# Patient Record
Sex: Female | Born: 1985 | Race: White | Hispanic: No | Marital: Married | Smoking: Former smoker
Health system: Southern US, Community
[De-identification: ages and names within clinical notes are randomized; demographics above are authoritative.]

## PROBLEM LIST (undated history)

## (undated) ENCOUNTER — Inpatient Hospital Stay (HOSPITAL_COMMUNITY): Payer: Self-pay

## (undated) ENCOUNTER — Emergency Department (HOSPITAL_COMMUNITY): Payer: 59

## (undated) ENCOUNTER — Inpatient Hospital Stay (HOSPITAL_COMMUNITY): Payer: Managed Care, Other (non HMO)

## (undated) DIAGNOSIS — Z87898 Personal history of other specified conditions: Secondary | ICD-10-CM

## (undated) DIAGNOSIS — Z800 Family history of malignant neoplasm of digestive organs: Secondary | ICD-10-CM

## (undated) DIAGNOSIS — Z9221 Personal history of antineoplastic chemotherapy: Secondary | ICD-10-CM

## (undated) DIAGNOSIS — T4145XA Adverse effect of unspecified anesthetic, initial encounter: Secondary | ICD-10-CM

## (undated) DIAGNOSIS — K589 Irritable bowel syndrome without diarrhea: Secondary | ICD-10-CM

## (undated) DIAGNOSIS — G44009 Cluster headache syndrome, unspecified, not intractable: Secondary | ICD-10-CM

## (undated) DIAGNOSIS — T8859XA Other complications of anesthesia, initial encounter: Secondary | ICD-10-CM

## (undated) DIAGNOSIS — Z853 Personal history of malignant neoplasm of breast: Secondary | ICD-10-CM

## (undated) DIAGNOSIS — E0590 Thyrotoxicosis, unspecified without thyrotoxic crisis or storm: Secondary | ICD-10-CM

## (undated) DIAGNOSIS — Z803 Family history of malignant neoplasm of breast: Secondary | ICD-10-CM

## (undated) DIAGNOSIS — N61 Mastitis without abscess: Secondary | ICD-10-CM

## (undated) DIAGNOSIS — M329 Systemic lupus erythematosus, unspecified: Secondary | ICD-10-CM

## (undated) DIAGNOSIS — C801 Malignant (primary) neoplasm, unspecified: Secondary | ICD-10-CM

## (undated) HISTORY — PX: CHOLECYSTECTOMY: SHX55

## (undated) HISTORY — DX: Family history of malignant neoplasm of breast: Z80.3

## (undated) HISTORY — DX: Systemic lupus erythematosus, unspecified: M32.9

## (undated) HISTORY — DX: Thyrotoxicosis, unspecified without thyrotoxic crisis or storm: E05.90

## (undated) HISTORY — DX: Family history of malignant neoplasm of digestive organs: Z80.0

## (undated) NOTE — *Deleted (*Deleted)
Patient Care Team: Sasser, Clarene Critchley, MD as PCP - General (Family Medicine) Glenna Fellows, MD as Consulting Physician (Plastic Surgery) Axel Filler, Larna Daughters, NP as Nurse Practitioner (Hematology and Oncology) Serena Croissant, MD as Consulting Physician (Hematology and Oncology) Emelia Loron, MD as Consulting Physician (General Surgery)  DIAGNOSIS: No diagnosis found.  SUMMARY OF ONCOLOGIC HISTORY: Oncology History  Malignant neoplasm of upper-inner quadrant of right breast in female, estrogen receptor negative (HCC)  02/01/2018 Initial Diagnosis   Screening detected right breast mass in the upper inner quadrant measuring 1.5 cm, with additional enhancement it measured 2.2 cm.  No axillary lymph nodes.  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  The Hereditary Gene Panel offered by [facility] includes sequencing and/or deletion duplication testing of the following 47 genes: APC, ATM, AXIN2, BARD1, BMPR1A, BRCA1, BRCA2, BRIP1, CDH1, CDK4, CDKN2A (p14ARF), CDKN2A (p16INK4a), CHEK2, CTNNA1, DICER1, EPCAM (Deletion/duplication testing only), GREM1 (promoter region deletion/duplication testing only), KIT, MEN1, MLH1, MSH2, MSH3, MSH6, MUTYH, NBN, NF1, NHTL1, PALB2, PDGFRA, PMS2, POLD1, POLE, PTEN, RAD50, RAD51C, RAD51D, SDHB, SDHC, SDHD, SMAD4, SMARCA4. STK11, TP53, TSC1, TSC2, and VHL.  The following genes were evaluated for sequence changes only: SDHA and HOXB13 c.251G>A variant only. The report date is February 20, 2018.    02/20/2018 - 07/05/2018 Neo-Adjuvant Chemotherapy   Neoadjuvant dose dense Adriamycin and Cytoxan x4 followed by Taxol and carboplatin (received 8 cycles of Taxol and 3 cycles of carboplatin)   07/29/2018 Surgery   Bilateral mastectomies: Right  mastectomy benign, 0/6 lymph nodes; left mastectomy: Benign, pathologic complete response   07/22/2019 Surgery   Total hysterectomy and bilateral salpingoophorectomy     CHIEF COMPLIANT: Follow-up of triple negative right breast cancer  INTERVAL HISTORY: [name] is a 53 y.o. with above-mentioned history of triple negative right breast cancer who completed neoadjuvant chemotherapy and underwent bilateral mastectomies with reconstruction. She experienced focal pain and pulling in her upper left breast. [place] on 10/15/20 showed no evidence of malignancy. She presents to the clinic today for follow-up.   ALLERGIES:  is allergic to lidocaine.  MEDICATIONS:  Current Outpatient Medications  Medication Sig Dispense Refill  . estradiol (CLIMARA - DOSED IN MG/24 HR) 0.025 mg/24hr patch Place 1 patch (0.025 mg total) onto the skin once a week. 4 patch 12  . venlafaxine XR (EFFEXOR-XR) 37.5 MG 24 hr capsule TAKE 1 CAPSULE(37.5 MG) BY MOUTH TWICE DAILY 60 capsule 1   No current facility-administered medications for this visit.    PHYSICAL EXAMINATION: ECOG PERFORMANCE STATUS: {CHL ONC ECOG PS:[id number]}  There were no vitals filed for this visit. There were no vitals filed for this visit.  LABORATORY DATA:  I have reviewed the data as listed CMP Latest Ref Rng & Units 07/22/2020 07/21/2019 02/13/2019  Glucose 70 - 99 mg/dL 83 161(W) 80  BUN 6 - 20 mg/dL 9 9 10   Creatinine 0.44 - 1.00 mg/dL 9.60 4.54 0.98  Sodium 135 - 145 mmol/L 141 140 141  Potassium 3.5 - 5.1 mmol/L 3.8 4.1 3.4(L)  Chloride 98 - 111 mmol/L 105 105 103  CO2 22 - 32 mmol/L 28 28 30   Calcium 8.9 - 10.3 mg/dL 11.9 9.5 9.5  Total Protein 6.5 - 8.1 g/dL 7.0 - 7.8  Total Bilirubin 0.3 - 1.2 mg/dL 0.5 -  0.3  Alkaline Phos 38 - 126 U/L 70 - 74  AST 15 - 41 U/L 14(L) - 12(L)  ALT 0 - 44 U/L 13 - 8    Lab Results  Component Value Date   WBC 5.4 07/22/2020   HGB 12.6 07/22/2020   HCT 37.6 07/22/2020   MCV 88.7  07/22/2020   PLT 224 07/22/2020   NEUTROABS 3.2 07/22/2020    ASSESSMENT & PLAN:  No problem-specific Assessment & Plan notes found for this encounter.    No orders of the defined types were placed in this encounter.  The patient has a good understanding of the overall plan. she agrees with it. she will call with any problems that may develop before the next visit here.  Total time spent: *** mins including face to face time and time spent for planning, charting and coordination of care  Serena Croissant, MD 10/25/2020  I, Kirt Boys Dorshimer, am acting as scribe for Dr. Serena Croissant.  {insert scribe attestation}

---

## 2011-06-01 DIAGNOSIS — R5381 Other malaise: Secondary | ICD-10-CM | POA: Insufficient documentation

## 2011-06-01 DIAGNOSIS — H8110 Benign paroxysmal vertigo, unspecified ear: Secondary | ICD-10-CM | POA: Insufficient documentation

## 2011-10-11 DIAGNOSIS — S335XXA Sprain of ligaments of lumbar spine, initial encounter: Secondary | ICD-10-CM | POA: Insufficient documentation

## 2011-10-11 DIAGNOSIS — R300 Dysuria: Secondary | ICD-10-CM | POA: Insufficient documentation

## 2011-12-20 DIAGNOSIS — J111 Influenza due to unidentified influenza virus with other respiratory manifestations: Secondary | ICD-10-CM | POA: Insufficient documentation

## 2012-01-10 DIAGNOSIS — S239XXA Sprain of unspecified parts of thorax, initial encounter: Secondary | ICD-10-CM | POA: Insufficient documentation

## 2012-01-10 DIAGNOSIS — G43919 Migraine, unspecified, intractable, without status migrainosus: Secondary | ICD-10-CM | POA: Insufficient documentation

## 2012-01-10 DIAGNOSIS — S339XXA Sprain of unspecified parts of lumbar spine and pelvis, initial encounter: Secondary | ICD-10-CM | POA: Insufficient documentation

## 2012-03-29 DIAGNOSIS — R1013 Epigastric pain: Secondary | ICD-10-CM | POA: Insufficient documentation

## 2012-03-29 DIAGNOSIS — R110 Nausea: Secondary | ICD-10-CM | POA: Insufficient documentation

## 2012-12-09 DIAGNOSIS — N644 Mastodynia: Secondary | ICD-10-CM | POA: Insufficient documentation

## 2013-05-02 LAB — OB RESULTS CONSOLE HEPATITIS B SURFACE ANTIGEN: Hepatitis B Surface Ag: NEGATIVE

## 2013-05-02 LAB — OB RESULTS CONSOLE RPR: RPR: NONREACTIVE

## 2013-05-02 LAB — OB RESULTS CONSOLE ANTIBODY SCREEN: Antibody Screen: NEGATIVE

## 2013-05-13 ENCOUNTER — Inpatient Hospital Stay (HOSPITAL_COMMUNITY): Admission: AD | Admit: 2013-05-13 | Payer: Self-pay | Source: Ambulatory Visit | Admitting: Obstetrics and Gynecology

## 2013-08-20 ENCOUNTER — Other Ambulatory Visit: Payer: Self-pay

## 2013-08-20 ENCOUNTER — Observation Stay (HOSPITAL_COMMUNITY)
Admission: AD | Admit: 2013-08-20 | Discharge: 2013-08-22 | Disposition: A | Discharge status: Home / Self Care | Payer: Managed Care, Other (non HMO) | Source: Ambulatory Visit | Attending: Obstetrics and Gynecology | Admitting: Obstetrics and Gynecology

## 2013-08-20 ENCOUNTER — Encounter (HOSPITAL_COMMUNITY): Payer: Self-pay | Admitting: *Deleted

## 2013-08-20 DIAGNOSIS — O30049 Twin pregnancy, dichorionic/diamniotic, unspecified trimester: Secondary | ICD-10-CM | POA: Insufficient documentation

## 2013-08-20 DIAGNOSIS — O30009 Twin pregnancy, unspecified number of placenta and unspecified number of amniotic sacs, unspecified trimester: Secondary | ICD-10-CM | POA: Insufficient documentation

## 2013-08-20 DIAGNOSIS — R000 Tachycardia, unspecified: Secondary | ICD-10-CM | POA: Insufficient documentation

## 2013-08-20 DIAGNOSIS — E876 Hypokalemia: Secondary | ICD-10-CM | POA: Insufficient documentation

## 2013-08-20 DIAGNOSIS — O99891 Other specified diseases and conditions complicating pregnancy: Principal | ICD-10-CM | POA: Insufficient documentation

## 2013-08-20 DIAGNOSIS — R002 Palpitations: Secondary | ICD-10-CM | POA: Diagnosis present

## 2013-08-20 DIAGNOSIS — I499 Cardiac arrhythmia, unspecified: Secondary | ICD-10-CM | POA: Insufficient documentation

## 2013-08-20 LAB — CBC
HCT: 32.1 % — ABNORMAL LOW (ref 36.0–46.0)
Hemoglobin: 11.2 g/dL — ABNORMAL LOW (ref 12.0–15.0)
MCH: 30.4 pg (ref 26.0–34.0)
MCHC: 34.9 g/dL (ref 30.0–36.0)
MCV: 87 fL (ref 78.0–100.0)
Platelets: 216 10*3/uL (ref 150–400)
RBC: 3.69 MIL/uL — ABNORMAL LOW (ref 3.87–5.11)
RDW: 14.3 % (ref 11.5–15.5)
WBC: 18.5 10*3/uL — ABNORMAL HIGH (ref 4.0–10.5)

## 2013-08-20 LAB — COMPREHENSIVE METABOLIC PANEL
ALT: 17 U/L (ref 0–35)
AST: 17 U/L (ref 0–37)
Albumin: 2.8 g/dL — ABNORMAL LOW (ref 3.5–5.2)
Alkaline Phosphatase: 64 U/L (ref 39–117)
BUN: 5 mg/dL — ABNORMAL LOW (ref 6–23)
CO2: 24 mEq/L (ref 19–32)
Calcium: 10 mg/dL (ref 8.4–10.5)
Chloride: 100 mEq/L (ref 96–112)
Creatinine, Ser: 0.43 mg/dL — ABNORMAL LOW (ref 0.50–1.10)
GFR calc Af Amer: >90 mL/min (ref >90)
GFR calc non Af Amer: >90 mL/min (ref >90)
Glucose, Bld: 82 mg/dL (ref 70–99)
Potassium: 3 mEq/L — ABNORMAL LOW (ref 3.5–5.1)
Sodium: 137 mEq/L (ref 135–145)
Total Bilirubin: 0.2 mg/dL — ABNORMAL LOW (ref 0.3–1.2)
Total Protein: 5.7 g/dL — ABNORMAL LOW (ref 6.0–8.3)

## 2013-08-20 LAB — URINALYSIS, ROUTINE W REFLEX MICROSCOPIC
Bilirubin Urine: NEGATIVE
Glucose, UA: NEGATIVE mg/dL
Hgb urine dipstick: NEGATIVE
Ketones, ur: NEGATIVE mg/dL
Leukocytes, UA: NEGATIVE
Nitrite: NEGATIVE
Protein, ur: NEGATIVE mg/dL
Specific Gravity, Urine: 1.01 (ref 1.005–1.030)
Urobilinogen, UA: 0.2 mg/dL (ref 0.0–1.0)
pH: 7 (ref 5.0–8.0)

## 2013-08-20 MED ORDER — SODIUM CHLORIDE 0.9 % IJ SOLN
3.0000 mL | Freq: Two times a day (BID) | INTRAMUSCULAR | Status: DC
  Administered 2013-08-21: 3 mL via INTRAVENOUS

## 2013-08-20 MED ORDER — ACETAMINOPHEN 325 MG PO TABS
650.0000 mg | ORAL_TABLET | ORAL | Status: DC | PRN
  Administered 2013-08-21: 650 mg via ORAL
  Filled 2013-08-20: qty 2

## 2013-08-20 MED ORDER — SODIUM CHLORIDE 0.9 % IJ SOLN: 3.0000 mL | INTRAMUSCULAR | Status: DC | PRN

## 2013-08-20 MED ORDER — SODIUM CHLORIDE 0.9 % IV SOLN: 250.0000 mL | INTRAVENOUS | Status: DC | PRN

## 2013-08-20 NOTE — MAU Provider Note (Signed)
  History     CSN: [id number]  Arrival date and time: 08/20/13 2153 Provider here to see patient @ 2240    Chief Complaint  Patient presents with  . Irregular Heart Beat   HPI  Complaint of heartburn not responsive to Zantac 75mg  today Palpitations with rapid heart rate this evening  Reports hx palpitations with gallbladder removal when potassium low States been having episodes of palpitations in pregnancy not reported to provider  Past Medical History  Diagnosis Date  . Heart palpitations     Past Surgical History  Procedure Laterality Date  . Cholecystectomy    . Cesarean section      Family History  Problem Relation Age of Onset  . Cancer Paternal Aunt     breast ca  . Heart disease Maternal Grandfather     Heart attack    History  Substance Use Topics  . Smoking status: Never Smoker   . Smokeless tobacco: Not on file  . Alcohol Use: No    Allergies: No Known Allergies  Prescriptions prior to admission  Medication Sig Dispense Refill  . Prenatal Vit-Fe Fumarate-FA (MULTIVITAMIN-PRENATAL) 27-0.8 MG TABS tablet Take 1 tablet by mouth daily at 12 noon.      . ranitidine (ZANTAC) 75 MG tablet Take 75 mg by mouth 2 (two) times daily.        ROS  Palpitations No SOB or chest pain Heartburn and reflux symptoms unresponsive to antacids No syncope or dizziness no nausea / no vomiting  Physical Exam   Blood pressure 115/71, pulse 104, temperature 99.1 F (37.3 C), resp. rate 18, height 5' (1.524 m), weight 69.4 kg (153 lb), SpO2 99.00%.  Physical Exam Alert and oriented / NAD  Heart irregular rate and rhythm  Abdomen soft and non-tender / uterus gravid (twins)  MAU Course  Procedures  EKG : sinus rhythm with PACs and incomplete right bundle branch block   Assessment and Plan  24.[redacted] weeks pregnant Twins di-di Palpitations and irregular heart rate Symptomatic Maternal Tachyarrhythmia   Dr Billy Coast updated - orders received  1) admit 2)  telemetry - Intensivist to review remote access tonight - call if intervention indicated tonight 3) labs : cbc / cmp / TSH / urinalysis 4) IV access - saline lock 5) continue EFM 5) cardiology consult in AM unless recommended by Intensivist Will supplement with PO K due to hypokalemia. Cardio consult and Echo   Marlinda Mike 08/20/2013, 10:35 PM

## 2013-08-20 NOTE — H&P (Signed)
  OB ADMISSION/ HISTORY & PHYSICAL:  Admission Date: 08/20/2013  9:54 PM  Admit Diagnosis: 24.[redacted] weeks gestation / Di-Di twins / irregular maternal heart rate / palpitations  [name] is a 27 y.o. female presenting for irregular heart rate / palpitations / heartburn.  Prenatal History: G2P1000   EDC : 12/08/2013, Alternate EDD Entry  Prenatal care at [facility]  Primary Ob Provider: Dr Billy Coast Prenatal course complicated by twin gestation (di-di) / previous CS  Medical / Surgical History :  Past medical history:  Past Medical History  Diagnosis Date  . Heart palpitations      Past surgical history:  Past Surgical History  Procedure Laterality Date  . Cholecystectomy    . Cesarean section      Family History:  Family History  Problem Relation Age of Onset  . Cancer Paternal Aunt     breast ca  . Heart disease Maternal Grandfather     Heart attack     Social History:  reports that she has never smoked. She does not have any smokeless tobacco history on file. She reports that she does not drink alcohol or use illicit drugs.  Allergies: Review of patient's allergies indicates no known allergies.   Current Medications at time of admission:  Prior to Admission medications   Medication Sig Start Date End Date Taking? Authorizing Provider  Prenatal Vit-Fe Fumarate-FA (MULTIVITAMIN-PRENATAL) 27-0.8 MG TABS tablet Take 1 tablet by mouth daily at 12 noon.   Yes Historical Provider, MD  ranitidine (ZANTAC) 75 MG tablet Take 75 mg by mouth 2 (two) times daily.   Yes Historical Provider, MD    Review of Systems: Active FM No labor signs No chest pain / syncope / dizziness + reflux Rapid heart rate  Physical Exam:  VS: Blood pressure 115/71, pulse 97, temperature 99.1 F (37.3 C), resp. rate 18, height 5' (1.524 m), weight 69.4 kg (153 lb), SpO2 98.00%.  General: alert and oriented, appears uncomfortable Heart: RRR Lungs: Clear lung  fields Abdomen: Gravid, soft and non-tender, non-distended / uterus: gravid with twins Extremities: no edema  Genitalia / VE:  deferred  FHR A present FHR B present  Assessment: 24.[redacted] weeks gestation FHR category 1  Palpitations with irregular maternal heart rate   Plan:  admit Dr Billy Coast notified of admission / plan of care  Dr Lowella Bandy - neonatology on-call - ok for admission no impending labor / delivery                         - high NICU census   [place] CNM, MSN, [facility] 08/20/2013, 11:13 PM

## 2013-08-20 NOTE — MAU Note (Signed)
Pt reports heart palpitations all day today, has had these off and on but don't seem to be going away today.

## 2013-08-21 LAB — MRSA PCR SCREENING: MRSA by PCR: NEGATIVE

## 2013-08-21 LAB — TSH: TSH: 0.304 u[IU]/mL — ABNORMAL LOW (ref 0.350–4.500)

## 2013-08-21 MED ORDER — POTASSIUM CHLORIDE CRYS ER 20 MEQ PO TBCR
20.0000 meq | EXTENDED_RELEASE_TABLET | Freq: Two times a day (BID) | ORAL | Status: DC
  Administered 2013-08-21 – 2013-08-22 (×3): 20 meq via ORAL
  Filled 2013-08-21 (×3): qty 1

## 2013-08-21 MED ORDER — POTASSIUM CHLORIDE CRYS ER 20 MEQ PO TBCR
20.0000 meq | EXTENDED_RELEASE_TABLET | Freq: Once | ORAL | Status: AC
  Administered 2013-08-21: 20 meq via ORAL
  Filled 2013-08-21: qty 1

## 2013-08-21 MED ORDER — LACTATED RINGERS IV SOLN
INTRAVENOUS | Status: DC
  Administered 2013-08-21: via INTRAVENOUS

## 2013-08-21 NOTE — Progress Notes (Signed)
Patient ID: [name], female   DOB: 08/19/1986, 27 y.o.   MRN: [id number] HD#[id number] S: Still with symptomatic palpitations. Notes especially post prandial Reports being seen in ER once pre pregnant with diagnosis of hypokalemia No workup done. Good FM No contractions, bleeding or LOF. No SOB or CP.  O: BP 106/73  Pulse 93  Temp(Src) 98.7 F (37.1 C) (Oral)  Resp 19  Ht 5' (1.524 m)  Wt 68.312 kg (150 lb 9.6 oz)  BMI 29.41 kg/m2  SpO2 97% NCAT Neck: supple with FROM Lungs: CTA CV: irregular rate and rhythm Abd: gravid and non tender No vaginal bleeding Ext: neg C/c/e Neuro: non focal  Skin: intact  Reassuring twin NST noted. No contractions CMP     Component Value Date/Time   NA 137 08/20/2013 2240   K 3.0* 08/20/2013 2240   CL 100 08/20/2013 2240   CO2 24 08/20/2013 2240   GLUCOSE 82 08/20/2013 2240   BUN 5* 08/20/2013 2240   CREATININE 0.43* 08/20/2013 2240   CALCIUM 10.0 08/20/2013 2240   PROT 5.7* 08/20/2013 2240   ALBUMIN 2.8* 08/20/2013 2240   AST 17 08/20/2013 2240   ALT 17 08/20/2013 2240   ALKPHOS 64 08/20/2013 2240   BILITOT 0.2* 08/20/2013 2240   GFRNONAA >90 08/20/2013 2240   GFRAA >90 08/20/2013 2240   Nl Ca++, Nl Mg CBC    Component Value Date/Time   WBC 18.5* 08/20/2013 2240   RBC 3.69* 08/20/2013 2240   HGB 11.2* 08/20/2013 2240   HCT 32.1* 08/20/2013 2240   PLT 216 08/20/2013 2240   MCV 87.0 08/20/2013 2240   MCH 30.4 08/20/2013 2240   MCHC 34.9 08/20/2013 2240   RDW 14.3 08/20/2013 2240    IMP: 24 3/7 weeks DIDI twin gestation New onset Maternal Tachyarrhythmia-symptomatic. ? Hypokalemia etx.  Plan: Dr. Katrinka Blazing called for cardio consult K supplementation NST q shift Echo DC per cards. Rpt labs later today

## 2013-08-21 NOTE — Progress Notes (Signed)
  Echocardiogram 2D Echocardiogram has been performed.  [name] 08/21/2013, 2:41 PM

## 2013-08-21 NOTE — Progress Notes (Signed)
Sleeping soundly, not interrupting sleep for BP as per order to respect sleep, Tele cont. To be NSR w/ PAC's and occ. PVC

## 2013-08-21 NOTE — Progress Notes (Signed)
UR chart review completed.  

## 2013-08-21 NOTE — Consult Note (Signed)
Admit date: 08/20/2013 Referring Physician  Jamie Coast, MD Primary Physician  Jamie Coast, MD Primary Cardiologist  None Reason for Consultation  Palpitations  ASSESSMENT: 1. Palpitations with PAC's, abberently conducted PAC's, and PVC's 2. Pregnancy 3. Hypokalemia  PLAN:  1. Replete potassium. May need as much as 120 meq(total) to replete. 2. No specific therapy for premature beats 3. Echocardiogram to r/o structural/functional abnormality(done. See below)   HPI: Palpitations that cause chest pressure and dyspnea. Had this once before when her potassium was low and told it was related to her gall bladder. She has no h/o heart disease. He denies murmur, edema, syncope, tachycardia, and chronic medical problems.   PMH:   Past Medical History  Diagnosis Date  . Heart palpitations      PSH:   Past Surgical History  Procedure Laterality Date  . Cholecystectomy    . Cesarean section      Allergies:  Review of patient's allergies indicates no known allergies. Prior to Admit Meds:   Prescriptions prior to admission  Medication Sig Dispense Refill  . Prenatal Vit-Fe Fumarate-FA (MULTIVITAMIN-PRENATAL) 27-0.8 MG TABS tablet Take 1 tablet by mouth daily at 12 noon.      . ranitidine (ZANTAC) 75 MG tablet Take 75 mg by mouth 2 (two) times daily.       Fam HX:    Family History  Problem Relation Age of Onset  . Cancer Paternal Aunt     breast ca  . Heart disease Maternal Grandfather     Heart attack   Social HX:    History   Social History  . Marital Status: Married    Spouse Name: N/A    Number of Children: N/A  . Years of Education: N/A   Occupational History  . Not on file.   Social History Main Topics  . Smoking status: Never Smoker   . Smokeless tobacco: Not on file  . Alcohol Use: No  . Drug Use: No  . Sexual Activity: Not Currently   Other Topics Concern  . Not on file   Social History Narrative  . No narrative on file     Review of Systems: No headache,  neurological complaints, or nausea/vomiting.   Physical Exam: Blood pressure 96/65, pulse 114, temperature 98.6 F (37 C), temperature source Oral, resp. rate 20, height 5' (1.524 m), weight 68.312 kg (150 lb 9.6 oz), SpO2 98.00%. Weight change:   Skin with no cyanosis. No clubbing noted. Neck with no JVD. Chest is clear to auscultation and percussion Cardiac exam reveals a soft 1/6 systolic murmur at the LUSB. No diastolic murmur heard. Abdomen shows findings of pregnancy. Extremities with no edema. Neuro intact  Labs:   Lab Results  Component Value Date   WBC 18.5* 08/20/2013   HGB 11.2* 08/20/2013   HCT 32.1* 08/20/2013   MCV 87.0 08/20/2013   PLT 216 08/20/2013    Recent Labs Lab 08/20/13 2240  NA 137  K 3.0*  CL 100  CO2 24  BUN 5*  CREATININE 0.43*  CALCIUM 10.0  PROT 5.7*  BILITOT 0.2*  ALKPHOS 64  ALT 17  AST 17  GLUCOSE 82   TSH : .304  Radiology:  No results found.  EKG:  NSR with PAC's and IRBBB  ECHOCARDIOGRAM: 08/21/13 ------------------------------------------------------------ LV EF: 55% - 60%  ------------------------------------------------------------ Indications: Palpitations 785.1.  ------------------------------------------------------------ History: PMH: No prior cardiac history.  ------------------------------------------------------------ Study Conclusions  Left ventricle: The cavity size was normal. Systolic function was normal. The estimated  ejection fraction was in the range of 55% to 60%. Wall motion was normal; there were no regional wall motion abnormalities. Left ventricular diastolic function parameters were normal.  No evidence ASD, RV enlargement or MV disease.  [name] 08/21/2013 11:57 AM

## 2013-08-22 LAB — T4, FREE: Free T4: 0.85 ng/dL (ref 0.80–1.80)

## 2013-08-22 LAB — POTASSIUM: Potassium: 3.4 mEq/L — ABNORMAL LOW (ref 3.5–5.1)

## 2013-08-22 MED ORDER — POTASSIUM CHLORIDE CRYS ER 20 MEQ PO TBCR
40.0000 meq | EXTENDED_RELEASE_TABLET | Freq: Once | ORAL | Status: AC
  Administered 2013-08-22: 40 meq via ORAL
  Filled 2013-08-22: qty 2

## 2013-08-22 MED ORDER — POTASSIUM CHLORIDE CRYS ER 20 MEQ PO TBCR: 20.0000 meq | EXTENDED_RELEASE_TABLET | Freq: Two times a day (BID) | ORAL | Status: DC

## 2013-08-22 NOTE — Progress Notes (Signed)
Patient ID: [name], female   DOB: 1986/06/09, 27 y.o.   MRN: [id number] HD#[id number] S: No longer with symptomatic palpitations. Notes especially post prandial Reports being seen in ER once pre pregnant with diagnosis of hypokalemia No workup done. Good FM No contractions, bleeding or LOF. No SOB or CP.  O: BP 101/62  Pulse 97  Temp(Src) 97.9 F (36.6 C) (Oral)  Resp 18  Ht 5' (1.524 m)  Wt 68.312 kg (150 lb 9.6 oz)  BMI 29.41 kg/m2  SpO2 98% NCAT Neck: supple with FROM Lungs: CTA CV: irregular rate and rhythm Abd: gravid and non tender No vaginal bleeding Ext: neg C/c/e Neuro: non focal  Skin: intact  Reassuring twin NST noted. No contractions Maternal echo nl K fu noted CMP     Component Value Date/Time   NA 137 08/20/2013 2240   K 3.4* 08/22/2013 0001   CL 100 08/20/2013 2240   CO2 24 08/20/2013 2240   GLUCOSE 82 08/20/2013 2240   BUN 5* 08/20/2013 2240   CREATININE 0.43* 08/20/2013 2240   CALCIUM 10.0 08/20/2013 2240   PROT 5.7* 08/20/2013 2240   ALBUMIN 2.8* 08/20/2013 2240   AST 17 08/20/2013 2240   ALT 17 08/20/2013 2240   ALKPHOS 64 08/20/2013 2240   BILITOT 0.2* 08/20/2013 2240   GFRNONAA >90 08/20/2013 2240   GFRAA >90 08/20/2013 2240      Nl Ca++, Nl Mg CBC    Component Value Date/Time   WBC 18.5* 08/20/2013 2240   RBC 3.69* 08/20/2013 2240   HGB 11.2* 08/20/2013 2240   HCT 32.1* 08/20/2013 2240   PLT 216 08/20/2013 2240   MCV 87.0 08/20/2013 2240   MCH 30.4 08/20/2013 2240   MCHC 34.9 08/20/2013 2240   RDW 14.3 08/20/2013 2240    IMP: 24 4/7 weeks DIDI twin gestation New onset Maternal Tachyarrhythmia-symptomatic. ? Hypokalemia etx. Improved.  Plan: Dr. Katrinka Blazing completed cardio consult K supplementation bid Echo completed DC home Rpt labs done

## 2013-08-22 NOTE — Progress Notes (Signed)
Pt discharged home w/instructions & prescription - pt questions answered.

## 2013-09-03 NOTE — Discharge Summary (Signed)
[name], [name]                ACCOUNT NO.:  [id number]  MEDICAL RECORD NO.:  [id number]  LOCATION:  9371                          FACILITY:  [facility]  PHYSICIAN:  Lenoard Aden, M.D.DATE OF BIRTH:  1986-02-01  DATE OF ADMISSION:  08/20/2013 DATE OF DISCHARGE:  08/22/2013                              DISCHARGE SUMMARY   ADMISSION DIAGNOSIS:  Symptomatic arrhythmia.  DISCHARGE DIAGNOSIS:  Symptomatic arrhythmia with secondary hypokalemia.  HOSPITAL COURSE:  The patient was admitted with aforementioned symptomatic arrhythmia with PACs and PVCs noted at 24 weeks with a twin gestation.  She was placed in the Adult Intensive Care unit for telemetry status at which point her metabolic profile revealed a potassium of 3.  She was initiated on oral potassium therapy.  Fetal monitoring was reassuring.  Cardiology consult and echocardiogram were performed.  The patient was discharged to home.  Discharge diagnosis was hypokalemia with secondary arrhythmia.  DISCHARGE INSTRUCTIONS:  She is to follow up in the office in 1 week. Discharge teaching was done.     Lenoard Aden, M.D.     RJT/MEDQ  D:  09/03/2013  T:  09/03/2013  Job:  [id number]

## 2013-09-10 ENCOUNTER — Encounter: Payer: Managed Care, Other (non HMO) | Attending: Obstetrics and Gynecology

## 2013-09-10 VITALS — Ht 60.0 in | Wt 160.1 lb

## 2013-09-10 DIAGNOSIS — Z713 Dietary counseling and surveillance: Secondary | ICD-10-CM | POA: Insufficient documentation

## 2013-09-10 DIAGNOSIS — O99810 Abnormal glucose complicating pregnancy: Secondary | ICD-10-CM | POA: Insufficient documentation

## 2013-09-10 NOTE — Patient Instructions (Signed)
Plan:  Aim for 2 Carb Choices at breakfast (30grams) +/- either way and 3 Choices at lunch and dinner (45 grams) +/- 1 either way  Aim for 0-2 Carbs per snack if hungry  Consider reading food labels for Total Carbohydrate and Fat Grams of foods Consider  increasing your activity level by walking daily as tolerated Begin checking BG at alternate times per day to include FBS and 2hpp as directed by MD

## 2013-09-10 NOTE — Progress Notes (Signed)
  Patient was seen on 09/10/13 for Gestational Diabetes self-management class at [facility]. The following learning objectives were met by the patient during this course:   States the definition of Gestational Diabetes  States why dietary management is important in controlling blood glucose  Describes the effects each nutrient has on blood glucose levels  Demonstrates ability to create a balanced meal plan  Demonstrates carbohydrate counting   States when to check blood glucose levels  Demonstrates proper blood glucose monitoring techniques  States the effect of stress and exercise on blood glucose levels  States the importance of limiting caffeine and abstaining from alcohol and smoking  Plan:  Aim for 2 Carb Choices at breakfast (30grams) +/- either way and 3 Choices at lunch and dinner (45 grams) +/- 1 either way  Aim for 0-2 Carbs per snack if hungry  Consider reading food labels for Total Carbohydrate and Fat Grams of foods Consider  increasing your activity level by walking daily as tolerated Begin checking BG at alternate times per day to include FBS and 2hpp as directed by MD  Blood glucose monitor given:  One Touch Ultra Mini Self Monitoring Kit Lot # [id number] X Exp: 07/2014 Blood glucose reading:   56mg /dl (4 1/2 hpp).Marland KitchenMarland KitchenMarland KitchenFelt dizzy.Protein bar given felt fine upon leaving.   Patient instructed to monitor glucose levels: FBS: 60 - <90 1 hour: <140 2 hour: <120  *Patient received handouts:  Nutrition Diabetes and Pregnancy  Carbohydrate Counting List  Patient will be seen for follow-up as needed.

## 2013-09-15 DIAGNOSIS — J019 Acute sinusitis, unspecified: Secondary | ICD-10-CM | POA: Insufficient documentation

## 2013-10-22 ENCOUNTER — Inpatient Hospital Stay (HOSPITAL_COMMUNITY)
Admission: AD | Admit: 2013-10-22 | Discharge: 2013-10-22 | Disposition: A | Discharge status: Home / Self Care | Payer: Managed Care, Other (non HMO) | Source: Ambulatory Visit | Attending: Obstetrics & Gynecology | Admitting: Obstetrics & Gynecology

## 2013-10-22 ENCOUNTER — Encounter (HOSPITAL_COMMUNITY): Payer: Self-pay

## 2013-10-22 DIAGNOSIS — O3660X0 Maternal care for excessive fetal growth, unspecified trimester, not applicable or unspecified: Secondary | ICD-10-CM | POA: Insufficient documentation

## 2013-10-22 DIAGNOSIS — O409XX0 Polyhydramnios, unspecified trimester, not applicable or unspecified: Secondary | ICD-10-CM | POA: Insufficient documentation

## 2013-10-22 DIAGNOSIS — O30009 Twin pregnancy, unspecified number of placenta and unspecified number of amniotic sacs, unspecified trimester: Secondary | ICD-10-CM | POA: Insufficient documentation

## 2013-10-22 DIAGNOSIS — O4700 False labor before 37 completed weeks of gestation, unspecified trimester: Secondary | ICD-10-CM | POA: Insufficient documentation

## 2013-10-22 NOTE — MAU Note (Signed)
Contractions every 5-10 minutes since yesterday afternoon. Denies LOF of VB or discharge. Positive fetal movement.

## 2013-10-22 NOTE — MAU Provider Note (Signed)
  History     CSN: [id number]  Arrival date and time: 10/22/13 2017 [facility] call to provider @ 2106 Provider here to see patient @ 2135    Chief Complaint  Patient presents with  . Contractions   HPI Cramps and irregular contractions   Past Medical History  Diagnosis Date  . Heart palpitations   . Gestational diabetes mellitus, antepartum 2014    diet controlled  . Gestational diabetes     Past Surgical History  Procedure Laterality Date  . Cholecystectomy    . Cesarean section      Family History  Problem Relation Age of Onset  . Cancer Paternal Aunt     breast ca  . Heart disease Maternal Grandfather     Heart attack    History  Substance Use Topics  . Smoking status: Never Smoker   . Smokeless tobacco: Not on file  . Alcohol Use: No    Allergies: No Known Allergies  Prescriptions prior to admission  Medication Sig Dispense Refill  . potassium chloride SA (K-DUR,KLOR-CON) 20 MEQ tablet Take 1 tablet (20 mEq total) by mouth 2 (two) times daily.  60 tablet  3  . Prenatal Vit-Fe Fumarate-FA (MULTIVITAMIN-PRENATAL) 27-0.8 MG TABS tablet Take 1 tablet by mouth daily at 12 noon.      . ranitidine (ZANTAC) 75 MG tablet Take 75 mg by mouth 2 (two) times daily.        ROS Physical Exam   Blood pressure 118/70, pulse 105, temperature 98.7 F (37.1 C), temperature source Oral, resp. rate 18, height 5' (1.524 m), weight 75.569 kg (166 lb 9.6 oz), SpO2 99.00%.  Physical Exam  VE: closed / thick / presenting part high NST reactive x 2  Toco - mild irregular ctx every 5-10 minutes MAU Course  Procedures  NST x 2 reactive  Assessment and Plan  33.2  Weeks TWINS LGA and polyhydramnios No evidence of PTL  DC home - rest - increase water hydration OV Monday as scheduled with Dr Verdene Lennert, Kenney Houseman 10/22/2013, 9:41 PM

## 2013-10-29 ENCOUNTER — Inpatient Hospital Stay (HOSPITAL_COMMUNITY)
Admission: AD | Admit: 2013-10-29 | Discharge: 2013-10-30 | Disposition: A | Discharge status: Home / Self Care | Payer: Managed Care, Other (non HMO) | Source: Ambulatory Visit | Attending: Obstetrics and Gynecology | Admitting: Obstetrics and Gynecology

## 2013-10-29 DIAGNOSIS — O30049 Twin pregnancy, dichorionic/diamniotic, unspecified trimester: Secondary | ICD-10-CM | POA: Insufficient documentation

## 2013-10-29 DIAGNOSIS — R002 Palpitations: Secondary | ICD-10-CM | POA: Insufficient documentation

## 2013-10-29 DIAGNOSIS — O30009 Twin pregnancy, unspecified number of placenta and unspecified number of amniotic sacs, unspecified trimester: Secondary | ICD-10-CM | POA: Insufficient documentation

## 2013-10-29 DIAGNOSIS — O99810 Abnormal glucose complicating pregnancy: Secondary | ICD-10-CM | POA: Insufficient documentation

## 2013-10-29 DIAGNOSIS — N949 Unspecified condition associated with female genital organs and menstrual cycle: Secondary | ICD-10-CM | POA: Insufficient documentation

## 2013-10-29 DIAGNOSIS — M549 Dorsalgia, unspecified: Secondary | ICD-10-CM | POA: Insufficient documentation

## 2013-10-29 DIAGNOSIS — O4700 False labor before 37 completed weeks of gestation, unspecified trimester: Secondary | ICD-10-CM | POA: Insufficient documentation

## 2013-10-29 DIAGNOSIS — E876 Hypokalemia: Secondary | ICD-10-CM | POA: Insufficient documentation

## 2013-10-29 LAB — URINALYSIS, ROUTINE W REFLEX MICROSCOPIC
Bilirubin Urine: NEGATIVE
Glucose, UA: NEGATIVE mg/dL
Ketones, ur: 40 mg/dL — AB
Leukocytes, UA: NEGATIVE
Nitrite: NEGATIVE
Specific Gravity, Urine: 1.015 (ref 1.005–1.030)
pH: 6.5 (ref 5.0–8.0)

## 2013-10-30 ENCOUNTER — Encounter (HOSPITAL_COMMUNITY): Payer: Self-pay | Admitting: *Deleted

## 2013-10-30 LAB — GLUCOSE, CAPILLARY

## 2013-10-30 MED ORDER — NIFEDIPINE 10 MG PO CAPS
10.0000 mg | ORAL_CAPSULE | Freq: Once | ORAL | Status: AC
Start: 2013-10-30 — End: 2013-10-30
  Administered 2013-10-30: 10 mg via ORAL
  Filled 2013-10-30: qty 1

## 2013-10-30 MED ORDER — NIFEDIPINE 10 MG PO CAPS
10.0000 mg | ORAL_CAPSULE | Freq: Once | ORAL | Status: AC
  Administered 2013-10-30: 10 mg via ORAL
  Filled 2013-10-30: qty 1

## 2013-10-30 MED ORDER — NIFEDIPINE 10 MG PO CAPS: 10.0000 mg | ORAL_CAPSULE | Freq: Three times a day (TID) | ORAL | Status: DC

## 2013-10-30 MED ORDER — ONDANSETRON 8 MG PO TBDP
8.0000 mg | ORAL_TABLET | Freq: Once | ORAL | Status: AC
  Administered 2013-10-30: 8 mg via ORAL
  Filled 2013-10-30: qty 1

## 2013-10-30 MED ORDER — NIFEDIPINE 10 MG PO CAPS: 10.0000 mg | ORAL_CAPSULE | ORAL | Status: DC

## 2013-10-30 MED ORDER — TERBUTALINE SULFATE 1 MG/ML IJ SOLN: 0.2500 mg | Freq: Once | INTRAMUSCULAR | Status: DC

## 2013-10-30 NOTE — MAU Provider Note (Signed)
History     CSN: [id number]  Arrival date and time: 10/29/13 2308 Provider notified of patient: 2355 Provider on unit and at bedside: 0035    First Provider Initiated Contact with Patient 10/30/13 0037      Chief Complaint  Patient presents with  . Labor Eval   HPI  Ms. [name] is a 27 y.o. G2P1001 female at 65.[redacted]wks gestation (Di-Di twin gestation) presenting with complaints of Braxton-Hicks  contractions since last night.  The contractions became worse and every 10 mins at about 2030.  She now has pelvic pain and mild back pain. She was seen in MAU last Thursday and in the office Monday for the same complaint with no cervical dilation.  She reports (+) FM x 2, but less  than usual.  She denies VB, LOF or any mucous plug.  Past Medical History  Diagnosis Date  . Heart palpitations   . Gestational diabetes mellitus, antepartum 2014    diet controlled  . Gestational diabetes     Past Surgical History  Procedure Laterality Date  . Cholecystectomy    . Cesarean section      Family History  Problem Relation Age of Onset  . Cancer Paternal Aunt     breast ca  . Heart disease Maternal Grandfather     Heart attack    History  Substance Use Topics  . Smoking status: Never Smoker   . Smokeless tobacco: Not on file  . Alcohol Use: No    Allergies: No Known Allergies  Prescriptions prior to admission  Medication Sig Dispense Refill  . Prenatal Vit-Fe Fumarate-FA (MULTIVITAMIN-PRENATAL) 27-0.8 MG TABS tablet Take 1 tablet by mouth daily at 12 noon.      . ranitidine (ZANTAC) 75 MG tablet Take 75 mg by mouth 2 (two) times daily.        Review of Systems  Constitutional: Negative.   HENT:       Probably since it has been a while since last meal  Eyes: Negative.   Respiratory: Negative.   Cardiovascular: Positive for palpitations.       Nothing unusual  Gastrointestinal: Positive for nausea.       Due to not eating in a long time  Genitourinary: Negative.    Musculoskeletal: Negative.   Skin: Negative.   Neurological: Positive for headaches.  Endo/Heme/Allergies: Negative.   Psychiatric/Behavioral: Negative.   VE: closed/60%/high - presenting part undetermined with VE (x 2 exams)  Results for orders placed during the hospital encounter of 10/29/13 (from the past 24 hour(s))  URINALYSIS, ROUTINE W REFLEX MICROSCOPIC     Status: Abnormal   Collection Time    10/29/13 11:11 PM      Result Value Range   Color, Urine YELLOW  YELLOW   APPearance CLEAR  CLEAR   Specific Gravity, Urine 1.015  1.005 - 1.030   pH 6.5  5.0 - 8.0   Glucose, UA NEGATIVE  NEGATIVE mg/dL   Hgb urine dipstick NEGATIVE  NEGATIVE   Bilirubin Urine NEGATIVE  NEGATIVE   Ketones, ur 40 (*) NEGATIVE mg/dL   Protein, ur NEGATIVE  NEGATIVE mg/dL   Urobilinogen, UA 0.2  0.0 - 1.0 mg/dL   Nitrite NEGATIVE  NEGATIVE   Leukocytes, UA NEGATIVE  NEGATIVE  GLUCOSE, CAPILLARY     Status: None   Collection Time    10/30/13 12:46 AM      Result Value Range   Glucose-Capillary 91  70 - 99 mg/dL   Comment 1  Call MD NNP PA CNM     Physical Exam   Blood pressure 116/66, pulse 107, temperature 98.1 F (36.7 C).  Physical Exam  Constitutional: She is oriented to person, place, and time. She appears well-developed and well-nourished.  HENT:  Head: Normocephalic.  Eyes: EOM are normal. Pupils are equal, round, and reactive to light.  Neck: Normal range of motion. Neck supple.  Cardiovascular: Normal rate, regular rhythm, normal heart sounds and intact distal pulses.   1+ edema in LE  Respiratory: Effort normal and breath sounds normal.  GI: Soft. Bowel sounds are normal.  Genitourinary: Vagina normal and uterus normal.  Gravid uterus  Musculoskeletal: Normal range of motion.  Neurological: She is alert and oriented to person, place, and time. She has normal reflexes.  Skin: Skin is warm and dry.  Psychiatric: She has a normal mood and affect. Her behavior is normal. Judgment  and thought content normal.    MAU Course  Procedures CCUA EFM Assessment and Plan  34.3wks Di-Di Twin IUP  Gestational Diabetes Class A1 Preterm contractions H/O Palpitations Hypokalemia  Procardia 10 mg po, may repeat dose in 30 mins (if still contracting) Discharge home Procardia 10mg  every 4-6 hours for contractions Follow-up with Dr. Billy Coast Thursday or Friday  *Consulted Drs. Mody & Taavon on plan of care - agrees with plan  Kenard Gower, MSN, CNM 10/30/2013, 12:45 AM

## 2013-11-01 LAB — CULTURE, BETA STREP (GROUP B ONLY)

## 2013-11-07 ENCOUNTER — Other Ambulatory Visit: Payer: Self-pay | Admitting: Obstetrics and Gynecology

## 2013-11-10 ENCOUNTER — Encounter (HOSPITAL_COMMUNITY): Payer: Self-pay

## 2013-11-17 ENCOUNTER — Encounter (HOSPITAL_COMMUNITY): Payer: Self-pay

## 2013-11-17 ENCOUNTER — Encounter (HOSPITAL_COMMUNITY)
Admit: 2013-11-17 | Discharge: 2013-11-17 | Disposition: A | Discharge status: Home / Self Care | Payer: Managed Care, Other (non HMO) | Attending: Obstetrics and Gynecology | Admitting: Obstetrics and Gynecology

## 2013-11-17 ENCOUNTER — Inpatient Hospital Stay (HOSPITAL_COMMUNITY)
Admission: AD | Admit: 2013-11-17 | Discharge: 2013-11-17 | Disposition: A | Discharge status: Home / Self Care | Payer: Managed Care, Other (non HMO) | Source: Ambulatory Visit | Attending: Obstetrics and Gynecology | Admitting: Obstetrics and Gynecology

## 2013-11-17 ENCOUNTER — Inpatient Hospital Stay (HOSPITAL_COMMUNITY): Admission: RE | Admit: 2013-11-17 | Payer: Managed Care, Other (non HMO) | Source: Ambulatory Visit

## 2013-11-17 DIAGNOSIS — O479 False labor, unspecified: Secondary | ICD-10-CM | POA: Insufficient documentation

## 2013-11-17 LAB — CBC
HCT: 39.3 % (ref 36.0–46.0)
Hemoglobin: 13.6 g/dL (ref 12.0–15.0)
MCH: 30.4 pg (ref 26.0–34.0)
RDW: 14.1 % (ref 11.5–15.5)
WBC: 16.8 10*3/uL — ABNORMAL HIGH (ref 4.0–10.5)

## 2013-11-17 LAB — BASIC METABOLIC PANEL
Creatinine, Ser: 0.49 mg/dL — ABNORMAL LOW (ref 0.50–1.10)
GFR calc Af Amer: >90 mL/min (ref >90)
GFR calc non Af Amer: >90 mL/min (ref >90)
Potassium: 3.9 mEq/L (ref 3.5–5.1)
Sodium: 135 mEq/L (ref 135–145)

## 2013-11-17 LAB — TYPE AND SCREEN
ABO/RH(D): A POS
Antibody Screen: NEGATIVE

## 2013-11-17 LAB — RPR: RPR Ser Ql: NONREACTIVE

## 2013-11-17 NOTE — Pre-Procedure Instructions (Signed)
Delaney Meigs at Dr. Jorene Minors office made aware of pts. Elevated WBC results

## 2013-11-17 NOTE — MAU Note (Signed)
Pt presents complaining of lost mucous plug and regular contractions since 6am this morning. Denies vaginal bleeding, discharge and loss of fluid.

## 2013-11-17 NOTE — Patient Instructions (Addendum)
Your procedure is scheduled on: 11/19/2013  Enter through the Main Entrance of [facility] at: 0900am  Pick up the phone at the desk and dial [phone].  Call this number if you have problems the morning of surgery: [phone].  Remember: Do NOT eat food:  Do NOT drink clear liquids after: Take these medicines the morning of surgery with a SIP OF WATER: NIFEdipine  Do NOT wear jewelry (body piercing), make-up, or nail polish. Do NOT wear lotions, powders, or perfumes.  You may wear deoderant. Do NOT shave for 48 hours prior to surgery. Do NOT bring valuables to the hospital. Contacts, dentures, or bridgework may not be worn into surgery. Leave suitcase in car.  After surgery it may be brought to your room.  For patients admitted to the hospital, checkout time is 11:00 AM the day of discharge.

## 2013-11-17 NOTE — Progress Notes (Signed)
Dr Billy Coast notified of pt's arrival and complaints, orders received to discharge home.

## 2013-11-17 NOTE — MAU Provider Note (Signed)
  History   Contractions and lost mucous plug  CSN: [id number]  Arrival date and time: 11/17/13 7564   None     Chief Complaint  Patient presents with  . Labor Eval   HPI  OB History   Grav Para Term Preterm Abortions TAB SAB Ect Mult Living   2 1 1       1       Past Medical History  Diagnosis Date  . Heart palpitations   . Gestational diabetes mellitus, antepartum 2014    diet controlled  . Gestational diabetes     Past Surgical History  Procedure Laterality Date  . Cholecystectomy    . Cesarean section      Family History  Problem Relation Age of Onset  . Cancer Paternal Aunt     breast ca  . Heart disease Maternal Grandfather     Heart attack    History  Substance Use Topics  . Smoking status: Never Smoker   . Smokeless tobacco: Not on file  . Alcohol Use: No    Allergies: No Known Allergies  Prescriptions prior to admission  Medication Sig Dispense Refill  . acetaminophen (TYLENOL) 325 MG tablet Take 650 mg by mouth as needed for mild pain or headache.      Marland Kitchen NIFEdipine (PROCARDIA) 10 MG capsule Take 10 mg by mouth as needed (has only taken three pill total).      . potassium chloride (K-DUR,KLOR-CON) 10 MEQ tablet Take 10 mEq by mouth daily.      . Prenatal Vit-Fe Fumarate-FA (MULTIVITAMIN-PRENATAL) 27-0.8 MG TABS tablet Take 1 tablet by mouth daily at 12 noon.        ROS Physical Exam   Blood pressure 114/70, pulse 87, temperature 98.2 F (36.8 C), temperature source Oral, resp. rate 18, height 5' (1.524 m), weight 75.751 kg (167 lb).  Physical Exam VE: closed and Lg per RN  MAU Course  Procedures Reactive NST A and B Irregular contractions  MDM na  Assessment and Plan  Prodromal Labor DC home  Warda Mcqueary J 11/17/2013, 7:41 AM

## 2013-11-17 NOTE — Patient Instructions (Addendum)
Your procedure is scheduled on: 11/19/2013  Enter through the Main Entrance of [facility] at: 1000am  Pick up the phone at the desk and dial [phone].  Call this number if you have problems the morning of surgery: [phone].  Remember: Do NOT eat food: AFTER MIDNIGHT 11/18/2013 Do NOT drink clear liquids after: AFTER MIDNIGHT 11/18/2013  Take these medicines the morning of surgery with a SIP OF WATER: N/A  Do NOT wear jewelry (body piercing), make-up, or nail polish. Do NOT wear lotions, powders, or perfumes.  You may wear deoderant. Do NOT shave for 48 hours prior to surgery. Do NOT bring valuables to the hospital. Contacts, dentures, or bridgework may not be worn into surgery. Leave suitcase in car.  After surgery it may be brought to your room.  For patients admitted to the hospital, checkout time is 11:00 AM the day of discharge.

## 2013-11-18 ENCOUNTER — Other Ambulatory Visit: Payer: Self-pay | Admitting: Obstetrics and Gynecology

## 2013-11-18 NOTE — H&P (Signed)
[name], [name]                ACCOUNT NO.:  [id number]  MEDICAL RECORD NO.:  [id number]  LOCATION:  PERIO                         FACILITY:  [facility]  PHYSICIAN:  Lenoard Aden, M.D.DATE OF BIRTH:  1986-07-19  DATE OF ADMISSION:  10/22/2013 DATE OF DISCHARGE:                             HISTORY & PHYSICAL   CHIEF COMPLAINT:  Previous C-section, in for repeat.  HISTORY OF PRESENT ILLNESS:  She is a 27 year old white female, G2, P1 previous C-section x1 who presents now for repeat C-section at 37 weeks with twins due to polyhydramnios, gestational diabetes, and cholestasis of pregnancy.  MEDICATIONS:  Vistaril, potassium supplementation, Procardia as needed, Zantac and prenatal vitamins.  PAST SURGICAL HISTORY:  Remarkable for cholecystectomy and cesarean delivery.  She had a previous history of C-section x1.  Surgical history otherwise noncontributory.  FAMILY HISTORY:  Remarkable for multiple sclerosis, cirrhosis, alcohol abuse, breast cancer, and drug abuse.  Prenatal course complicated as noted for polyhydramnios, cholestasis of pregnancy, and gestational diabetes.  PHYSICAL EXAMINATION:  GENERAL:  A well-developed, well-nourished white female, in no acute distress. HEENT:  Normal. NECK:  Supple.  Full range of motion. LUNGS:  Clear. HEART:  Regular rate and rhythm. ABDOMEN:  Soft, gravid, nontender.  Cervix closed, 50%, transverse, -2. EXTREMITIES:  There are no cords. NEUROLOGIC:  Nonfocal. SKIN:  Intact.  IMPRESSION: 1. A 37-week twin dichorionic diamniotic gestation. 2. Cholestasis of pregnancy. 3. Gestational diabetes. 4. Polyhydramnios.  PLAN:  Proceed with repeat C-section and tubal ligation.  Risks of anesthesia, infection, bleeding, injury to surrounding organs, need for repair was discussed.  Delayed versus immediate complications to include bowel and bladder injury noted.  Failure risk of tubal ligation 5-10% was discussed.  The patient  acknowledges and wishes to proceed.     Lenoard Aden, M.D.     RJT/MEDQ  D:  11/18/2013  T:  11/18/2013  Job:  [id number]

## 2013-11-19 ENCOUNTER — Encounter (HOSPITAL_COMMUNITY): Payer: Self-pay | Admitting: *Deleted

## 2013-11-19 ENCOUNTER — Encounter (HOSPITAL_COMMUNITY): Payer: Managed Care, Other (non HMO) | Admitting: Anesthesiology

## 2013-11-19 ENCOUNTER — Encounter (HOSPITAL_COMMUNITY)
Admission: RE | Disposition: A | Discharge status: Home / Self Care | Payer: Self-pay | Source: Ambulatory Visit | Attending: Obstetrics and Gynecology

## 2013-11-19 ENCOUNTER — Inpatient Hospital Stay (HOSPITAL_COMMUNITY): Payer: Managed Care, Other (non HMO) | Admitting: Anesthesiology

## 2013-11-19 ENCOUNTER — Inpatient Hospital Stay (HOSPITAL_COMMUNITY)
Admission: RE | Admit: 2013-11-19 | Discharge: 2013-11-21 | DRG: 765 | Disposition: A | Discharge status: Home / Self Care | Payer: Managed Care, Other (non HMO) | Source: Ambulatory Visit | Attending: Obstetrics and Gynecology | Admitting: Obstetrics and Gynecology

## 2013-11-19 DIAGNOSIS — K838 Other specified diseases of biliary tract: Secondary | ICD-10-CM | POA: Diagnosis present

## 2013-11-19 DIAGNOSIS — O30009 Twin pregnancy, unspecified number of placenta and unspecified number of amniotic sacs, unspecified trimester: Secondary | ICD-10-CM | POA: Diagnosis present

## 2013-11-19 DIAGNOSIS — O99814 Abnormal glucose complicating childbirth: Secondary | ICD-10-CM | POA: Diagnosis present

## 2013-11-19 DIAGNOSIS — Z9089 Acquired absence of other organs: Secondary | ICD-10-CM

## 2013-11-19 DIAGNOSIS — O409XX0 Polyhydramnios, unspecified trimester, not applicable or unspecified: Secondary | ICD-10-CM | POA: Diagnosis present

## 2013-11-19 DIAGNOSIS — O30049 Twin pregnancy, dichorionic/diamniotic, unspecified trimester: Secondary | ICD-10-CM

## 2013-11-19 DIAGNOSIS — O26619 Liver and biliary tract disorders in pregnancy, unspecified trimester: Secondary | ICD-10-CM | POA: Diagnosis present

## 2013-11-19 DIAGNOSIS — O34219 Maternal care for unspecified type scar from previous cesarean delivery: Principal | ICD-10-CM | POA: Diagnosis present

## 2013-11-19 LAB — GLUCOSE, CAPILLARY: Glucose-Capillary: 76 mg/dL (ref 70–99)

## 2013-11-19 SURGERY — Surgical Case
Anesthesia: Spinal | Site: Abdomen | Laterality: Bilateral | Wound class: Clean Contaminated

## 2013-11-19 MED ORDER — METHYLERGONOVINE MALEATE 0.2 MG/ML IJ SOLN: 0.2000 mg | INTRAMUSCULAR | Status: DC | PRN

## 2013-11-19 MED ORDER — NALBUPHINE SYRINGE 5 MG/0.5 ML
5.0000 mg | INJECTION | INTRAMUSCULAR | Status: DC | PRN
  Filled 2013-11-19: qty 1

## 2013-11-19 MED ORDER — CEFAZOLIN SODIUM-DEXTROSE 2-3 GM-% IV SOLR
INTRAVENOUS | Status: AC
  Filled 2013-11-19: qty 50

## 2013-11-19 MED ORDER — ACETAMINOPHEN 500 MG PO TABS
1000.0000 mg | ORAL_TABLET | Freq: Four times a day (QID) | ORAL | Status: AC
  Administered 2013-11-19 – 2013-11-20 (×3): 1000 mg via ORAL
  Filled 2013-11-19 (×3): qty 2

## 2013-11-19 MED ORDER — KETOROLAC TROMETHAMINE 30 MG/ML IJ SOLN
INTRAMUSCULAR | Status: AC
  Administered 2013-11-19: 30 mg via INTRAMUSCULAR
  Filled 2013-11-19: qty 1

## 2013-11-19 MED ORDER — FENTANYL CITRATE 0.05 MG/ML IJ SOLN
25.0000 ug | INTRAMUSCULAR | Status: DC | PRN
  Administered 2013-11-19 (×2): 50 ug via INTRAVENOUS

## 2013-11-19 MED ORDER — IBUPROFEN 600 MG PO TABS
600.0000 mg | ORAL_TABLET | Freq: Four times a day (QID) | ORAL | Status: DC
  Administered 2013-11-19 – 2013-11-21 (×7): 600 mg via ORAL
  Filled 2013-11-19 (×7): qty 1

## 2013-11-19 MED ORDER — PHENYLEPHRINE 8 MG IN D5W 100 ML (0.08MG/ML) PREMIX OPTIME
INJECTION | INTRAVENOUS | Status: DC | PRN
  Administered 2013-11-19: 60 ug/min via INTRAVENOUS

## 2013-11-19 MED ORDER — ZOLPIDEM TARTRATE 5 MG PO TABS: 5.0000 mg | ORAL_TABLET | Freq: Every evening | ORAL | Status: DC | PRN

## 2013-11-19 MED ORDER — ONDANSETRON HCL 4 MG PO TABS
4.0000 mg | ORAL_TABLET | ORAL | Status: DC | PRN
  Administered 2013-11-20: 4 mg via ORAL
  Filled 2013-11-19: qty 1

## 2013-11-19 MED ORDER — ONDANSETRON HCL 4 MG/2ML IJ SOLN: 4.0000 mg | INTRAMUSCULAR | Status: DC | PRN

## 2013-11-19 MED ORDER — OXYTOCIN 10 UNIT/ML IJ SOLN
40.0000 [IU] | INTRAVENOUS | Status: DC | PRN
  Administered 2013-11-19: 40 [IU] via INTRAVENOUS

## 2013-11-19 MED ORDER — PROMETHAZINE HCL 25 MG/ML IJ SOLN: 6.2500 mg | INTRAMUSCULAR | Status: DC | PRN

## 2013-11-19 MED ORDER — FENTANYL CITRATE 0.05 MG/ML IJ SOLN
INTRAMUSCULAR | Status: DC | PRN
  Administered 2013-11-19: 15 ug via INTRATHECAL

## 2013-11-19 MED ORDER — DEXAMETHASONE SODIUM PHOSPHATE 4 MG/ML IJ SOLN
INTRAMUSCULAR | Status: DC | PRN
  Administered 2013-11-19: 10 mg via INTRAVENOUS

## 2013-11-19 MED ORDER — SCOPOLAMINE 1 MG/3DAYS TD PT72
1.0000 | MEDICATED_PATCH | Freq: Once | TRANSDERMAL | Status: DC
  Administered 2013-11-19: 1.5 mg via TRANSDERMAL

## 2013-11-19 MED ORDER — SCOPOLAMINE 1 MG/3DAYS TD PT72: 1.0000 | MEDICATED_PATCH | Freq: Once | TRANSDERMAL | Status: DC

## 2013-11-19 MED ORDER — MORPHINE SULFATE (PF) 0.5 MG/ML IJ SOLN
INTRAMUSCULAR | Status: DC | PRN
  Administered 2013-11-19: .1 mg via INTRATHECAL

## 2013-11-19 MED ORDER — DIBUCAINE 1 % RE OINT: 1.0000 "application " | TOPICAL_OINTMENT | RECTAL | Status: DC | PRN

## 2013-11-19 MED ORDER — SENNOSIDES-DOCUSATE SODIUM 8.6-50 MG PO TABS
2.0000 | ORAL_TABLET | ORAL | Status: DC
  Administered 2013-11-19 – 2013-11-21 (×2): 2 via ORAL
  Filled 2013-11-19 (×2): qty 2

## 2013-11-19 MED ORDER — PRENATAL MULTIVITAMIN CH
1.0000 | ORAL_TABLET | Freq: Every day | ORAL | Status: DC
  Administered 2013-11-20: 1 via ORAL
  Filled 2013-11-19: qty 1

## 2013-11-19 MED ORDER — IBUPROFEN 600 MG PO TABS: 600.0000 mg | ORAL_TABLET | Freq: Four times a day (QID) | ORAL | Status: DC | PRN

## 2013-11-19 MED ORDER — SODIUM CHLORIDE 0.9 % IJ SOLN: 3.0000 mL | INTRAMUSCULAR | Status: DC | PRN

## 2013-11-19 MED ORDER — MEPERIDINE HCL 25 MG/ML IJ SOLN: 6.2500 mg | INTRAMUSCULAR | Status: DC | PRN

## 2013-11-19 MED ORDER — DIPHENHYDRAMINE HCL 50 MG/ML IJ SOLN: 12.5000 mg | INTRAMUSCULAR | Status: DC | PRN

## 2013-11-19 MED ORDER — TETANUS-DIPHTH-ACELL PERTUSSIS 5-2.5-18.5 LF-MCG/0.5 IM SUSP
0.5000 mL | Freq: Once | INTRAMUSCULAR | Status: AC
  Administered 2013-11-20: 0.5 mL via INTRAMUSCULAR
  Filled 2013-11-19: qty 0.5

## 2013-11-19 MED ORDER — SCOPOLAMINE 1 MG/3DAYS TD PT72
MEDICATED_PATCH | TRANSDERMAL | Status: AC
  Administered 2013-11-19: 1.5 mg via TRANSDERMAL
  Filled 2013-11-19: qty 1

## 2013-11-19 MED ORDER — OXYCODONE-ACETAMINOPHEN 5-325 MG PO TABS
1.0000 | ORAL_TABLET | ORAL | Status: DC | PRN
  Administered 2013-11-20 – 2013-11-21 (×5): 1 via ORAL
  Filled 2013-11-19 (×5): qty 1

## 2013-11-19 MED ORDER — ONDANSETRON HCL 4 MG/2ML IJ SOLN
INTRAMUSCULAR | Status: AC
  Filled 2013-11-19: qty 2

## 2013-11-19 MED ORDER — KETOROLAC TROMETHAMINE 30 MG/ML IJ SOLN: 30.0000 mg | Freq: Four times a day (QID) | INTRAMUSCULAR | Status: AC | PRN

## 2013-11-19 MED ORDER — KETOROLAC TROMETHAMINE 30 MG/ML IJ SOLN
30.0000 mg | Freq: Four times a day (QID) | INTRAMUSCULAR | Status: AC | PRN
  Administered 2013-11-19: 30 mg via INTRAMUSCULAR

## 2013-11-19 MED ORDER — BUPIVACAINE HCL (PF) 0.25 % IJ SOLN
INTRAMUSCULAR | Status: DC | PRN
  Administered 2013-11-19: 10 mL

## 2013-11-19 MED ORDER — OXYTOCIN 10 UNIT/ML IJ SOLN
INTRAMUSCULAR | Status: AC
  Filled 2013-11-19: qty 4

## 2013-11-19 MED ORDER — NALOXONE HCL 0.4 MG/ML IJ SOLN: 0.4000 mg | INTRAMUSCULAR | Status: DC | PRN

## 2013-11-19 MED ORDER — LACTATED RINGERS IV SOLN
INTRAVENOUS | Status: DC
  Administered 2013-11-19: 20:00:00 via INTRAVENOUS

## 2013-11-19 MED ORDER — OXYTOCIN 40 UNITS IN LACTATED RINGERS INFUSION - SIMPLE MED: 62.5000 mL/h | INTRAVENOUS | Status: AC

## 2013-11-19 MED ORDER — SIMETHICONE 80 MG PO CHEW
80.0000 mg | CHEWABLE_TABLET | ORAL | Status: DC | PRN
  Administered 2013-11-19: 80 mg via ORAL

## 2013-11-19 MED ORDER — CARBOPROST TROMETHAMINE 250 MCG/ML IM SOLN
INTRAMUSCULAR | Status: DC | PRN
  Administered 2013-11-19: 250 ug via INTRAMUSCULAR

## 2013-11-19 MED ORDER — ONDANSETRON HCL 4 MG/2ML IJ SOLN: 4.0000 mg | Freq: Three times a day (TID) | INTRAMUSCULAR | Status: DC | PRN

## 2013-11-19 MED ORDER — CEFAZOLIN SODIUM-DEXTROSE 2-3 GM-% IV SOLR
2.0000 g | INTRAVENOUS | Status: AC
  Administered 2013-11-19: 2 g via INTRAVENOUS

## 2013-11-19 MED ORDER — SODIUM CHLORIDE 0.9 % IJ SOLN
INTRAMUSCULAR | Status: AC
  Filled 2013-11-19: qty 20

## 2013-11-19 MED ORDER — MEPERIDINE HCL 25 MG/ML IJ SOLN
INTRAMUSCULAR | Status: AC
  Filled 2013-11-19: qty 1

## 2013-11-19 MED ORDER — LACTATED RINGERS IV SOLN
Freq: Once | INTRAVENOUS | Status: AC
  Administered 2013-11-19: 11:00:00 via INTRAVENOUS

## 2013-11-19 MED ORDER — SIMETHICONE 80 MG PO CHEW
80.0000 mg | CHEWABLE_TABLET | ORAL | Status: DC
  Administered 2013-11-21: 80 mg via ORAL
  Filled 2013-11-19 (×2): qty 1

## 2013-11-19 MED ORDER — POTASSIUM CHLORIDE CRYS ER 10 MEQ PO TBCR
10.0000 meq | EXTENDED_RELEASE_TABLET | Freq: Every day | ORAL | Status: DC
  Administered 2013-11-19 – 2013-11-20 (×2): 10 meq via ORAL
  Filled 2013-11-19 (×4): qty 1

## 2013-11-19 MED ORDER — METHYLERGONOVINE MALEATE 0.2 MG PO TABS
0.2000 mg | ORAL_TABLET | ORAL | Status: DC | PRN
Start: 2013-11-19 — End: 2013-11-21

## 2013-11-19 MED ORDER — DIPHENHYDRAMINE HCL 50 MG/ML IJ SOLN: 25.0000 mg | INTRAMUSCULAR | Status: DC | PRN

## 2013-11-19 MED ORDER — LACTATED RINGERS IV SOLN
INTRAVENOUS | Status: DC
  Administered 2013-11-19 (×3): via INTRAVENOUS

## 2013-11-19 MED ORDER — ONDANSETRON HCL 4 MG/2ML IJ SOLN
INTRAMUSCULAR | Status: DC | PRN
  Administered 2013-11-19: 4 mg via INTRAVENOUS

## 2013-11-19 MED ORDER — NALOXONE HCL 1 MG/ML IJ SOLN
1.0000 ug/kg/h | INTRAVENOUS | Status: DC | PRN
  Filled 2013-11-19: qty 2

## 2013-11-19 MED ORDER — WITCH HAZEL-GLYCERIN EX PADS: 1.0000 "application " | MEDICATED_PAD | CUTANEOUS | Status: DC | PRN

## 2013-11-19 MED ORDER — DIPHENHYDRAMINE HCL 25 MG PO CAPS: 25.0000 mg | ORAL_CAPSULE | Freq: Four times a day (QID) | ORAL | Status: DC | PRN

## 2013-11-19 MED ORDER — FENTANYL CITRATE 0.05 MG/ML IJ SOLN
INTRAMUSCULAR | Status: AC
  Filled 2013-11-19: qty 2

## 2013-11-19 MED ORDER — IBUPROFEN 600 MG PO TABS: 600.0000 mg | ORAL_TABLET | Freq: Four times a day (QID) | ORAL | Status: DC

## 2013-11-19 MED ORDER — FENTANYL CITRATE 0.05 MG/ML IJ SOLN
INTRAMUSCULAR | Status: AC
  Administered 2013-11-19: 50 ug via INTRAVENOUS
  Filled 2013-11-19: qty 2

## 2013-11-19 MED ORDER — LANOLIN HYDROUS EX OINT: 1.0000 "application " | TOPICAL_OINTMENT | CUTANEOUS | Status: DC | PRN

## 2013-11-19 MED ORDER — BUPIVACAINE IN DEXTROSE 0.75-8.25 % IT SOLN
INTRATHECAL | Status: DC | PRN
  Administered 2013-11-19: 1.2 mL via INTRATHECAL

## 2013-11-19 MED ORDER — METOCLOPRAMIDE HCL 5 MG/ML IJ SOLN: 10.0000 mg | Freq: Three times a day (TID) | INTRAMUSCULAR | Status: DC | PRN

## 2013-11-19 MED ORDER — MORPHINE SULFATE 0.5 MG/ML IJ SOLN
INTRAMUSCULAR | Status: AC
  Filled 2013-11-19: qty 10

## 2013-11-19 MED ORDER — SIMETHICONE 80 MG PO CHEW
80.0000 mg | CHEWABLE_TABLET | Freq: Three times a day (TID) | ORAL | Status: DC
  Administered 2013-11-19 – 2013-11-20 (×3): 80 mg via ORAL
  Filled 2013-11-19 (×4): qty 1

## 2013-11-19 MED ORDER — MIDAZOLAM HCL 2 MG/2ML IJ SOLN: 0.5000 mg | Freq: Once | INTRAMUSCULAR | Status: DC | PRN

## 2013-11-19 MED ORDER — DIPHENHYDRAMINE HCL 25 MG PO CAPS
25.0000 mg | ORAL_CAPSULE | ORAL | Status: DC | PRN
  Filled 2013-11-19: qty 1

## 2013-11-19 MED ORDER — BUPIVACAINE HCL (PF) 0.25 % IJ SOLN
INTRAMUSCULAR | Status: AC
  Filled 2013-11-19: qty 10

## 2013-11-19 MED ORDER — MENTHOL 3 MG MT LOZG: 1.0000 | LOZENGE | OROMUCOSAL | Status: DC | PRN

## 2013-11-19 MED ORDER — MEPERIDINE HCL 25 MG/ML IJ SOLN
6.2500 mg | INTRAMUSCULAR | Status: DC | PRN
  Administered 2013-11-19: 6.25 mg via INTRAVENOUS

## 2013-11-19 SURGICAL SUPPLY — 30 items
CLAMP CORD UMBIL (MISCELLANEOUS) IMPLANT
CLOTH BEACON ORANGE TIMEOUT ST (SAFETY) ×2 IMPLANT
CONTAINER PREFILL 10% NBF 15ML (MISCELLANEOUS) IMPLANT
DRAPE LG THREE QUARTER DISP (DRAPES) IMPLANT
DRSG OPSITE POSTOP 4X10 (GAUZE/BANDAGES/DRESSINGS) ×2 IMPLANT
DURAPREP 26ML APPLICATOR (WOUND CARE) ×2 IMPLANT
ELECT REM PT RETURN 9FT ADLT (ELECTROSURGICAL) ×2
ELECTRODE REM PT RTRN 9FT ADLT (ELECTROSURGICAL) ×1 IMPLANT
EXTRACTOR VACUUM M CUP 4 TUBE (SUCTIONS) IMPLANT
GLOVE BIO SURGEON STRL SZ7.5 (GLOVE) ×2 IMPLANT
GOWN PREVENTION PLUS XLARGE (GOWN DISPOSABLE) ×4 IMPLANT
GOWN STRL NON-REIN LRG LVL3 (GOWN DISPOSABLE) ×2 IMPLANT
GOWN STRL REIN XL XLG (GOWN DISPOSABLE) ×2 IMPLANT
KIT ABG SYR 3ML LUER SLIP (SYRINGE) IMPLANT
NEEDLE HYPO 25X1 1.5 SAFETY (NEEDLE) ×2 IMPLANT
NEEDLE HYPO 25X5/8 SAFETYGLIDE (NEEDLE) IMPLANT
NS IRRIG 1000ML POUR BTL (IV SOLUTION) ×2 IMPLANT
PACK C SECTION WH (CUSTOM PROCEDURE TRAY) ×2 IMPLANT
PAD OB MATERNITY 4.3X12.25 (PERSONAL CARE ITEMS) ×2 IMPLANT
STAPLER VISISTAT 35W (STAPLE) IMPLANT
SUT MNCRL 0 VIOLET CTX 36 (SUTURE) ×2 IMPLANT
SUT MON AB 2-0 CT1 27 (SUTURE) ×2 IMPLANT
SUT MON AB-0 CT1 36 (SUTURE) ×4 IMPLANT
SUT MONOCRYL 0 CTX 36 (SUTURE) ×2
SUT PLAIN 0 NONE (SUTURE) IMPLANT
SUT PLAIN 2 0 XLH (SUTURE) IMPLANT
SYR CONTROL 10ML LL (SYRINGE) ×2 IMPLANT
TOWEL OR 17X24 6PK STRL BLUE (TOWEL DISPOSABLE) ×2 IMPLANT
TRAY FOLEY CATH 14FR (SET/KITS/TRAYS/PACK) ×2 IMPLANT
WATER STERILE IRR 1000ML POUR (IV SOLUTION) IMPLANT

## 2013-11-19 NOTE — Anesthesia Preprocedure Evaluation (Signed)
Anesthesia Evaluation  Patient identified by MRN, date of birth, ID band Patient awake    Reviewed: Allergy & Precautions, H&P , NPO status , Patient's Chart, lab work & pertinent test results  Airway Mallampati: II      Dental   Pulmonary shortness of breath,  breath sounds clear to auscultation        Cardiovascular Exercise Tolerance: Good + dysrhythmias Rhythm:regular Rate:Normal     Neuro/Psych    GI/Hepatic   Endo/Other  diabetes  Renal/GU      Musculoskeletal   Abdominal   Peds  Hematology   Anesthesia Other Findings Heart palpitations     Gestational diabetes mellitus, antepartum 2014 diet controlled    Gestational diabetes     Dysrhythmia        Shortness of breath   with palpitations    Reproductive/Obstetrics (+) Pregnancy                           Anesthesia Physical Anesthesia Plan  ASA: III  Anesthesia Plan: Spinal   Post-op Pain Management:    Induction:   Airway Management Planned:   Additional Equipment:   Intra-op Plan:   Post-operative Plan:   Informed Consent: I have reviewed the patients History and Physical, chart, labs and discussed the procedure including the risks, benefits and alternatives for the proposed anesthesia with the patient or authorized representative who has indicated his/her understanding and acceptance.     Plan Discussed with: Anesthesiologist, CRNA and Surgeon  Anesthesia Plan Comments:         Anesthesia Quick Evaluation

## 2013-11-19 NOTE — Transfer of Care (Signed)
Immediate Anesthesia Transfer of Care Note  Patient: [name]  Procedure(s) Performed: Procedure(s) with comments: REPEAT CESAREAN SECTION WITH BILATERAL TUBAL LIGATION; TWINS (Bilateral) - EDD: 12/09/13  Patient Location: PACU  Anesthesia Type:Spinal  Level of Consciousness: awake, alert  and oriented  Airway & Oxygen Therapy: Patient Spontanous Breathing  Post-op Assessment: Report given to PACU RN and Post -op Vital signs reviewed and stable  Post vital signs: Reviewed and stable  Complications: No apparent anesthesia complications

## 2013-11-19 NOTE — Lactation Note (Signed)
This note was copied from the chart of [name]. Lactation Consultation Note  Patient Name: [name] [id number] Date: 11/19/2013 Reason for consult: Initial assessment;Other (Comment) (charting for exclusion)   Maternal Data Formula Feeding for Exclusion: Yes Reason for exclusion: Mother's choice to formula feed on admision  Feeding Feeding Type: Formula  LATCH Score/Interventions                      Lactation Tools Discussed/Used     Consult Status Consult Status: Complete    Lynda Rainwater 11/19/2013, 4:33 PM

## 2013-11-19 NOTE — Op Note (Signed)
Cesarean Section Procedure Note  Indications: previous uterine incision Kerr x one and DIDI twins with malpresentation  Pre-operative Diagnosis: 37 week 0 day pregnancy.  Post-operative Diagnosis: same  Surgeon: Lenoard Aden   Assistants: Ernestina Penna  Anesthesia: Local anesthesia 0.25.% bupivacaine and Spinal anesthesia  ASA Class: 2  Procedure Details  The patient was seen in the Holding Room. The risks, benefits, complications, treatment options, and expected outcomes were discussed with the patient.  The patient concurred with the proposed plan, giving informed consent. The risks of anesthesia, infection, bleeding and possible injury to other organs discussed. Injury to bowel, bladder, or ureter with possible need for repair discussed. Possible need for transfusion with secondary risks of hepatitis or HIV acquisition discussed. Post operative complications to include but not limited to DVT, PE and Pneumonia noted. The site of surgery properly noted/marked. The patient was taken to Operating Room # 9, identified as Annita Ratliff and the procedure verified as C-Section Delivery. A Time Out was held and the above information confirmed.  After induction of anesthesia, the patient was draped and prepped in the usual sterile manner. A Pfannenstiel incision was made and carried down through the subcutaneous tissue to the fascia. Fascial incision was made and extended transversely using Mayo scissors. The fascia was separated from the underlying rectus tissue superiorly and inferiorly. The peritoneum was identified and entered. Peritoneal incision was extended longitudinally. The utero-vesical peritoneal reflection was incised transversely and the bladder flap was bluntly freed from the lower uterine segment. A low transverse uterine incision(Kerr hysterotomy) was made. Delivered from transverse  presentation was a  Female twin A  with Apgar scores of 9 at one minute and 9 at five minutes. Bulb  suctioning gently performed. Neonatal team in attendance.After the umbilical cord was clamped and cut cord blood was obtained for evaluation. Delivered from transverse  presentation was a  Female twin B  with Apgar scores of 9 at one minute and 9 at five minutes. Bulb suctioning gently performed. Neonatal team in attendance.After the umbilical cord was clamped and cut cord blood was obtained for evaluation. The placenta was removed intact and appeared normal. The uterus was curetted with a dry lap pack. Good hemostasis was noted.The uterine outline, tubes and ovaries appeared normal. The uterine incision was closed with running locked sutures of 0 Monocryl x 2 layers. Hemostasis was observed. Modified Pomeroy method to interrupt both tubes. Lavage was carried out until clear.The parietal peritoneum was closed with a running 2-0 Monocryl suture. The fascia was then reapproximated with running sutures of 0 Monocryl. The skin was reapproximated with 4-0 vicryl.  Instrument, sponge, and needle counts were correct prior the abdominal closure and at the conclusion of the case.   Findings: above  Estimated Blood Loss:  300 mL         Drains: foley                 Specimens: placentas                 Complications:  None; patient tolerated the procedure well.         Disposition: PACU - hemodynamically stable.         Condition: stable  Attending Attestation: I performed the procedure.

## 2013-11-19 NOTE — Anesthesia Procedure Notes (Signed)
Spinal  Patient location during procedure: OR Start time: 11/19/2013 11:58 AM Staffing Performed by: anesthesiologist  Preanesthetic Checklist Completed: patient identified, site marked, surgical consent, pre-op evaluation, timeout performed, IV checked, risks and benefits discussed and monitors and equipment checked Spinal Block Patient position: sitting Prep: site prepped and draped and DuraPrep Patient monitoring: heart rate, cardiac monitor, continuous pulse ox and blood pressure Approach: midline Location: L3-4 Injection technique: single-shot Needle Needle type: Pencan  Needle gauge: 24 G Needle length: 9 cm Assessment Sensory level: T4 Additional Notes Clear free flow CSF on first attempt.  No paresthesia.  Patient tolerated procedure well with no apparent complications.  Jasmine December, MD

## 2013-11-19 NOTE — Progress Notes (Signed)
Patient ID: [name], female   DOB: 08-28-1986, 28 y.o.   MRN: [id number] Patient seen and examined. Consent witnessed and signed. No changes noted. Update completed.

## 2013-11-19 NOTE — Anesthesia Postprocedure Evaluation (Signed)
  Anesthesia Post-op Note  Anesthesia Post Note  Patient: [name]  Procedure(s) Performed: Procedure(s) (LRB): REPEAT CESAREAN SECTION WITH BILATERAL TUBAL LIGATION; TWINS (Bilateral)  Anesthesia type: Spinal  Patient location: PACU  Post pain: Pain level controlled  Post assessment: Post-op Vital signs reviewed  Last Vitals:  Filed Vitals:   11/19/13 1415  BP:   Pulse:   Temp: 37.2 C  Resp:     Post vital signs: Reviewed  Level of consciousness: awake  Complications: No apparent anesthesia complications

## 2013-11-19 NOTE — Consult Note (Signed)
Neonatology Note:   Attendance at C-section:    I was asked by Dr. Billy Coast to attend this repeat C/S at 37 weeks due to twin gestation. The mother is a G2P1 A pos, GBS not on chart with diet-controlled GDM, polyhydramnios, cholestasis of pregnancy, and twin gestation. ROM at delivery, fluid clear.   Twin A, a female, was transverse, but delivered vertex and was vigorous with good spontaneous cry and tone. Needed only minimal bulb suctioning. Ap 9/10. Lungs clear to ausc in DR. To CN to care of Pediatrician.  Twin B, a female, was delivered vertex and was vigorous with good spontaneous cry and tone. Needed only minimal bulb suctioning. Ap 9/9. Lungs clear to ausc in DR. To CN to care of Pediatrician.   Doretha Sou, MD

## 2013-11-20 ENCOUNTER — Encounter (HOSPITAL_COMMUNITY): Payer: Self-pay | Admitting: Obstetrics and Gynecology

## 2013-11-20 LAB — COMPREHENSIVE METABOLIC PANEL
AST: 25 U/L (ref 0–37)
Albumin: 2.3 g/dL — ABNORMAL LOW (ref 3.5–5.2)
BUN: 8 mg/dL (ref 6–23)
Calcium: 9.1 mg/dL (ref 8.4–10.5)
Creatinine, Ser: 0.51 mg/dL (ref 0.50–1.10)
GFR calc non Af Amer: >90 mL/min (ref >90)
Total Bilirubin: 0.2 mg/dL — ABNORMAL LOW (ref 0.3–1.2)
Total Protein: 4.6 g/dL — ABNORMAL LOW (ref 6.0–8.3)

## 2013-11-20 LAB — CBC
HCT: 32.2 % — ABNORMAL LOW (ref 36.0–46.0)
Hemoglobin: 11 g/dL — ABNORMAL LOW (ref 12.0–15.0)
MCH: 30.1 pg (ref 26.0–34.0)
MCHC: 34.2 g/dL (ref 30.0–36.0)
MCV: 88.2 fL (ref 78.0–100.0)
Platelets: 208 10*3/uL (ref 150–400)
RBC: 3.65 MIL/uL — ABNORMAL LOW (ref 3.87–5.11)
RDW: 14.1 % (ref 11.5–15.5)

## 2013-11-20 NOTE — Anesthesia Postprocedure Evaluation (Signed)
  Anesthesia Post-op Note  Patient: [name]  Procedure(s) Performed: Procedure(s) with comments: REPEAT CESAREAN SECTION WITH BILATERAL TUBAL LIGATION; TWINS (Bilateral) - EDD: 12/09/13  Patient Location: Mother/Baby  Anesthesia Type:Spinal  Level of Consciousness: awake  Airway and Oxygen Therapy: Patient Spontanous Breathing  Post-op Pain: mild  Post-op Assessment: Patient's Cardiovascular Status Stable and Respiratory Function Stable  Post-op Vital Signs: stable  Complications: No apparent anesthesia complications

## 2013-11-20 NOTE — Progress Notes (Addendum)
Patient ID: [name], female   DOB: 03/23/1986, 27 y.o.   MRN: [id number] POD # 1  Subjective: Pt reports feeling tired, but well/ Pain controlled with ibuprofen and intraop meds Tolerating po/ Foley d/c'ed and pt has voided x 2/ No n/v/Flatus neg Activity: up with assistance Bleeding is light Newborn info:  Information for the patient's newborn:  Ellery, Tash [[id number]]  female Information for the patient's newborn:  Willetta, York [[id number]]  female Feeding: breast   Objective: VS: Blood pressure 112/68, pulse 88, temperature 98.3 F (36.8 C), temperature source Oral, resp. rate 18.    Intake/Output Summary (Last 24 hours) at 11/20/13 0947 Last data filed at 11/20/13 0445  Gross per 24 hour  Intake   3680 ml  Output   3650 ml  Net     30 ml      Recent Labs  11/20/13 0620  WBC 20.0*  HGB 11.0*  HCT 32.2*  PLT 208    Blood type: A POS Rubella: Immune    Physical Exam:  General: alert, cooperative and no distress CV: Regular rate and rhythm Resp: clear Abdomen: soft, nontender, hypoactive BS x 4 quads Incision: covered with large occlusive dressing; closed with sutures Uterine Fundus: firm, below umbilicus, nontender Lochia: minimal Ext: edema trace and Homans sign is negative, no sign of DVT    A/P: POD # 1/ G2P2003 S/P Rpt C/Section w/ BTL d/t Twins/Polyhydramnios/GDM/cholestasis of preg Doing well Continue routine post op orders   Signed: Demetrius Revel, MSN, [facility] 11/20/2013, 9:47 AM

## 2013-11-21 MED ORDER — OXYCODONE-ACETAMINOPHEN 5-325 MG PO TABS: 1.0000 | ORAL_TABLET | ORAL | Status: DC | PRN

## 2013-11-21 MED ORDER — IBUPROFEN 600 MG PO TABS: 600.0000 mg | ORAL_TABLET | Freq: Four times a day (QID) | ORAL | Status: DC | PRN

## 2013-11-21 NOTE — Discharge Summary (Signed)
DISCHARGE SUMMARY:  Patient ID: [name] MRN: [id number] DOB/AGE: 05/17/1986 27 y.o.  Admit date: 11/19/2013 Admission Diagnoses: 37.2 wk didi twin pregnancy   Discharge date: [id number]    Discharge Diagnoses: S/p repeat cesarean section, BTL  Prenatal history: G2P2003   EDC : 12/08/2013, Alternate EDD Entry  Prenatal care at [facility]  Primary provider : Dr. Billy Coast Prenatal course complicated by didi twins, cholestasis, polyhydramnios, heart palpitations, GDM  Prenatal Labs: ABO, Rh: A (05/19 0000)  Antibody: NEG (11/24 0945) Rubella: Immune (05/09 0000)  RPR: NON REACTIVE (11/24 0945)  HBsAg: Negative (05/09 0000)  HIV: Non-reactive (05/09 0000)  GBS:   negative GTT: abnormal  Medical / Surgical History :  Past medical history:  Past Medical History  Diagnosis Date  . Heart palpitations   . Gestational diabetes mellitus, antepartum 2014    diet controlled  . Gestational diabetes   . Dysrhythmia   . Shortness of breath     with palpitations  . Postpartum care following cesarean delivery (11/26) 11/19/2013    Past surgical history:  Past Surgical History  Procedure Laterality Date  . Cholecystectomy    . Cesarean section    . Cesarean section with bilateral tubal ligation Bilateral 11/19/2013    Procedure: REPEAT CESAREAN SECTION WITH BILATERAL TUBAL LIGATION; TWINS;  Surgeon: Lenoard Aden, MD;  Location: [facility];  Service: Obstetrics;  Laterality: Bilateral;  EDD: 12/09/13    Family History:  Family History  Problem Relation Age of Onset  . Cancer Paternal Aunt     breast ca  . Heart disease Maternal Grandfather     Heart attack    Social History:  reports that she has never smoked. She does not have any smokeless tobacco history on file. She reports that she does not drink alcohol or use illicit drugs.   Allergies: Review of patient's allergies indicates no known allergies.    Current Medications at time of admission:   Prior to Admission medications   Medication Sig Start Date End Date Taking? Authorizing Provider  potassium chloride (K-DUR,KLOR-CON) 10 MEQ tablet Take 10 mEq by mouth daily.   Yes Historical Provider, MD  Prenatal Vit-Fe Fumarate-FA (MULTIVITAMIN-PRENATAL) 27-0.8 MG TABS tablet Take 1 tablet by mouth daily at 12 noon.   Yes Historical Provider, MD                   Intrapartum Course: scheduled repeat CS   Procedures: Cesarean section delivery of 2 female newborns by Dr Billy Coast on 11/19/2013 See operative report for further details APGAR (1 MIN):    Satcha, Storlie [[id number]]  [address] [place] [[id number]]  9   APGAR (5 MINS):    Rheana, Casebolt [[id number]]  [address] [[id number]]  9     Postoperative / postpartum course: uncomplicated Physical Exam:   VSS: Temp:  [98.1 F (36.7 C)-98.6 F (37 C)] 98.1 F (36.7 C) (11/28 0552) Pulse Rate:  [82-92] 83 (11/28 0552) Resp:  [18] 18 (11/28 0552) BP: (95-104)/(58-66) 96/62 mmHg (11/28 0552) SpO2:  [97 %-99 %] 97 % (11/28 0552) Weight:  [72.576 kg (160 lb)] 72.576 kg (160 lb) (11/27 2300)  LABS:  Recent Labs  11/20/13 0620  WBC 20.0*  HGB 11.0*  PLT 208    General: alert and oriented x3 Heart: RRR Lungs: clear  Abdomen: soft and non-tender / non-distended / active BS  Extremities: no edema / negative Homans  Dressing: honeycomb  dressing  Incision:  approximated with subcuticular / no erythema / no ecchymosis / scant brown drainage  Discharge Instructions:  Discharged Condition: good Activity: pelvic rest and postoperative restrictions x 2 weeks Diet: routine Medications: PNV, Ibuprofen and Percocet   Medication List    STOP taking these medications       acetaminophen 325 MG tablet  Commonly known as:  TYLENOL     NIFEdipine 10 MG capsule  Commonly known as:  PROCARDIA      TAKE these medications       ibuprofen 600 MG tablet  Commonly known as:  ADVIL,MOTRIN   Take 1 tablet (600 mg total) by mouth every 6 (six) hours as needed for mild pain.     multivitamin-prenatal 27-0.8 MG Tabs tablet  Take 1 tablet by mouth daily at 12 noon.     oxyCODONE-acetaminophen 5-325 MG per tablet  Commonly known as:  PERCOCET/ROXICET  Take 1-2 tablets by mouth every 4 (four) hours as needed for severe pain (moderate - severe pain).     potassium chloride 10 MEQ tablet  Commonly known as:  K-DUR,KLOR-CON  Take 10 mEq by mouth daily.       Wound Care: Keep dressing clean and dry, remove in 2-3 days Clean incision with warm water and dry thoroughly Call the office for redness, swelling, or drainage from incision  Postpartum Instructions: [facility] discharge booklet - instructions reviewed Discharge to: Home  Follow up : [facility] in 6 weeks for routine postpartum visit                      Signed: Donette Larry, N MSN, CNM 11/21/2013, 10:28 AM

## 2013-11-21 NOTE — Progress Notes (Addendum)
POD # 2  Subjective: Pt reports feeling good, wants early discharge/ Pain controlled with Motrin and Percocet Tolerating po/Voiding without problems/ No n/v/ Flatus present Activity: ad lib Bleeding is light Newborn info:  Information for the patient's newborn:  Makaylie, Dedeaux [[id number]]  female Information for the patient's newborn:  Abbagale, Goguen [[id number]]  female Feeding: bottle   Objective: VS: VS:  Filed Vitals:   11/20/13 1151 11/20/13 1730 11/20/13 2300 11/21/13 0552  BP: 104/66 95/58  96/62  Pulse: 82 92  83  Temp: 98.3 F (36.8 C) 98.6 F (37 C)  98.1 F (36.7 C)  TempSrc: Oral Oral  Oral  Resp: 18 18  18   Height:   5' (1.524 m)   Weight:   72.576 kg (160 lb)   SpO2: 99%   97%    I&O: Intake/Output     11/27 0701 - 11/28 0700 11/28 0701 - 11/29 0700   P.O.     I.V. (mL/kg)     Total Intake(mL/kg)     Urine (mL/kg/hr) 300 (0.2)    Blood     Total Output 300     Net -300            LABS:  Recent Labs  11/20/13 0620  WBC 20.0*  HGB 11.0*  PLT 208                           Physical Exam:  General: alert and cooperative CV: Regular rate and rhythm Resp: CTA bilaterally Abdomen: soft, nontender, normal bowel sounds Incision: healing well, no erythema, no hernia, no seroma, no swelling, well approximated, scant old brown drainage Uterine Fundus: firm, below umbilicus, nontender Lochia: none Ext: extremities normal, atraumatic, no cyanosis or edema and Homans sign is negative, no sign of DVT    Assessment: POD # 2/ G2P2003/ S/P C/Section d/t repeat, twins, cholestasis Doing well and stable for discharge home  Plan: Discharge home RX's: Ibuprofen 600mg  po Q 6 hrs prn pain #30 Refill x 1 Percocet 5/325 1 - 2 tabs po every 6 hrs prn pain  #30 Refill x 0 [facility] booklet given    Signed: Lawernce Pitts, MSN, CNM 11/21/2013, 10:23 AM

## 2014-01-05 ENCOUNTER — Ambulatory Visit (INDEPENDENT_AMBULATORY_CARE_PROVIDER_SITE_OTHER): Payer: Managed Care, Other (non HMO) | Admitting: General Surgery

## 2014-01-05 ENCOUNTER — Encounter (INDEPENDENT_AMBULATORY_CARE_PROVIDER_SITE_OTHER): Payer: Self-pay | Admitting: General Surgery

## 2014-01-05 VITALS — BP 110/72 | HR 70 | Resp 16 | Ht 60.0 in | Wt 135.0 lb

## 2014-01-05 DIAGNOSIS — K429 Umbilical hernia without obstruction or gangrene: Secondary | ICD-10-CM

## 2014-01-05 NOTE — Progress Notes (Signed)
Patient ID: [name], female   DOB: [birth year], 28 y.o.   MRN: [id number]  Chief Complaint  Patient presents with  . Other    Eval umb hernia    HPI [name] is a 28 y.o. female.   HPI 28 year old Caucasian female referred by Dr. Ronita Hipps for evaluation of umbilical hernia. The patient states that she had a C-section about 6 weeks ago after having twin pregnancy. She states her belly became quite large during her pregnancy and she has a lump around her umbilicus. She states that the skin is darker around her umbilicus. At times there is a lump there. When she lays down the lump goes away. It does not cause any pain or discomfort. She states that her bellybutton has been protruding more especially during her most recent pregnancy. She's had a prior laparoscopic cholecystectomy. She denies any nausea, vomiting, diarrhea or constipation. Past Medical History  Diagnosis Date  . Heart palpitations   . Gestational diabetes mellitus, antepartum 2014    diet controlled  . Gestational diabetes   . Dysrhythmia   . Shortness of breath     with palpitations  . Postpartum care following cesarean delivery (11/26) 11/19/2013    Past Surgical History  Procedure Laterality Date  . Cholecystectomy    . Cesarean section    . Cesarean section with bilateral tubal ligation Bilateral 11/19/2013    Procedure: REPEAT CESAREAN SECTION WITH BILATERAL TUBAL LIGATION; TWINS;  Surgeon: Lovenia Kim, MD;  Location: [facility];  Service: Obstetrics;  Laterality: Bilateral;  EDD: 12/09/13    Family History  Problem Relation Age of Onset  . Cancer Paternal Aunt     breast ca  . Heart disease Maternal Grandfather     Heart attack    Social History History  Substance Use Topics  . Smoking status: Never Smoker   . Smokeless tobacco: Not on file  . Alcohol Use: No    No Known Allergies  Current Outpatient Prescriptions  Medication Sig Dispense Refill  . ibuprofen (ADVIL,MOTRIN) 600 MG tablet Take 1  tablet (600 mg total) by mouth every 6 (six) hours as needed for mild pain.  30 tablet  0  . potassium chloride (K-DUR,KLOR-CON) 10 MEQ tablet Take 10 mEq by mouth daily.      . Prenatal Vit-Fe Fumarate-FA (MULTIVITAMIN-PRENATAL) 27-0.8 MG TABS tablet Take 1 tablet by mouth daily at 12 noon.      Marland Kitchen oxyCODONE-acetaminophen (PERCOCET/ROXICET) 5-325 MG per tablet Take 1-2 tablets by mouth every 4 (four) hours as needed for severe pain (moderate - severe pain).  30 tablet  0   No current facility-administered medications for this visit.    Review of Systems Review of Systems  Constitutional: Negative for fever, activity change, appetite change and unexpected weight change.  HENT: Negative for nosebleeds and trouble swallowing.   Eyes: Negative for photophobia and visual disturbance.  Respiratory: Negative for chest tightness and shortness of breath.   Cardiovascular: Positive for palpitations. Negative for chest pain and leg swelling.       Denies CP, SOB, orthopnea, PND, DOE; has cardiac palpitations. Takes potassium as needed. She states that this was worked up during her most recent pregnancy and the cardiologist stated that her heart function was normal.  Gastrointestinal: Negative for nausea, vomiting, abdominal pain, diarrhea and constipation.  Genitourinary: Negative for dysuria and difficulty urinating.  Musculoskeletal: Negative for arthralgias.  Skin: Negative for pallor and rash.  Neurological: Negative for dizziness, seizures, facial asymmetry and numbness.  Denies TIA and amaurosis fugax   Hematological: Negative for adenopathy. Does not bruise/bleed easily.  Psychiatric/Behavioral: Negative for behavioral problems and agitation.    Blood pressure 110/72, pulse 70, resp. rate 16, height 5' (1.524 m), weight 135 lb (61.236 kg).  Physical Exam Physical Exam  Vitals reviewed. Constitutional: She is oriented to person, place, and time. She appears well-developed and  well-nourished. No distress.  HENT:  Head: Normocephalic and atraumatic.  Right Ear: External ear normal.  Left Ear: External ear normal.  Eyes: Conjunctivae are normal. No scleral icterus.  Neck: Normal range of motion. Neck supple. No tracheal deviation present. No thyromegaly present.  Cardiovascular: Normal rate and normal heart sounds.   Pulmonary/Chest: Effort normal and breath sounds normal. No stridor. No respiratory distress. She has no wheezes.  Abdominal: Soft. She exhibits no distension. There is no tenderness. There is no rebound and no guarding.  Mild diastasis. Positive umbilical hernia. Reducible. Nontender. Defect of about 1-1/2-2 cm. Well-healed Pfannenstiel incision  Musculoskeletal: She exhibits no edema and no tenderness.  Lymphadenopathy:    She has no cervical adenopathy.  Neurological: She is alert and oriented to person, place, and time. She exhibits normal muscle tone.  Skin: Skin is warm and dry. No rash noted. She is not diaphoretic. No erythema.  Psychiatric: She has a normal mood and affect. Her behavior is normal. Judgment and thought content normal.    [facility] d/c summary  Assessment    Umbilical hernia     Plan    We discussed the etiology of ventral umbilical hernias. We discussed the signs and symptoms of incarceration and strangulation. The patient was given educational material. I also drew diagrams.  We discussed nonoperative and operative management. With respect to operative management, we discussed  open repair   We discussed the risk and benefits of surgery including but not limited to bleeding, infection, injury to surrounding structures, hernia recurrence, mesh complications, hematoma/seroma formation, blood clot formation, urinary retention, general anesthesia risk, long-term abdominal pain.  We discussed the importance of avoiding heavy lifting and straining for a period of 6 weeks.  The patient has elected to Proceed to  the operating room for open repair of umbilical hernia with mesh. We will plan to wait a total of around 12 weeks from her C-section. Therefore we will tentatively plan surgery around the beginning of March. Our office will contact the patient to schedule surgery  Leighton Ruff. Redmond Pulling, MD, FACS General, Bariatric, & Minimally Invasive Surgery [facility], [place]          [facility] M 01/05/2014, 2:17 PM

## 2014-01-05 NOTE — Patient Instructions (Signed)
Call the office in early to mid Feb to schedule your surgery for March  Please call if you have any questions

## 2014-01-09 ENCOUNTER — Telehealth (INDEPENDENT_AMBULATORY_CARE_PROVIDER_SITE_OTHER): Payer: Self-pay

## 2014-01-09 NOTE — Telephone Encounter (Signed)
Left VM returning pt's phonecall stating available throughout afternoon or next week to talk

## 2014-01-09 NOTE — Telephone Encounter (Signed)
Patient would like to speak to Dr. Redmond Pulling concerning her up coming hernia surgery, she is asking if she can have a" laparoscopic " hernia repair sx instead of "open"  hernia repair sx please Adise

## 2014-01-14 NOTE — Telephone Encounter (Signed)
Call patient to discuss umbilical hernia surgery. The patient had discussed my recommendations with her OB/GYN. She was very concerned about the length of recovery. He suggested that she may have a quicker recovery with a laparoscopic repair. I advised the patient that in my opinion she would actually have a higher incidence or chance of having chronic abdominal wall pain with a laparoscopic repair. We discussed how the mesh was anchored to the abdominal wall with a laparoscopic repair. I explained that on occasion patients can have long-term abdominal wall pain as a result of transfascial suture fixation. I also advised the patient that even if we did a laparoscopic repair my restrictions on when she can resume heavy lifting would not be altered. She would need to wait at least 4 weeks before engaging in strenuous activities. Since her umbilical hernia is relatively small I recommended her proceeding with an open repair as I think this will get her back to full activities as soon as and have a lower incidence of long-term abdominal wall pain. The patient agreed. She is now and she didn't scheduling surgery. She is going to contact her schedulers later today.

## 2014-01-27 ENCOUNTER — Telehealth (INDEPENDENT_AMBULATORY_CARE_PROVIDER_SITE_OTHER): Payer: Self-pay | Admitting: General Surgery

## 2014-01-27 NOTE — Telephone Encounter (Signed)
01/27/14 I spoke with patient over the phone today. I went over her benefits and financial responsibility. She will speak with her husband and call back when she is ready to schedule. Placed in pending skm

## 2014-10-26 ENCOUNTER — Encounter (INDEPENDENT_AMBULATORY_CARE_PROVIDER_SITE_OTHER): Payer: Self-pay | Admitting: General Surgery

## 2015-04-01 DIAGNOSIS — R109 Unspecified abdominal pain: Secondary | ICD-10-CM | POA: Insufficient documentation

## 2015-07-11 DIAGNOSIS — H6500 Acute serous otitis media, unspecified ear: Secondary | ICD-10-CM | POA: Insufficient documentation

## 2015-09-03 DIAGNOSIS — K432 Incisional hernia without obstruction or gangrene: Secondary | ICD-10-CM | POA: Insufficient documentation

## 2015-11-08 DIAGNOSIS — B084 Enteroviral vesicular stomatitis with exanthem: Secondary | ICD-10-CM | POA: Insufficient documentation

## 2016-11-22 DIAGNOSIS — M542 Cervicalgia: Secondary | ICD-10-CM | POA: Insufficient documentation

## 2016-11-22 DIAGNOSIS — M545 Low back pain, unspecified: Secondary | ICD-10-CM | POA: Insufficient documentation

## 2016-11-22 DIAGNOSIS — M546 Pain in thoracic spine: Secondary | ICD-10-CM | POA: Insufficient documentation

## 2017-09-15 ENCOUNTER — Encounter (HOSPITAL_COMMUNITY): Payer: Self-pay | Admitting: Emergency Medicine

## 2017-09-15 ENCOUNTER — Emergency Department (HOSPITAL_COMMUNITY): Payer: BLUE CROSS/BLUE SHIELD

## 2017-09-15 ENCOUNTER — Emergency Department (HOSPITAL_COMMUNITY)
Admission: EM | Admit: 2017-09-15 | Discharge: 2017-09-15 | Disposition: A | Discharge status: Home / Self Care | Payer: BLUE CROSS/BLUE SHIELD | Attending: Emergency Medicine | Admitting: Emergency Medicine

## 2017-09-15 DIAGNOSIS — Y999 Unspecified external cause status: Secondary | ICD-10-CM | POA: Insufficient documentation

## 2017-09-15 DIAGNOSIS — Y92410 Unspecified street and highway as the place of occurrence of the external cause: Secondary | ICD-10-CM | POA: Insufficient documentation

## 2017-09-15 DIAGNOSIS — S161XXA Strain of muscle, fascia and tendon at neck level, initial encounter: Secondary | ICD-10-CM | POA: Diagnosis not present

## 2017-09-15 DIAGNOSIS — S80811A Abrasion, right lower leg, initial encounter: Secondary | ICD-10-CM | POA: Diagnosis not present

## 2017-09-15 DIAGNOSIS — S0990XA Unspecified injury of head, initial encounter: Secondary | ICD-10-CM | POA: Diagnosis not present

## 2017-09-15 DIAGNOSIS — S199XXA Unspecified injury of neck, initial encounter: Secondary | ICD-10-CM | POA: Diagnosis not present

## 2017-09-15 DIAGNOSIS — M542 Cervicalgia: Secondary | ICD-10-CM | POA: Diagnosis not present

## 2017-09-15 DIAGNOSIS — S80812A Abrasion, left lower leg, initial encounter: Secondary | ICD-10-CM | POA: Diagnosis not present

## 2017-09-15 DIAGNOSIS — Y9389 Activity, other specified: Secondary | ICD-10-CM | POA: Diagnosis not present

## 2017-09-15 DIAGNOSIS — T07XXXA Unspecified multiple injuries, initial encounter: Secondary | ICD-10-CM

## 2017-09-15 MED ORDER — HYDROCODONE-ACETAMINOPHEN 5-325 MG PO TABS
1.0000 | ORAL_TABLET | Freq: Once | ORAL | Status: AC
  Administered 2017-09-15: 1 via ORAL
  Filled 2017-09-15: qty 1

## 2017-09-15 MED ORDER — HYDROMORPHONE HCL 1 MG/ML IJ SOLN
0.5000 mg | Freq: Once | INTRAMUSCULAR | Status: AC
  Administered 2017-09-15: 0.5 mg via INTRAMUSCULAR
  Filled 2017-09-15: qty 1

## 2017-09-15 MED ORDER — ONDANSETRON 4 MG PO TBDP
ORAL_TABLET | ORAL | Status: AC
  Filled 2017-09-15: qty 1

## 2017-09-15 MED ORDER — CYCLOBENZAPRINE HCL 10 MG PO TABS: 10.0000 mg | ORAL_TABLET | Freq: Three times a day (TID) | ORAL | 0 refills | Status: DC

## 2017-09-15 MED ORDER — DIAZEPAM 5 MG PO TABS
5.0000 mg | ORAL_TABLET | Freq: Once | ORAL | Status: AC
  Administered 2017-09-15: 5 mg via ORAL
  Filled 2017-09-15: qty 1

## 2017-09-15 MED ORDER — IBUPROFEN 600 MG PO TABS: 600.0000 mg | ORAL_TABLET | Freq: Three times a day (TID) | ORAL | 0 refills | Status: DC

## 2017-09-15 MED ORDER — HYDROCODONE-ACETAMINOPHEN 5-325 MG PO TABS
1.0000 | ORAL_TABLET | Freq: Once | ORAL | Status: AC
  Administered 2017-09-15: 1 via ORAL

## 2017-09-15 MED ORDER — HYDROCODONE-ACETAMINOPHEN 5-325 MG PO TABS: 1.0000 | ORAL_TABLET | ORAL | 0 refills | Status: DC | PRN

## 2017-09-15 MED ORDER — HYDROCODONE-ACETAMINOPHEN 5-325 MG PO TABS
2.0000 | ORAL_TABLET | Freq: Once | ORAL | Status: DC
  Filled 2017-09-15: qty 2

## 2017-09-15 MED ORDER — PROMETHAZINE HCL 12.5 MG PO TABS
12.5000 mg | ORAL_TABLET | Freq: Once | ORAL | Status: AC
  Administered 2017-09-15: 12.5 mg via ORAL
  Filled 2017-09-15: qty 1

## 2017-09-15 MED ORDER — ONDANSETRON 4 MG PO TBDP
4.0000 mg | ORAL_TABLET | Freq: Once | ORAL | Status: AC
  Administered 2017-09-15: 4 mg via ORAL

## 2017-09-15 MED ORDER — IBUPROFEN 400 MG PO TABS
400.0000 mg | ORAL_TABLET | Freq: Once | ORAL | Status: AC | PRN
  Administered 2017-09-15: 400 mg via ORAL
  Filled 2017-09-15: qty 1

## 2017-09-15 NOTE — ED Notes (Signed)
Pt ordered Norco x2. Pt only requesting one. Order modified. Norco replaced in pyxsis
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
## 2017-09-15 NOTE — Discharge Instructions (Signed)
Your vital signs are within normal limits. Your CT scans of head and neck are negative for acute problem. 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 Please have your primary MD recheck this as an outpatient. Use flexeril three times daily. Use ibuprofen three times daily. Use norco for more severe pain. Flexeril and Norco may cause drowsiness, use this medication with caution. See Dr Quintin Alto for follow up next week.
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
## 2017-09-15 NOTE — ED Notes (Signed)
Patient transported to CT 

## 2017-09-15 NOTE — ED Notes (Signed)
Pt requesting additional pain pill that she originally did not take due to fear of it making her nauseated
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
## 2017-09-19 DIAGNOSIS — G441 Vascular headache, not elsewhere classified: Secondary | ICD-10-CM | POA: Diagnosis not present

## 2017-09-19 DIAGNOSIS — S134XXA Sprain of ligaments of cervical spine, initial encounter: Secondary | ICD-10-CM | POA: Diagnosis not present

## 2017-09-19 DIAGNOSIS — M546 Pain in thoracic spine: Secondary | ICD-10-CM | POA: Diagnosis not present

## 2017-09-20 DIAGNOSIS — S134XXA Sprain of ligaments of cervical spine, initial encounter: Secondary | ICD-10-CM | POA: Diagnosis not present

## 2017-09-20 DIAGNOSIS — M546 Pain in thoracic spine: Secondary | ICD-10-CM | POA: Diagnosis not present

## 2017-09-20 DIAGNOSIS — G441 Vascular headache, not elsewhere classified: Secondary | ICD-10-CM | POA: Diagnosis not present

## 2017-09-24 DIAGNOSIS — M546 Pain in thoracic spine: Secondary | ICD-10-CM | POA: Diagnosis not present

## 2017-09-24 DIAGNOSIS — S134XXA Sprain of ligaments of cervical spine, initial encounter: Secondary | ICD-10-CM | POA: Diagnosis not present

## 2017-09-24 DIAGNOSIS — G441 Vascular headache, not elsewhere classified: Secondary | ICD-10-CM | POA: Diagnosis not present

## 2017-10-14 DIAGNOSIS — E876 Hypokalemia: Secondary | ICD-10-CM | POA: Diagnosis not present

## 2017-10-14 DIAGNOSIS — R002 Palpitations: Secondary | ICD-10-CM | POA: Diagnosis not present

## 2017-10-14 DIAGNOSIS — E0590 Thyrotoxicosis, unspecified without thyrotoxic crisis or storm: Secondary | ICD-10-CM | POA: Diagnosis not present

## 2017-10-16 DIAGNOSIS — E876 Hypokalemia: Secondary | ICD-10-CM | POA: Insufficient documentation

## 2017-10-16 DIAGNOSIS — Z6824 Body mass index (BMI) 24.0-24.9, adult: Secondary | ICD-10-CM | POA: Diagnosis not present

## 2017-10-16 DIAGNOSIS — R002 Palpitations: Secondary | ICD-10-CM | POA: Diagnosis not present

## 2017-10-23 DIAGNOSIS — E041 Nontoxic single thyroid nodule: Secondary | ICD-10-CM | POA: Diagnosis not present

## 2017-10-30 ENCOUNTER — Encounter: Payer: Self-pay | Admitting: *Deleted

## 2017-10-31 ENCOUNTER — Ambulatory Visit: Payer: BLUE CROSS/BLUE SHIELD | Admitting: Cardiovascular Disease

## 2017-11-21 DIAGNOSIS — M6281 Muscle weakness (generalized): Secondary | ICD-10-CM | POA: Diagnosis not present

## 2017-11-21 DIAGNOSIS — M542 Cervicalgia: Secondary | ICD-10-CM | POA: Diagnosis not present

## 2017-11-21 DIAGNOSIS — M546 Pain in thoracic spine: Secondary | ICD-10-CM | POA: Diagnosis not present

## 2017-12-05 DIAGNOSIS — M50221 Other cervical disc displacement at C4-C5 level: Secondary | ICD-10-CM | POA: Diagnosis not present

## 2017-12-05 DIAGNOSIS — M542 Cervicalgia: Secondary | ICD-10-CM | POA: Diagnosis not present

## 2017-12-05 DIAGNOSIS — M5023 Other cervical disc displacement, cervicothoracic region: Secondary | ICD-10-CM | POA: Diagnosis not present

## 2017-12-31 DIAGNOSIS — S161XXA Strain of muscle, fascia and tendon at neck level, initial encounter: Secondary | ICD-10-CM | POA: Diagnosis not present

## 2017-12-31 DIAGNOSIS — M47812 Spondylosis without myelopathy or radiculopathy, cervical region: Secondary | ICD-10-CM | POA: Diagnosis not present

## 2017-12-31 DIAGNOSIS — S39012A Strain of muscle, fascia and tendon of lower back, initial encounter: Secondary | ICD-10-CM | POA: Diagnosis not present

## 2017-12-31 DIAGNOSIS — Z6823 Body mass index (BMI) 23.0-23.9, adult: Secondary | ICD-10-CM | POA: Diagnosis not present

## 2018-01-01 ENCOUNTER — Other Ambulatory Visit: Payer: Self-pay | Admitting: Neurosurgery

## 2018-01-01 DIAGNOSIS — M47812 Spondylosis without myelopathy or radiculopathy, cervical region: Secondary | ICD-10-CM

## 2018-01-18 ENCOUNTER — Ambulatory Visit
Admission: RE | Admit: 2018-01-18 | Discharge: 2018-01-18 | Disposition: A | Discharge status: Home / Self Care | Payer: BLUE CROSS/BLUE SHIELD | Source: Ambulatory Visit | Attending: Neurosurgery | Admitting: Neurosurgery

## 2018-01-18 DIAGNOSIS — M47812 Spondylosis without myelopathy or radiculopathy, cervical region: Secondary | ICD-10-CM | POA: Diagnosis not present

## 2018-01-18 DIAGNOSIS — Z9851 Tubal ligation status: Secondary | ICD-10-CM | POA: Insufficient documentation

## 2018-01-18 DIAGNOSIS — Z87898 Personal history of other specified conditions: Secondary | ICD-10-CM | POA: Insufficient documentation

## 2018-01-18 DIAGNOSIS — O24419 Gestational diabetes mellitus in pregnancy, unspecified control: Secondary | ICD-10-CM | POA: Insufficient documentation

## 2018-01-18 DIAGNOSIS — R1909 Other intra-abdominal and pelvic swelling, mass and lump: Secondary | ICD-10-CM | POA: Insufficient documentation

## 2018-01-18 DIAGNOSIS — K219 Gastro-esophageal reflux disease without esophagitis: Secondary | ICD-10-CM | POA: Insufficient documentation

## 2018-01-18 MED ORDER — TRIAMCINOLONE ACETONIDE 40 MG/ML IJ SUSP (RADIOLOGY)
60.0000 mg | Freq: Once | INTRAMUSCULAR | Status: AC
  Administered 2018-01-18: 60 mg via EPIDURAL

## 2018-01-18 MED ORDER — IOPAMIDOL (ISOVUE-M 300) INJECTION 61%
1.0000 mL | Freq: Once | INTRAMUSCULAR | Status: AC | PRN
  Administered 2018-01-18: 1 mL via EPIDURAL

## 2018-01-18 NOTE — Discharge Instructions (Signed)

## 2018-01-23 DIAGNOSIS — Z6824 Body mass index (BMI) 24.0-24.9, adult: Secondary | ICD-10-CM | POA: Diagnosis not present

## 2018-01-23 DIAGNOSIS — N6312 Unspecified lump in the right breast, upper inner quadrant: Secondary | ICD-10-CM | POA: Diagnosis not present

## 2018-01-25 ENCOUNTER — Other Ambulatory Visit: Payer: Self-pay | Admitting: General Practice

## 2018-01-25 DIAGNOSIS — Z853 Personal history of malignant neoplasm of breast: Secondary | ICD-10-CM

## 2018-01-25 DIAGNOSIS — N6312 Unspecified lump in the right breast, upper inner quadrant: Secondary | ICD-10-CM

## 2018-01-25 HISTORY — DX: Personal history of malignant neoplasm of breast: Z85.3

## 2018-01-28 DIAGNOSIS — M47812 Spondylosis without myelopathy or radiculopathy, cervical region: Secondary | ICD-10-CM | POA: Diagnosis not present

## 2018-01-28 DIAGNOSIS — Z6825 Body mass index (BMI) 25.0-25.9, adult: Secondary | ICD-10-CM | POA: Diagnosis not present

## 2018-01-28 DIAGNOSIS — S39012A Strain of muscle, fascia and tendon of lower back, initial encounter: Secondary | ICD-10-CM | POA: Diagnosis not present

## 2018-01-30 ENCOUNTER — Other Ambulatory Visit: Payer: Self-pay | Admitting: General Practice

## 2018-01-30 ENCOUNTER — Ambulatory Visit
Admission: RE | Admit: 2018-01-30 | Discharge: 2018-01-30 | Disposition: A | Discharge status: Home / Self Care | Payer: BLUE CROSS/BLUE SHIELD | Source: Ambulatory Visit | Attending: General Practice | Admitting: General Practice

## 2018-01-30 DIAGNOSIS — N6312 Unspecified lump in the right breast, upper inner quadrant: Secondary | ICD-10-CM | POA: Diagnosis not present

## 2018-01-30 DIAGNOSIS — R922 Inconclusive mammogram: Secondary | ICD-10-CM | POA: Diagnosis not present

## 2018-01-30 DIAGNOSIS — N6310 Unspecified lump in the right breast, unspecified quadrant: Secondary | ICD-10-CM

## 2018-02-01 ENCOUNTER — Ambulatory Visit
Admission: RE | Admit: 2018-02-01 | Discharge: 2018-02-01 | Disposition: A | Discharge status: Home / Self Care | Payer: BLUE CROSS/BLUE SHIELD | Source: Ambulatory Visit | Attending: General Practice | Admitting: General Practice

## 2018-02-01 ENCOUNTER — Other Ambulatory Visit: Payer: Self-pay | Admitting: General Practice

## 2018-02-01 DIAGNOSIS — N6310 Unspecified lump in the right breast, unspecified quadrant: Secondary | ICD-10-CM

## 2018-02-01 DIAGNOSIS — C50211 Malignant neoplasm of upper-inner quadrant of right female breast: Secondary | ICD-10-CM | POA: Diagnosis not present

## 2018-02-01 DIAGNOSIS — N6312 Unspecified lump in the right breast, upper inner quadrant: Secondary | ICD-10-CM | POA: Diagnosis not present

## 2018-02-08 ENCOUNTER — Other Ambulatory Visit: Payer: Self-pay | Admitting: General Surgery

## 2018-02-08 ENCOUNTER — Telehealth: Payer: Self-pay | Admitting: Hematology and Oncology

## 2018-02-08 DIAGNOSIS — C50919 Malignant neoplasm of unspecified site of unspecified female breast: Secondary | ICD-10-CM

## 2018-02-08 DIAGNOSIS — C779 Secondary and unspecified malignant neoplasm of lymph node, unspecified: Principal | ICD-10-CM

## 2018-02-08 DIAGNOSIS — C50211 Malignant neoplasm of upper-inner quadrant of right female breast: Secondary | ICD-10-CM

## 2018-02-08 DIAGNOSIS — C50911 Malignant neoplasm of unspecified site of right female breast: Secondary | ICD-10-CM

## 2018-02-08 NOTE — Telephone Encounter (Signed)
Urgent appts have been scheduled for the pt to see Dr. Lindi Adie on 2/20 at 230pm and genetics with Roma Kayser on Monday 2/18 at 3pm. Referrals came from Dr. Donne Hazel at [facility]. Eliezer Lofts will notify the office regarding these appts.

## 2018-02-11 ENCOUNTER — Other Ambulatory Visit: Payer: BLUE CROSS/BLUE SHIELD

## 2018-02-11 ENCOUNTER — Encounter: Payer: BLUE CROSS/BLUE SHIELD | Admitting: Genetic Counselor

## 2018-02-12 ENCOUNTER — Inpatient Hospital Stay: Payer: BLUE CROSS/BLUE SHIELD | Attending: Genetic Counselor | Admitting: Genetic Counselor

## 2018-02-12 ENCOUNTER — Encounter: Payer: Self-pay | Admitting: Genetic Counselor

## 2018-02-12 ENCOUNTER — Inpatient Hospital Stay: Payer: BLUE CROSS/BLUE SHIELD

## 2018-02-12 DIAGNOSIS — Z315 Encounter for genetic counseling: Secondary | ICD-10-CM

## 2018-02-12 DIAGNOSIS — C50211 Malignant neoplasm of upper-inner quadrant of right female breast: Secondary | ICD-10-CM | POA: Insufficient documentation

## 2018-02-12 DIAGNOSIS — Z5111 Encounter for antineoplastic chemotherapy: Secondary | ICD-10-CM | POA: Insufficient documentation

## 2018-02-12 DIAGNOSIS — Z800 Family history of malignant neoplasm of digestive organs: Secondary | ICD-10-CM | POA: Insufficient documentation

## 2018-02-12 DIAGNOSIS — Z801 Family history of malignant neoplasm of trachea, bronchus and lung: Secondary | ICD-10-CM

## 2018-02-12 DIAGNOSIS — C50919 Malignant neoplasm of unspecified site of unspecified female breast: Secondary | ICD-10-CM | POA: Insufficient documentation

## 2018-02-12 DIAGNOSIS — Z803 Family history of malignant neoplasm of breast: Secondary | ICD-10-CM | POA: Diagnosis not present

## 2018-02-12 DIAGNOSIS — Z171 Estrogen receptor negative status [ER-]: Secondary | ICD-10-CM
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
## 2018-02-13 ENCOUNTER — Encounter: Payer: Self-pay | Admitting: Adult Health

## 2018-02-13 ENCOUNTER — Encounter (HOSPITAL_BASED_OUTPATIENT_CLINIC_OR_DEPARTMENT_OTHER): Payer: Self-pay | Admitting: *Deleted

## 2018-02-13 ENCOUNTER — Inpatient Hospital Stay: Admission: RE | Admit: 2018-02-13 | Payer: BLUE CROSS/BLUE SHIELD | Source: Ambulatory Visit

## 2018-02-13 ENCOUNTER — Encounter: Payer: Self-pay | Admitting: *Deleted

## 2018-02-13 ENCOUNTER — Other Ambulatory Visit: Payer: BLUE CROSS/BLUE SHIELD

## 2018-02-13 ENCOUNTER — Encounter: Payer: BLUE CROSS/BLUE SHIELD | Admitting: Genetic Counselor

## 2018-02-13 ENCOUNTER — Other Ambulatory Visit: Payer: Self-pay

## 2018-02-13 ENCOUNTER — Telehealth: Payer: Self-pay | Admitting: Hematology and Oncology

## 2018-02-13 ENCOUNTER — Inpatient Hospital Stay (HOSPITAL_BASED_OUTPATIENT_CLINIC_OR_DEPARTMENT_OTHER): Payer: BLUE CROSS/BLUE SHIELD | Admitting: Hematology and Oncology

## 2018-02-13 VITALS — BP 109/62 | HR 67 | Temp 98.4°F | Resp 20 | Ht 61.0 in | Wt 125.3 lb

## 2018-02-13 DIAGNOSIS — Z171 Estrogen receptor negative status [ER-]: Secondary | ICD-10-CM

## 2018-02-13 DIAGNOSIS — C50211 Malignant neoplasm of upper-inner quadrant of right female breast: Secondary | ICD-10-CM | POA: Diagnosis not present

## 2018-02-13 DIAGNOSIS — Z803 Family history of malignant neoplasm of breast: Secondary | ICD-10-CM | POA: Diagnosis not present

## 2018-02-13 DIAGNOSIS — Z800 Family history of malignant neoplasm of digestive organs: Secondary | ICD-10-CM

## 2018-02-13 DIAGNOSIS — Z801 Family history of malignant neoplasm of trachea, bronchus and lung: Secondary | ICD-10-CM | POA: Diagnosis not present

## 2018-02-13 DIAGNOSIS — Z5111 Encounter for antineoplastic chemotherapy: Secondary | ICD-10-CM | POA: Diagnosis not present

## 2018-02-13 MED ORDER — LIDOCAINE-PRILOCAINE 2.5-2.5 % EX CREA
TOPICAL_CREAM | CUTANEOUS | 3 refills | Status: DC
Start: 2018-02-13 — End: 2018-02-13

## 2018-02-13 MED ORDER — DEXAMETHASONE 4 MG PO TABS
ORAL_TABLET | ORAL | 0 refills | Status: DC
Start: 2018-02-13 — End: 2018-02-13

## 2018-02-13 MED ORDER — ONDANSETRON HCL 8 MG PO TABS: 8.0000 mg | ORAL_TABLET | Freq: Two times a day (BID) | ORAL | 1 refills | Status: DC | PRN

## 2018-02-13 MED ORDER — PROCHLORPERAZINE MALEATE 10 MG PO TABS: 10.0000 mg | ORAL_TABLET | Freq: Four times a day (QID) | ORAL | 1 refills | Status: DC | PRN

## 2018-02-13 MED ORDER — LORAZEPAM 0.5 MG PO TABS: 0.5000 mg | ORAL_TABLET | Freq: Every evening | ORAL | 0 refills | Status: DC | PRN
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
## 2018-02-13 NOTE — Telephone Encounter (Signed)
Gave avs and calendar waiting for 2/27

## 2018-02-13 NOTE — Assessment & Plan Note (Signed)
02/01/2018:Screening detected right breast mass in the upper inner quadrant measuring 1.5 cm, with additional enhancement it measured 2.2 cm.  No axillary lymph nodes.  Biopsy revealed IDC with DCIS, grade 3, ER 0%, PR 0%, HER-2 negative, Ki-67 80%, T1CN0 stage Ib clinical stage  Pathology and radiology counseling: Discussed with the patient, the details of pathology including the type of breast cancer,the clinical staging, the significance of ER, PR and HER-2/neu receptors and the implications for treatment. After reviewing the pathology in detail, we proceeded to discuss the different treatment options between surgery, radiation, chemotherapy.  Recommendation: 1.  Genetic counseling and testing 2. neoadjuvant chemotherapy with dose dense Adriamycin and Cytoxan every 2 weeks x4 followed by Taxol weekly x12  accompanied by carboplatin every 3 weeks x4 3. Followed by surgical options dependent on whether she has genetic mutation. 4.  Adjuvant radiation if she undergoes breast conserving surgery  Chemotherapy Counseling: I discussed the risks and benefits of chemotherapy including the risks of nausea/ vomiting, risk of infection from low WBC count, fatigue due to chemo or anemia, bruising or bleeding due to low platelets, mouth sores, loss/ change in taste and decreased appetite. Liver and kidney function will be monitored through out chemotherapy as abnormalities in liver and kidney function may be a side effect of treatment. Cardiac dysfunction due to Adriamycin was discussed in detail. Risk of permanent bone marrow dysfunction and leukemia due to chemo were also discussed.  Plan: 1.  Staging CT chest abdomen pelvis and bone scan 2. port placement 3.  Echocardiogram 4.  Chemo class  Start chemotherapy next Wednesday.

## 2018-02-13 NOTE — Progress Notes (Signed)
START ON PATHWAY REGIMEN - Breast     A cycle is every 21 days:     Paclitaxel      Carboplatin      Doxorubicin      Cyclophosphamide   **Always confirm dose/schedule in your pharmacy ordering system**    Patient Characteristics: Preoperative or Nonsurgical Candidate (Clinical Staging), Neoadjuvant Therapy followed by Surgery, Invasive Disease, Chemotherapy, HER2 Negative/Unknown/Equivocal, ER Negative/Unknown, Platinum Therapy Indicated Therapeutic Status: Preoperative or Nonsurgical Candidate (Clinical Staging) AJCC M Category: cM0 AJCC Grade: G3 Breast Surgical Plan: Neoadjuvant Therapy followed by Surgery ER Status: Negative (-) AJCC 8 Stage Grouping: IIB HER2 Status: Negative (-) AJCC T Category: cT2 AJCC N Category: cN0 PR Status: Negative (-) Type of Therapy: Platinum Therapy Indicated Intent of Therapy: Curative Intent, Discussed with Patient

## 2018-02-15 ENCOUNTER — Encounter (HOSPITAL_COMMUNITY): Payer: Self-pay

## 2018-02-15 ENCOUNTER — Ambulatory Visit (HOSPITAL_COMMUNITY): Payer: BLUE CROSS/BLUE SHIELD

## 2018-02-15 ENCOUNTER — Telehealth (HOSPITAL_COMMUNITY): Payer: Self-pay | Admitting: General Surgery

## 2018-02-15 ENCOUNTER — Other Ambulatory Visit (HOSPITAL_COMMUNITY): Payer: BLUE CROSS/BLUE SHIELD

## 2018-02-16 ENCOUNTER — Ambulatory Visit (HOSPITAL_COMMUNITY)
Admission: RE | Admit: 2018-02-16 | Discharge: 2018-02-16 | Disposition: A | Discharge status: Home / Self Care | Payer: BLUE CROSS/BLUE SHIELD | Source: Ambulatory Visit | Attending: General Surgery | Admitting: General Surgery

## 2018-02-16 DIAGNOSIS — C50919 Malignant neoplasm of unspecified site of unspecified female breast: Secondary | ICD-10-CM | POA: Diagnosis not present

## 2018-02-16 DIAGNOSIS — C50211 Malignant neoplasm of upper-inner quadrant of right female breast: Secondary | ICD-10-CM | POA: Diagnosis not present

## 2018-02-16 MED ORDER — GADOBENATE DIMEGLUMINE 529 MG/ML IV SOLN
15.0000 mL | Freq: Once | INTRAVENOUS | Status: AC | PRN
  Administered 2018-02-16: 12 mL via INTRAVENOUS

## 2018-02-18 ENCOUNTER — Encounter (INDEPENDENT_AMBULATORY_CARE_PROVIDER_SITE_OTHER): Payer: Self-pay

## 2018-02-18 ENCOUNTER — Inpatient Hospital Stay: Payer: BLUE CROSS/BLUE SHIELD

## 2018-02-18 ENCOUNTER — Encounter: Payer: BLUE CROSS/BLUE SHIELD | Admitting: Genetic Counselor

## 2018-02-18 ENCOUNTER — Other Ambulatory Visit: Payer: BLUE CROSS/BLUE SHIELD

## 2018-02-18 ENCOUNTER — Ambulatory Visit (HOSPITAL_COMMUNITY)
Admission: RE | Admit: 2018-02-18 | Discharge: 2018-02-18 | Disposition: A | Discharge status: Home / Self Care | Payer: BLUE CROSS/BLUE SHIELD | Source: Ambulatory Visit | Attending: Hematology and Oncology | Admitting: Hematology and Oncology

## 2018-02-18 DIAGNOSIS — Z171 Estrogen receptor negative status [ER-]: Secondary | ICD-10-CM | POA: Diagnosis not present

## 2018-02-18 DIAGNOSIS — C50211 Malignant neoplasm of upper-inner quadrant of right female breast: Secondary | ICD-10-CM | POA: Diagnosis not present

## 2018-02-18 DIAGNOSIS — E119 Type 2 diabetes mellitus without complications: Secondary | ICD-10-CM | POA: Diagnosis not present

## 2018-02-18 DIAGNOSIS — Z5111 Encounter for antineoplastic chemotherapy: Secondary | ICD-10-CM | POA: Diagnosis not present

## 2018-02-18 LAB — CBC WITH DIFFERENTIAL (CANCER CENTER ONLY)
Basophils Absolute: 0 10*3/uL (ref 0.0–0.1)
Basophils Relative: 1 %
EOS PCT: 1 %
Eosinophils Absolute: 0.1 10*3/uL (ref 0.0–0.5)
HCT: 40.1 % (ref 34.8–46.6)
HEMOGLOBIN: 13.6 g/dL (ref 11.6–15.9)
LYMPHS ABS: 1.2 10*3/uL (ref 0.9–3.3)
Lymphocytes Relative: 20 %
MCH: 29.6 pg (ref 25.1–34.0)
MCHC: 33.9 g/dL (ref 31.5–36.0)
MCV: 87.1 fL (ref 79.5–101.0)
Monocytes Absolute: 0.4 10*3/uL (ref 0.1–0.9)
Monocytes Relative: 6 %
NEUTROS PCT: 72 %
Neutro Abs: 4.5 10*3/uL (ref 1.5–6.5)
PLATELETS: 244 10*3/uL (ref 145–400)
RBC: 4.6 MIL/uL (ref 3.70–5.45)
RDW: 12.8 % (ref 11.2–14.5)
WBC: 6.3 10*3/uL (ref 3.9–10.3)

## 2018-02-18 LAB — CMP (CANCER CENTER ONLY)
ALT: 11 U/L (ref 0–55)
AST: 12 U/L (ref 5–34)
Albumin: 4.1 g/dL (ref 3.5–5.0)
Alkaline Phosphatase: 46 U/L (ref 40–150)
Anion gap: 8 (ref 3–11)
BILIRUBIN TOTAL: 0.4 mg/dL (ref 0.2–1.2)
BUN: 9 mg/dL (ref 7–26)
CO2: 26 mmol/L (ref 22–29)
CREATININE: 0.85 mg/dL (ref 0.60–1.10)
Calcium: 9.8 mg/dL (ref 8.4–10.4)
Chloride: 105 mmol/L (ref 98–109)
Glucose, Bld: 81 mg/dL (ref 70–140)
Potassium: 3.8 mmol/L (ref 3.5–5.1)
Sodium: 139 mmol/L (ref 136–145)
Total Protein: 7.3 g/dL (ref 6.4–8.3)

## 2018-02-18 NOTE — Progress Notes (Signed)
  Echocardiogram 2D Echocardiogram has been performed.  [name] 02/18/2018, 12:15 PM

## 2018-02-19 ENCOUNTER — Encounter (HOSPITAL_COMMUNITY)
Admission: RE | Admit: 2018-02-19 | Discharge: 2018-02-19 | Disposition: A | Discharge status: Home / Self Care | Payer: BLUE CROSS/BLUE SHIELD | Source: Ambulatory Visit | Attending: Hematology and Oncology | Admitting: Hematology and Oncology

## 2018-02-19 ENCOUNTER — Other Ambulatory Visit: Payer: Self-pay | Admitting: Hematology and Oncology

## 2018-02-19 ENCOUNTER — Encounter (HOSPITAL_COMMUNITY): Payer: Self-pay

## 2018-02-19 ENCOUNTER — Ambulatory Visit (HOSPITAL_COMMUNITY)
Admission: RE | Admit: 2018-02-19 | Discharge: 2018-02-19 | Disposition: A | Discharge status: Home / Self Care | Payer: BLUE CROSS/BLUE SHIELD | Source: Ambulatory Visit | Attending: Hematology and Oncology | Admitting: Hematology and Oncology

## 2018-02-19 DIAGNOSIS — C50911 Malignant neoplasm of unspecified site of right female breast: Secondary | ICD-10-CM | POA: Diagnosis not present

## 2018-02-19 DIAGNOSIS — C50919 Malignant neoplasm of unspecified site of unspecified female breast: Secondary | ICD-10-CM | POA: Diagnosis not present

## 2018-02-19 DIAGNOSIS — C50211 Malignant neoplasm of upper-inner quadrant of right female breast: Secondary | ICD-10-CM | POA: Diagnosis not present

## 2018-02-19 DIAGNOSIS — Z171 Estrogen receptor negative status [ER-]: Secondary | ICD-10-CM | POA: Diagnosis not present

## 2018-02-19 MED ORDER — TECHNETIUM TC 99M MEDRONATE IV KIT
21.6000 | PACK | Freq: Once | INTRAVENOUS | Status: AC | PRN
  Administered 2018-02-19: 21.6 via INTRAVENOUS

## 2018-02-19 MED ORDER — IOPAMIDOL (ISOVUE-300) INJECTION 61%
INTRAVENOUS | Status: AC
  Filled 2018-02-19: qty 100

## 2018-02-19 MED ORDER — SODIUM CHLORIDE 0.9 % IJ SOLN
INTRAMUSCULAR | Status: AC
  Filled 2018-02-19: qty 50

## 2018-02-19 MED ORDER — IOPAMIDOL (ISOVUE-300) INJECTION 61%
100.0000 mL | Freq: Once | INTRAVENOUS | Status: AC | PRN
  Administered 2018-02-19: 100 mL via INTRAVENOUS

## 2018-02-19 MED ORDER — IOPAMIDOL (ISOVUE-300) INJECTION 61%
INTRAVENOUS | Status: AC
  Filled 2018-02-19: qty 30

## 2018-02-19 MED ORDER — IOPAMIDOL (ISOVUE-300) INJECTION 61%
30.0000 mL | Freq: Once | INTRAVENOUS | Status: DC | PRN
  Administered 2018-02-19: 30 mL via ORAL
  Filled 2018-02-19: qty 30

## 2018-02-19 NOTE — Pre-Procedure Instructions (Signed)
Patient called [facility] and stated that she forgot to come get her pre-surgery ERAS drink. Lives in [place]. Checked with Dr. Gifford Shave. OK for patient to drink equivalent ounces of water or clear gatorade at designated time per ERAS instructions.

## 2018-02-19 NOTE — Pre-Procedure Instructions (Signed)
Pt. forgot to come pick up Ensure pre-surgery drink; will drink 10 oz. water or clear Gatorade by 0515 DOS.

## 2018-02-20 ENCOUNTER — Encounter (HOSPITAL_BASED_OUTPATIENT_CLINIC_OR_DEPARTMENT_OTHER)
Admission: RE | Disposition: A | Discharge status: Home / Self Care | Payer: Self-pay | Source: Ambulatory Visit | Attending: General Surgery

## 2018-02-20 ENCOUNTER — Inpatient Hospital Stay: Payer: BLUE CROSS/BLUE SHIELD

## 2018-02-20 ENCOUNTER — Ambulatory Visit (HOSPITAL_COMMUNITY): Payer: BLUE CROSS/BLUE SHIELD

## 2018-02-20 ENCOUNTER — Encounter: Payer: Self-pay | Admitting: *Deleted

## 2018-02-20 ENCOUNTER — Ambulatory Visit (HOSPITAL_BASED_OUTPATIENT_CLINIC_OR_DEPARTMENT_OTHER)
Admission: RE | Admit: 2018-02-20 | Discharge: 2018-02-20 | Disposition: A | Discharge status: Home / Self Care | Payer: BLUE CROSS/BLUE SHIELD | Source: Ambulatory Visit | Attending: General Surgery | Admitting: General Surgery

## 2018-02-20 ENCOUNTER — Other Ambulatory Visit: Payer: Self-pay

## 2018-02-20 ENCOUNTER — Ambulatory Visit (HOSPITAL_BASED_OUTPATIENT_CLINIC_OR_DEPARTMENT_OTHER): Payer: BLUE CROSS/BLUE SHIELD | Admitting: Anesthesiology

## 2018-02-20 ENCOUNTER — Encounter (HOSPITAL_BASED_OUTPATIENT_CLINIC_OR_DEPARTMENT_OTHER): Payer: Self-pay | Admitting: Anesthesiology

## 2018-02-20 ENCOUNTER — Telehealth: Payer: Self-pay | Admitting: Hematology and Oncology

## 2018-02-20 ENCOUNTER — Ambulatory Visit (HOSPITAL_BASED_OUTPATIENT_CLINIC_OR_DEPARTMENT_OTHER): Payer: BLUE CROSS/BLUE SHIELD | Admitting: Genetic Counselor

## 2018-02-20 VITALS — BP 103/64 | HR 104 | Temp 98.6°F | Resp 20

## 2018-02-20 DIAGNOSIS — E119 Type 2 diabetes mellitus without complications: Secondary | ICD-10-CM | POA: Diagnosis not present

## 2018-02-20 DIAGNOSIS — E876 Hypokalemia: Secondary | ICD-10-CM | POA: Diagnosis not present

## 2018-02-20 DIAGNOSIS — R110 Nausea: Secondary | ICD-10-CM

## 2018-02-20 DIAGNOSIS — Z1509 Genetic susceptibility to other malignant neoplasm: Secondary | ICD-10-CM

## 2018-02-20 DIAGNOSIS — K219 Gastro-esophageal reflux disease without esophagitis: Secondary | ICD-10-CM | POA: Insufficient documentation

## 2018-02-20 DIAGNOSIS — C50211 Malignant neoplasm of upper-inner quadrant of right female breast: Secondary | ICD-10-CM | POA: Diagnosis not present

## 2018-02-20 DIAGNOSIS — Z801 Family history of malignant neoplasm of trachea, bronchus and lung: Secondary | ICD-10-CM | POA: Diagnosis not present

## 2018-02-20 DIAGNOSIS — Z1379 Encounter for other screening for genetic and chromosomal anomalies: Secondary | ICD-10-CM | POA: Diagnosis not present

## 2018-02-20 DIAGNOSIS — J929 Pleural plaque without asbestos: Secondary | ICD-10-CM | POA: Diagnosis not present

## 2018-02-20 DIAGNOSIS — Z171 Estrogen receptor negative status [ER-]: Secondary | ICD-10-CM

## 2018-02-20 DIAGNOSIS — Z315 Encounter for genetic counseling: Secondary | ICD-10-CM

## 2018-02-20 DIAGNOSIS — E039 Hypothyroidism, unspecified: Secondary | ICD-10-CM | POA: Diagnosis not present

## 2018-02-20 DIAGNOSIS — Z803 Family history of malignant neoplasm of breast: Secondary | ICD-10-CM

## 2018-02-20 DIAGNOSIS — Z452 Encounter for adjustment and management of vascular access device: Secondary | ICD-10-CM | POA: Diagnosis not present

## 2018-02-20 DIAGNOSIS — Z884 Allergy status to anesthetic agent status: Secondary | ICD-10-CM | POA: Diagnosis not present

## 2018-02-20 DIAGNOSIS — Z800 Family history of malignant neoplasm of digestive organs: Secondary | ICD-10-CM | POA: Diagnosis not present

## 2018-02-20 DIAGNOSIS — Z95828 Presence of other vascular implants and grafts: Secondary | ICD-10-CM

## 2018-02-20 DIAGNOSIS — Z1501 Genetic susceptibility to malignant neoplasm of breast: Secondary | ICD-10-CM

## 2018-02-20 DIAGNOSIS — R002 Palpitations: Secondary | ICD-10-CM | POA: Diagnosis not present

## 2018-02-20 DIAGNOSIS — Z5111 Encounter for antineoplastic chemotherapy: Secondary | ICD-10-CM | POA: Diagnosis not present

## 2018-02-20 HISTORY — PX: PORTACATH PLACEMENT: SHX2246

## 2018-02-20 SURGERY — INSERTION, TUNNELED CENTRAL VENOUS DEVICE, WITH PORT
Anesthesia: General | Site: Chest

## 2018-02-20 MED ORDER — PEGFILGRASTIM 6 MG/0.6ML ~~LOC~~ PSKT: 6.0000 mg | PREFILLED_SYRINGE | Freq: Once | SUBCUTANEOUS | Status: DC

## 2018-02-20 MED ORDER — BUPIVACAINE HCL (PF) 0.25 % IJ SOLN
INTRAMUSCULAR | Status: AC
  Filled 2018-02-20: qty 30

## 2018-02-20 MED ORDER — HEPARIN SOD (PORK) LOCK FLUSH 100 UNIT/ML IV SOLN
INTRAVENOUS | Status: DC | PRN
  Administered 2018-02-20: 500 [IU] via INTRAVENOUS

## 2018-02-20 MED ORDER — SCOPOLAMINE 1 MG/3DAYS TD PT72
MEDICATED_PATCH | TRANSDERMAL | Status: AC
  Filled 2018-02-20: qty 1

## 2018-02-20 MED ORDER — DOXORUBICIN HCL CHEMO IV INJECTION 2 MG/ML
60.0000 mg/m2 | Freq: Once | INTRAVENOUS | Status: AC
  Administered 2018-02-20: 94 mg via INTRAVENOUS
  Filled 2018-02-20: qty 47

## 2018-02-20 MED ORDER — ACETAMINOPHEN 500 MG PO TABS
ORAL_TABLET | ORAL | Status: AC
  Filled 2018-02-20: qty 2

## 2018-02-20 MED ORDER — FENTANYL CITRATE (PF) 100 MCG/2ML IJ SOLN
25.0000 ug | INTRAMUSCULAR | Status: DC | PRN
  Administered 2018-02-20 (×2): 25 ug via INTRAVENOUS
  Administered 2018-02-20: 50 ug via INTRAVENOUS

## 2018-02-20 MED ORDER — FENTANYL CITRATE (PF) 100 MCG/2ML IJ SOLN
INTRAMUSCULAR | Status: AC
  Filled 2018-02-20: qty 2

## 2018-02-20 MED ORDER — HEPARIN (PORCINE) IN NACL 2-0.9 UNIT/ML-% IJ SOLN
INTRAMUSCULAR | Status: AC | PRN
  Administered 2018-02-20: 1

## 2018-02-20 MED ORDER — MEPERIDINE HCL 25 MG/ML IJ SOLN: 6.2500 mg | INTRAMUSCULAR | Status: DC | PRN

## 2018-02-20 MED ORDER — PALONOSETRON HCL INJECTION 0.25 MG/5ML
0.2500 mg | Freq: Once | INTRAVENOUS | Status: AC
  Administered 2018-02-20: 0.25 mg via INTRAVENOUS

## 2018-02-20 MED ORDER — ONDANSETRON HCL 4 MG/2ML IJ SOLN
8.0000 mg | Freq: Once | INTRAMUSCULAR | Status: AC
  Administered 2018-02-20: 8 mg via INTRAVENOUS

## 2018-02-20 MED ORDER — LIDOCAINE 2% (20 MG/ML) 5 ML SYRINGE
INTRAMUSCULAR | Status: AC
  Filled 2018-02-20: qty 5

## 2018-02-20 MED ORDER — MIDAZOLAM HCL 2 MG/2ML IJ SOLN: 1.0000 mg | INTRAMUSCULAR | Status: DC | PRN

## 2018-02-20 MED ORDER — CELECOXIB 200 MG PO CAPS
200.0000 mg | ORAL_CAPSULE | ORAL | Status: AC
  Administered 2018-02-20: 200 mg via ORAL

## 2018-02-20 MED ORDER — LACTATED RINGERS IV SOLN
INTRAVENOUS | Status: DC
  Administered 2018-02-20 (×3): via INTRAVENOUS

## 2018-02-20 MED ORDER — SODIUM CHLORIDE 0.9 % IV SOLN
600.0000 mg/m2 | Freq: Once | INTRAVENOUS | Status: AC
  Administered 2018-02-20: 940 mg via INTRAVENOUS
  Filled 2018-02-20: qty 47

## 2018-02-20 MED ORDER — SODIUM CHLORIDE 0.9 % IV SOLN
Freq: Once | INTRAVENOUS | Status: AC
  Administered 2018-02-20: 14:00:00 via INTRAVENOUS

## 2018-02-20 MED ORDER — OXYCODONE HCL 5 MG/5ML PO SOLN: 5.0000 mg | Freq: Once | ORAL | Status: AC | PRN

## 2018-02-20 MED ORDER — SCOPOLAMINE 1 MG/3DAYS TD PT72
1.0000 | MEDICATED_PATCH | Freq: Once | TRANSDERMAL | Status: DC | PRN
  Administered 2018-02-20: 1.5 mg via TRANSDERMAL

## 2018-02-20 MED ORDER — CELECOXIB 200 MG PO CAPS
ORAL_CAPSULE | ORAL | Status: AC
  Filled 2018-02-20: qty 1

## 2018-02-20 MED ORDER — ACETAMINOPHEN 500 MG PO TABS
1000.0000 mg | ORAL_TABLET | ORAL | Status: AC
  Administered 2018-02-20: 1000 mg via ORAL

## 2018-02-20 MED ORDER — DEXAMETHASONE SODIUM PHOSPHATE 4 MG/ML IJ SOLN
INTRAMUSCULAR | Status: DC | PRN
  Administered 2018-02-20: 10 mg via INTRAVENOUS

## 2018-02-20 MED ORDER — MIDAZOLAM HCL 2 MG/2ML IJ SOLN
INTRAMUSCULAR | Status: AC
  Filled 2018-02-20: qty 2

## 2018-02-20 MED ORDER — PROPOFOL 10 MG/ML IV BOLUS
INTRAVENOUS | Status: DC | PRN
  Administered 2018-02-20: 150 mg via INTRAVENOUS

## 2018-02-20 MED ORDER — HEPARIN (PORCINE) IN NACL 2-0.9 UNIT/ML-% IJ SOLN
INTRAMUSCULAR | Status: AC
  Filled 2018-02-20: qty 500

## 2018-02-20 MED ORDER — ONDANSETRON HCL 4 MG/2ML IJ SOLN
INTRAMUSCULAR | Status: AC
Start: 2018-02-20 — End: ?
  Filled 2018-02-20: qty 2

## 2018-02-20 MED ORDER — SCOPOLAMINE 1 MG/3DAYS TD PT72
MEDICATED_PATCH | TRANSDERMAL | Status: DC | PRN
  Administered 2018-02-20: 1 via TRANSDERMAL

## 2018-02-20 MED ORDER — FENTANYL CITRATE (PF) 100 MCG/2ML IJ SOLN
INTRAMUSCULAR | Status: DC | PRN
  Administered 2018-02-20: 100 ug via INTRAVENOUS

## 2018-02-20 MED ORDER — FOSAPREPITANT DIMEGLUMINE INJECTION 150 MG
Freq: Once | INTRAVENOUS | Status: AC
  Administered 2018-02-20: 14:00:00 via INTRAVENOUS
  Filled 2018-02-20: qty 5

## 2018-02-20 MED ORDER — HEPARIN SOD (PORK) LOCK FLUSH 100 UNIT/ML IV SOLN
INTRAVENOUS | Status: AC
  Filled 2018-02-20: qty 5

## 2018-02-20 MED ORDER — OXYCODONE-ACETAMINOPHEN 10-325 MG PO TABS: 1.0000 | ORAL_TABLET | Freq: Four times a day (QID) | ORAL | 0 refills | Status: DC | PRN

## 2018-02-20 MED ORDER — GABAPENTIN 300 MG PO CAPS
300.0000 mg | ORAL_CAPSULE | ORAL | Status: AC
  Administered 2018-02-20: 300 mg via ORAL

## 2018-02-20 MED ORDER — ONDANSETRON HCL 4 MG/2ML IJ SOLN
INTRAMUSCULAR | Status: AC
  Filled 2018-02-20: qty 4

## 2018-02-20 MED ORDER — ACETAMINOPHEN 325 MG PO TABS
650.0000 mg | ORAL_TABLET | Freq: Once | ORAL | Status: AC
  Administered 2018-02-20: 650 mg via ORAL

## 2018-02-20 MED ORDER — GABAPENTIN 300 MG PO CAPS
ORAL_CAPSULE | ORAL | Status: AC
  Filled 2018-02-20: qty 1

## 2018-02-20 MED ORDER — BUPIVACAINE HCL (PF) 0.25 % IJ SOLN
INTRAMUSCULAR | Status: DC | PRN
  Administered 2018-02-20: 8 mL

## 2018-02-20 MED ORDER — ENSURE PRE-SURGERY PO LIQD: 592.0000 mL | Freq: Once | ORAL | Status: DC

## 2018-02-20 MED ORDER — FENTANYL CITRATE (PF) 100 MCG/2ML IJ SOLN: 50.0000 ug | INTRAMUSCULAR | Status: DC | PRN

## 2018-02-20 MED ORDER — MIDAZOLAM HCL 5 MG/5ML IJ SOLN
INTRAMUSCULAR | Status: DC | PRN
  Administered 2018-02-20: 2 mg via INTRAVENOUS

## 2018-02-20 MED ORDER — SODIUM CHLORIDE 0.9% FLUSH
10.0000 mL | INTRAVENOUS | Status: DC | PRN
  Administered 2018-02-20: 10 mL
  Filled 2018-02-20: qty 10

## 2018-02-20 MED ORDER — OXYCODONE HCL 5 MG PO TABS
ORAL_TABLET | ORAL | Status: AC
  Filled 2018-02-20: qty 1

## 2018-02-20 MED ORDER — OXYCODONE HCL 5 MG PO TABS
5.0000 mg | ORAL_TABLET | Freq: Once | ORAL | Status: AC | PRN
  Administered 2018-02-20: 5 mg via ORAL

## 2018-02-20 MED ORDER — DEXAMETHASONE SODIUM PHOSPHATE 10 MG/ML IJ SOLN
INTRAMUSCULAR | Status: AC
  Filled 2018-02-20: qty 1

## 2018-02-20 MED ORDER — PROMETHAZINE HCL 25 MG/ML IJ SOLN: 6.2500 mg | INTRAMUSCULAR | Status: DC | PRN

## 2018-02-20 MED ORDER — PALONOSETRON HCL INJECTION 0.25 MG/5ML
INTRAVENOUS | Status: AC
  Filled 2018-02-20: qty 5

## 2018-02-20 MED ORDER — ACETAMINOPHEN 325 MG PO TABS
ORAL_TABLET | ORAL | Status: AC
  Filled 2018-02-20: qty 2

## 2018-02-20 MED ORDER — CEFAZOLIN SODIUM-DEXTROSE 2-4 GM/100ML-% IV SOLN
INTRAVENOUS | Status: AC
  Filled 2018-02-20: qty 100

## 2018-02-20 MED ORDER — ONDANSETRON HCL 4 MG/2ML IJ SOLN
INTRAMUSCULAR | Status: DC | PRN
  Administered 2018-02-20: 4 mg via INTRAVENOUS

## 2018-02-20 MED ORDER — CEFAZOLIN SODIUM-DEXTROSE 2-4 GM/100ML-% IV SOLN
2.0000 g | INTRAVENOUS | Status: AC
  Administered 2018-02-20: 2 g via INTRAVENOUS

## 2018-02-20 MED ORDER — HEPARIN SOD (PORK) LOCK FLUSH 100 UNIT/ML IV SOLN
500.0000 [IU] | Freq: Once | INTRAVENOUS | Status: AC | PRN
  Administered 2018-02-20: 500 [IU]
  Filled 2018-02-20: qty 5

## 2018-02-20 SURGICAL SUPPLY — 52 items
BAG DECANTER FOR FLEXI CONT (MISCELLANEOUS) ×3 IMPLANT
BENZOIN TINCTURE PRP APPL 2/3 (GAUZE/BANDAGES/DRESSINGS) ×3 IMPLANT
BLADE SURG 11 STRL SS (BLADE) ×3 IMPLANT
BLADE SURG 15 STRL LF DISP TIS (BLADE) ×1 IMPLANT
BLADE SURG 15 STRL SS (BLADE) ×2
CANISTER SUCT 1200ML W/VALVE (MISCELLANEOUS) IMPLANT
CHLORAPREP W/TINT 26ML (MISCELLANEOUS) ×3 IMPLANT
CLOSURE WOUND 1/2 X4 (GAUZE/BANDAGES/DRESSINGS) ×1
COVER BACK TABLE 60X90IN (DRAPES) ×3 IMPLANT
COVER MAYO STAND STRL (DRAPES) ×3 IMPLANT
COVER PROBE 5X48 (MISCELLANEOUS) ×2
DECANTER SPIKE VIAL GLASS SM (MISCELLANEOUS) IMPLANT
DERMABOND ADVANCED (GAUZE/BANDAGES/DRESSINGS) ×2
DERMABOND ADVANCED .7 DNX12 (GAUZE/BANDAGES/DRESSINGS) ×1 IMPLANT
DRAPE C-ARM 42X72 X-RAY (DRAPES) ×3 IMPLANT
DRAPE LAPAROSCOPIC ABDOMINAL (DRAPES) ×3 IMPLANT
DRAPE UTILITY XL STRL (DRAPES) ×3 IMPLANT
DRSG TEGADERM 4X4.75 (GAUZE/BANDAGES/DRESSINGS) ×6 IMPLANT
ELECT COATED BLADE 2.86 ST (ELECTRODE) ×3 IMPLANT
ELECT REM PT RETURN 9FT ADLT (ELECTROSURGICAL) ×3
ELECTRODE REM PT RTRN 9FT ADLT (ELECTROSURGICAL) ×1 IMPLANT
GAUZE SPONGE 4X4 12PLY STRL LF (GAUZE/BANDAGES/DRESSINGS) ×3 IMPLANT
GLOVE BIO SURGEON STRL SZ7 (GLOVE) ×6 IMPLANT
GLOVE BIOGEL PI IND STRL 7.0 (GLOVE) ×1 IMPLANT
GLOVE BIOGEL PI IND STRL 7.5 (GLOVE) ×1 IMPLANT
GLOVE BIOGEL PI INDICATOR 7.0 (GLOVE) ×2
GLOVE BIOGEL PI INDICATOR 7.5 (GLOVE) ×2
GOWN STRL REUS W/ TWL LRG LVL3 (GOWN DISPOSABLE) ×2 IMPLANT
GOWN STRL REUS W/TWL LRG LVL3 (GOWN DISPOSABLE) ×4
IV KIT MINILOC 20X1 SAFETY (NEEDLE) IMPLANT
KIT CVR 48X5XPRB PLUP LF (MISCELLANEOUS) ×1 IMPLANT
KIT PORT POWER 8FR ISP CVUE (Miscellaneous) ×3 IMPLANT
NDL SAFETY ECLIPSE 18X1.5 (NEEDLE) IMPLANT
NEEDLE HYPO 18GX1.5 SHARP (NEEDLE)
NEEDLE HYPO 25X1 1.5 SAFETY (NEEDLE) ×3 IMPLANT
PACK BASIN DAY SURGERY FS (CUSTOM PROCEDURE TRAY) ×3 IMPLANT
PENCIL BUTTON HOLSTER BLD 10FT (ELECTRODE) ×3 IMPLANT
SLEEVE SCD COMPRESS KNEE MED (MISCELLANEOUS) ×3 IMPLANT
STRIP CLOSURE SKIN 1/2X4 (GAUZE/BANDAGES/DRESSINGS) ×2 IMPLANT
SUT MON AB 4-0 PC3 18 (SUTURE) ×3 IMPLANT
SUT PROLENE 2 0 SH DA (SUTURE) ×3 IMPLANT
SUT SILK 2 0 TIES 17X18 (SUTURE)
SUT SILK 2-0 18XBRD TIE BLK (SUTURE) IMPLANT
SUT VIC AB 3-0 SH 27 (SUTURE) ×2
SUT VIC AB 3-0 SH 27X BRD (SUTURE) ×1 IMPLANT
SYR 5ML LUER SLIP (SYRINGE) ×3 IMPLANT
SYR CONTROL 10ML LL (SYRINGE) ×3 IMPLANT
TOWEL OR 17X24 6PK STRL BLUE (TOWEL DISPOSABLE) ×3 IMPLANT
TOWEL OR NON WOVEN STRL DISP B (DISPOSABLE) ×3 IMPLANT
TUBE CONNECTING 20'X1/4 (TUBING)
TUBE CONNECTING 20X1/4 (TUBING) IMPLANT
YANKAUER SUCT BULB TIP NO VENT (SUCTIONS) IMPLANT

## 2018-02-20 NOTE — Progress Notes (Signed)
Pt insurance has not currently approved her for On-Pro Neulasta. She wishes to receive Udenyca at [facility] rather then our location due to travel for her. I called [facility] and left VM. Someone called me back and informed me that would be done through their outpatient short stay and transferred me to that line where it went to VM and I left a message with my information and the patients information.  Cyndia Bent RN

## 2018-02-20 NOTE — Transfer of Care (Signed)
Immediate Anesthesia Transfer of Care Note  Patient: [name]  Procedure(s) Performed: INSERTION PORT-A-CATH WITH ULTRASOUND (N/A Chest)  Patient Location: PACU  Anesthesia Type:General  Level of Consciousness: awake  Airway & Oxygen Therapy: Patient Spontanous Breathing and Patient connected to face mask oxygen  Post-op Assessment: Report given to RN and Post -op Vital signs reviewed and stable  Post vital signs: Reviewed and stable  Last Vitals:  Vitals:   02/20/18 0759  BP: (!) 103/36  Pulse: 76  Resp: 15  Temp: 36.7 C  SpO2: 100%    Last Pain: There were no vitals filed for this visit.       Complications: No apparent anesthesia complications

## 2018-02-20 NOTE — Anesthesia Preprocedure Evaluation (Signed)
Anesthesia Evaluation  Patient identified by MRN, date of birth, ID band Patient awake    Reviewed: Allergy & Precautions, H&P , NPO status , Patient's Chart, lab work & pertinent test results  History of Anesthesia Complications (+) PONV and history of anesthetic complications  Airway Mallampati: II       Dental   Pulmonary shortness of breath,    breath sounds clear to auscultation       Cardiovascular Exercise Tolerance: Good + dysrhythmias  Rhythm:regular Rate:Normal     Neuro/Psych  Headaches,    GI/Hepatic GERD  ,  Endo/Other  diabetesHyperthyroidism   Renal/GU      Musculoskeletal   Abdominal   Peds  Hematology   Anesthesia Other Findings   Reproductive/Obstetrics (+) Pregnancy                             Anesthesia Physical  Anesthesia Plan  ASA: II  Anesthesia Plan: General   Post-op Pain Management:    Induction: Intravenous  PONV Risk Score and Plan: 2 and Ondansetron and Dexamethasone  Airway Management Planned: LMA  Additional Equipment:   Intra-op Plan:   Post-operative Plan: Extubation in OR  Informed Consent: I have reviewed the patients History and Physical, chart, labs and discussed the procedure including the risks, benefits and alternatives for the proposed anesthesia with the patient or authorized representative who has indicated his/her understanding and acceptance.   Dental advisory given  Plan Discussed with: CRNA  Anesthesia Plan Comments:         Anesthesia Quick Evaluation

## 2018-02-20 NOTE — Discharge Instructions (Signed)
PORT-A-CATH: POST OP INSTRUCTIONS  Always review your discharge instruction sheet given to you by the facility where your surgery was performed.   1. A prescription for pain medication may be given to you upon discharge. Take your pain medication as prescribed, if needed. If narcotic pain medicine is not needed, then you make take acetaminophen (Tylenol) or ibuprofen (Advil) as needed. Next Dose of Tylenol can be taken at 2:00pm 2. Take your usually prescribed medications unless otherwise directed. 3. If you need a refill on your pain medication, please contact our office. All narcotic pain medicine now requires a paper prescription.  Phoned in and fax refills are no longer allowed by law.  Prescriptions will not be filled after 5 pm or on weekends.  4. You should follow a light diet for the remainder of the day after your procedure. 5. Most patients will experience some mild swelling and/or bruising in the area of the incision. It may take several days to resolve. 6. It is common to experience some constipation if taking pain medication after surgery. Increasing fluid intake and taking a stool softener (such as Colace) will usually help or prevent this problem from occurring. A mild laxative (Milk of Magnesia or Miralax) should be taken according to package directions if there are no bowel movements after 48 hours.  7. Unless discharge instructions indicate otherwise, you may remove your bandages 48 hours after surgery, and you may shower at that time. You may have steri-strips (small white skin tapes) in place directly over the incision.  These strips should be left on the skin for 7-10 days.  If your surgeon used Dermabond (skin glue) on the incision, you may shower in 24 hours.  The glue will flake off over the next 2-3 weeks.  8. If your port is left accessed at the end of surgery (needle left in port), the dressing cannot get wet and should only by changed by a healthcare professional. When  the port is no longer accessed (when the needle has been removed), follow step 7.   9. ACTIVITIES:  Limit activity involving your arms for the next 72 hours. Do no strenuous exercise or activity for 1 week. You may drive when you are no longer taking prescription pain medication, you can comfortably wear a seatbelt, and you can maneuver your car. 10.You may need to see your doctor in the office for a follow-up appointment.  Please       check with your doctor.  11.When you receive a new Port-a-Cath, you will get a product guide and        ID card.  Please keep them in case you need them.  WHEN TO CALL YOUR DOCTOR [phone]): 1. Fever over 101.0 2. Chills 3. Continued bleeding from incision 4. Increased redness and tenderness at the site 5. Shortness of breath, difficulty breathing   The clinic staff is available to answer your questions during regular business hours. Please dont hesitate to call and ask to speak to one of the nurses or medical assistants for clinical concerns. If you have a medical emergency, go to the nearest emergency room or call 911.  A surgeon from [facility] is always on call at the hospital.     For further information, please visit [email or web address]   Post Anesthesia Home Care Instructions  Activity: Get plenty of rest for the remainder of the day. A responsible individual must stay with you for 24 hours following the procedure.  For the  next 24 hours, DO NOT: -Drive a car -Paediatric nurse -Drink alcoholic beverages -Take any medication unless instructed by your physician -Make any legal decisions or sign important papers.  Meals: Start with liquid foods such as gelatin or soup. Progress to regular foods as tolerated. Avoid greasy, spicy, heavy foods. If nausea and/or vomiting occur, drink only clear liquids until the nausea and/or vomiting subsides. Call your physician if vomiting continues.  Special  Instructions/Symptoms: Your throat may feel dry or sore from the anesthesia or the breathing tube placed in your throat during surgery. If this causes discomfort, gargle with warm salt water. The discomfort should disappear within 24 hours.  If you had a scopolamine patch placed behind your ear for the management of post- operative nausea and/or vomiting:  1. The medication in the patch is effective for 72 hours, after which it should be removed.  Wrap patch in a tissue and discard in the trash. Wash hands thoroughly with soap and water. 2. You may remove the patch earlier than 72 hours if you experience unpleasant side effects which may include dry mouth, dizziness or visual disturbances. 3. Avoid touching the patch. Wash your hands with soap and water after contact with the patch.     Next does of pain medication may be given at 6p

## 2018-02-20 NOTE — Interval H&P Note (Signed)
History and Physical Interval Note:  02/20/2018 8:16 AM  [name]  has presented today for surgery, with the diagnosis of breast cancer  The various methods of treatment have been discussed with the patient and family. After consideration of risks, benefits and other options for treatment, the patient has consented to  Procedure(s): INSERTION PORT-A-CATH WITH ULTRASOUND (N/A) as a surgical intervention .  The patient's history has been reviewed, patient examined, no change in status, stable for surgery.  I have reviewed the patient's chart and labs.  Questions were answered to the patient's satisfaction.     Rolm Bookbinder

## 2018-02-20 NOTE — Patient Instructions (Signed)
[facility] Discharge Instructions for Patients Receiving Chemotherapy  Today you received the following chemotherapy agents Adriamycin/ Cytoxan  To help prevent nausea and vomiting after your treatment, we encourage you to take your nausea medication as directed by your MD.   If you develop nausea and vomiting that is not controlled by your nausea medication, call the clinic.   BELOW ARE SYMPTOMS THAT SHOULD BE REPORTED IMMEDIATELY:  *FEVER GREATER THAN 100.5 F  *CHILLS WITH OR WITHOUT FEVER  NAUSEA AND VOMITING THAT IS NOT CONTROLLED WITH YOUR NAUSEA MEDICATION  *UNUSUAL SHORTNESS OF BREATH  *UNUSUAL BRUISING OR BLEEDING  TENDERNESS IN MOUTH AND THROAT WITH OR WITHOUT PRESENCE OF ULCERS  *URINARY PROBLEMS  *BOWEL PROBLEMS  UNUSUAL RASH Items with * indicate a potential emergency and should be followed up as soon as possible.  Feel free to call the clinic should you have any questions or concerns. The clinic phone number is [phone].  Please show the [facility] at check-in to the Emergency Department and triage nurse.   Doxorubicin injection What is this medicine? DOXORUBICIN (dox oh ROO bi sin) is a chemotherapy drug. It is used to treat many kinds of cancer like leukemia, lymphoma, neuroblastoma, sarcoma, and Wilms' tumor. It is also used to treat bladder cancer, breast cancer, lung cancer, ovarian cancer, stomach cancer, and thyroid cancer. This medicine may be used for other purposes; ask your health care provider or pharmacist if you have questions. COMMON BRAND NAME(S): Adriamycin, Adriamycin PFS, Adriamycin RDF, Rubex What should I tell my health care provider before I take this medicine? They need to know if you have any of these conditions: -heart disease -history of low blood counts caused by a medicine -liver disease -recent or ongoing radiation therapy -an unusual or allergic reaction to doxorubicin, other chemotherapy agents, other  medicines, foods, dyes, or preservatives -pregnant or trying to get pregnant -breast-feeding How should I use this medicine? This drug is given as an infusion into a vein. It is administered in a hospital or clinic by a specially trained health care professional. If you have pain, swelling, burning or any unusual feeling around the site of your injection, tell your health care professional right away. Talk to your pediatrician regarding the use of this medicine in children. Special care may be needed. Overdosage: If you think you have taken too much of this medicine contact a poison control center or emergency room at once. NOTE: This medicine is only for you. Do not share this medicine with others. What if I miss a dose? It is important not to miss your dose. Call your doctor or health care professional if you are unable to keep an appointment. What may interact with this medicine? This medicine may interact with the following medications: -6-mercaptopurine -paclitaxel -phenytoin -St. John's Wort -trastuzumab -verapamil This list may not describe all possible interactions. Give your health care provider a list of all the medicines, herbs, non-prescription drugs, or dietary supplements you use. Also tell them if you smoke, drink alcohol, or use illegal drugs. Some items may interact with your medicine. What should I watch for while using this medicine? This drug may make you feel generally unwell. This is not uncommon, as chemotherapy can affect healthy cells as well as cancer cells. Report any side effects. Continue your course of treatment even though you feel ill unless your doctor tells you to stop. There is a maximum amount of this medicine you should receive throughout your life. The  amount depends on the medical condition being treated and your overall health. Your doctor will watch how much of this medicine you receive in your lifetime. Tell your doctor if you have taken this medicine  before. You may need blood work done while you are taking this medicine. Your urine may turn red for a few days after your dose. This is not blood. If your urine is dark or brown, call your doctor. In some cases, you may be given additional medicines to help with side effects. Follow all directions for their use. Call your doctor or health care professional for advice if you get a fever, chills or sore throat, or other symptoms of a cold or flu. Do not treat yourself. This drug decreases your body's ability to fight infections. Try to avoid being around people who are sick. This medicine may increase your risk to bruise or bleed. Call your doctor or health care professional if you notice any unusual bleeding. Talk to your doctor about your risk of cancer. You may be more at risk for certain types of cancers if you take this medicine. Do not become pregnant while taking this medicine or for 6 months after stopping it. Women should inform their doctor if they wish to become pregnant or think they might be pregnant. Men should not father a child while taking this medicine and for 6 months after stopping it. There is a potential for serious side effects to an unborn child. Talk to your health care professional or pharmacist for more information. Do not breast-feed an infant while taking this medicine. This medicine has caused ovarian failure in some women and reduced sperm counts in some men This medicine may interfere with the ability to have a child. Talk with your doctor or health care professional if you are concerned about your fertility. What side effects may I notice from receiving this medicine? Side effects that you should report to your doctor or health care professional as soon as possible: -allergic reactions like skin rash, itching or hives, swelling of the face, lips, or tongue -breathing problems -chest pain -fast or irregular heartbeat -low blood counts - this medicine may decrease the  number of white blood cells, red blood cells and platelets. You may be at increased risk for infections and bleeding. -pain, redness, or irritation at site where injected -signs of infection - fever or chills, cough, sore throat, pain or difficulty passing urine -signs of decreased platelets or bleeding - bruising, pinpoint red spots on the skin, black, tarry stools, blood in the urine -swelling of the ankles, feet, hands -tiredness -weakness Side effects that usually do not require medical attention (report to your doctor or health care professional if they continue or are bothersome): -diarrhea -hair loss -mouth sores -nail discoloration or damage -nausea -red colored urine -vomiting This list may not describe all possible side effects. Call your doctor for medical advice about side effects. You may report side effects to FDA at 1-800-FDA-1088. Where should I keep my medicine? This drug is given in a hospital or clinic and will not be stored at home. NOTE: This sheet is a summary. It may not cover all possible information. If you have questions about this medicine, talk to your doctor, pharmacist, or health care provider.  2018 Elsevier/Gold Standard (2016-02-07 11:28:51)   Cyclophosphamide injection What is this medicine? CYCLOPHOSPHAMIDE (sye kloe FOSS fa mide) is a chemotherapy drug. It slows the growth of cancer cells. This medicine is used to treat many types  of cancer like lymphoma, myeloma, leukemia, breast cancer, and ovarian cancer, to name a few. This medicine may be used for other purposes; ask your health care provider or pharmacist if you have questions. COMMON BRAND NAME(S): Cytoxan, Neosar What should I tell my health care provider before I take this medicine? They need to know if you have any of these conditions: -blood disorders -history of other chemotherapy -infection -kidney disease -liver disease -recent or ongoing radiation therapy -tumors in the bone  marrow -an unusual or allergic reaction to cyclophosphamide, other chemotherapy, other medicines, foods, dyes, or preservatives -pregnant or trying to get pregnant -breast-feeding How should I use this medicine? This drug is usually given as an injection into a vein or muscle or by infusion into a vein. It is administered in a hospital or clinic by a specially trained health care professional. Talk to your pediatrician regarding the use of this medicine in children. Special care may be needed. Overdosage: If you think you have taken too much of this medicine contact a poison control center or emergency room at once. NOTE: This medicine is only for you. Do not share this medicine with others. What if I miss a dose? It is important not to miss your dose. Call your doctor or health care professional if you are unable to keep an appointment. What may interact with this medicine? This medicine may interact with the following medications: -amiodarone -amphotericin B -azathioprine -certain antiviral medicines for HIV or AIDS such as protease inhibitors (e.g., indinavir, ritonavir) and zidovudine -certain blood pressure medications such as benazepril, captopril, enalapril, fosinopril, lisinopril, moexipril, monopril, perindopril, quinapril, ramipril, trandolapril -certain cancer medications such as anthracyclines (e.g., daunorubicin, doxorubicin), busulfan, cytarabine, paclitaxel, pentostatin, tamoxifen, trastuzumab -certain diuretics such as chlorothiazide, chlorthalidone, hydrochlorothiazide, indapamide, metolazone -certain medicines that treat or prevent blood clots like warfarin -certain muscle relaxants such as succinylcholine -cyclosporine -etanercept -indomethacin -medicines to increase blood counts like filgrastim, pegfilgrastim, sargramostim -medicines used as general anesthesia -metronidazole -natalizumab This list may not describe all possible interactions. Give your health care  provider a list of all the medicines, herbs, non-prescription drugs, or dietary supplements you use. Also tell them if you smoke, drink alcohol, or use illegal drugs. Some items may interact with your medicine. What should I watch for while using this medicine? Visit your doctor for checks on your progress. This drug may make you feel generally unwell. This is not uncommon, as chemotherapy can affect healthy cells as well as cancer cells. Report any side effects. Continue your course of treatment even though you feel ill unless your doctor tells you to stop. Drink water or other fluids as directed. Urinate often, even at night. In some cases, you may be given additional medicines to help with side effects. Follow all directions for their use. Call your doctor or health care professional for advice if you get a fever, chills or sore throat, or other symptoms of a cold or flu. Do not treat yourself. This drug decreases your body's ability to fight infections. Try to avoid being around people who are sick. This medicine may increase your risk to bruise or bleed. Call your doctor or health care professional if you notice any unusual bleeding. Be careful brushing and flossing your teeth or using a toothpick because you may get an infection or bleed more easily. If you have any dental work done, tell your dentist you are receiving this medicine. You may get drowsy or dizzy. Do not drive, use machinery, or do anything  that needs mental alertness until you know how this medicine affects you. Do not become pregnant while taking this medicine or for 1 year after stopping it. Women should inform their doctor if they wish to become pregnant or think they might be pregnant. Men should not father a child while taking this medicine and for 4 months after stopping it. There is a potential for serious side effects to an unborn child. Talk to your health care professional or pharmacist for more information. Do not  breast-feed an infant while taking this medicine. This medicine may interfere with the ability to have a child. This medicine has caused ovarian failure in some women. This medicine has caused reduced sperm counts in some men. You should talk with your doctor or health care professional if you are concerned about your fertility. If you are going to have surgery, tell your doctor or health care professional that you have taken this medicine. What side effects may I notice from receiving this medicine? Side effects that you should report to your doctor or health care professional as soon as possible: -allergic reactions like skin rash, itching or hives, swelling of the face, lips, or tongue -low blood counts - this medicine may decrease the number of white blood cells, red blood cells and platelets. You may be at increased risk for infections and bleeding. -signs of infection - fever or chills, cough, sore throat, pain or difficulty passing urine -signs of decreased platelets or bleeding - bruising, pinpoint red spots on the skin, black, tarry stools, blood in the urine -signs of decreased red blood cells - unusually weak or tired, fainting spells, lightheadedness -breathing problems -dark urine -dizziness -palpitations -swelling of the ankles, feet, hands -trouble passing urine or change in the amount of urine -weight gain -yellowing of the eyes or skin Side effects that usually do not require medical attention (report to your doctor or health care professional if they continue or are bothersome): -changes in nail or skin color -hair loss -missed menstrual periods -mouth sores -nausea, vomiting This list may not describe all possible side effects. Call your doctor for medical advice about side effects. You may report side effects to FDA at 1-800-FDA-1088. Where should I keep my medicine? This drug is given in a hospital or clinic and will not be stored at home. NOTE: This sheet is a  summary. It may not cover all possible information. If you have questions about this medicine, talk to your doctor, pharmacist, or health care provider.  2018 Elsevier/Gold Standard (2012-10-25 16:22:58)

## 2018-02-20 NOTE — Anesthesia Postprocedure Evaluation (Signed)
Anesthesia Post Note  Patient: [name]  Procedure(s) Performed: INSERTION PORT-A-CATH WITH ULTRASOUND (N/A Chest)     Patient location during evaluation: PACU Anesthesia Type: General Level of consciousness: sedated and patient cooperative Pain management: pain level controlled Vital Signs Assessment: post-procedure vital signs reviewed and stable Respiratory status: spontaneous breathing Cardiovascular status: stable Anesthetic complications: no    Last Vitals:  Vitals:   02/20/18 1015 02/20/18 1030  BP: (!) 113/54 107/65  Pulse: 94 93  Resp: 20 19  Temp:    SpO2: 100% 100%    Last Pain:  Vitals:   02/20/18 1030  PainSc: 3                  Miela Desjardin [facility]

## 2018-02-20 NOTE — Op Note (Signed)
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  We discussed port placement.   Procedure: After informed consent was obtained the patient was taken to the operating room. She was given antibiotics. Sequential compression devices were on her legs. She was then placed under general anesthesia with an LMA. Then she was prepped and draped in the standard sterile surgical fashion. Surgical timeout was then performed.  I used the ultrasound to identify the right internal jugular vein. I then accessed the vein using the ultrasound.This aspirated blood. I then placed the wire. This was confirmed by fluoroscopy and ultrasound to be in the correct position.I tunneled the line between the 2 sites.  I then dilated the tract and placed the dilator assembly with the sheath. This was done under fluoroscopy. I then removed the sheath and dilator. The wire was also removed. The line was then pulled back to be in the venacava. I hooked this up to the port. I sutured this into place with 2-0 Prolene in 2 places. This aspirated blood and flushed easily.This was confirmed with a final fluoroscopy. I then closed this with 2-0 Vicryl and 4-0 Monocryl.This withdrew blood and I placed heparin in it. I accessed the port with the needle and left this in place for chemotherapy later today. Dermabond was placed on both the incisions.A dressing was placed. She tolerated this well and was transferred to the recovery room in stable condition

## 2018-02-20 NOTE — Progress Notes (Signed)
1650 - Patient c/o nasal burning. Cyclophosphamide infusion rate decreased.

## 2018-02-20 NOTE — H&P (Signed)
32 yof referred by Dr Quintin Alto for new right breast cancer. she palpated a mass about five weeks ago. she has history in a paternal aunt of brca 1 e908 mutation. no nipple discharge. she has three girls. 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 she had a mm that shows c density breasts. there is a mass in ruiq. US shows a 1.5x1.3x1.2 cm mass with adjacent abnormality measuring 2.2x2.2x1.5 cm. there is no axillary adenopathy. 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 she is here with her husband to discuss options  Past Surgical History Alean Rinne, [place]; 02/08/2018 10:33 AM) Breast Biopsy  Right. Cesarean Section - Multiple  Gallbladder Surgery - Laparoscopic   Diagnostic Studies History Alean Rinne, [place]; 02/08/2018 10:33 AM) Colonoscopy  never  Allergies Alean Rinne, RMA; 02/08/2018 10:35 AM) Local Anesthetics ([place] - Lidocaine)  Allergies Reconciled   Medication History Alean Rinne, RMA; 02/08/2018 10:35 AM) Gabapentin (300MG Capsule, Oral) Active. Medications Reconciled  Social History Alean Rinne, [place]; 02/08/2018 10:33 AM) Alcohol use  Remotely quit alcohol use. Caffeine use  Carbonated beverages. No drug use  Tobacco use  Current some day smoker.  Family History Alean Rinne, [place]; 02/08/2018 10:33 AM) Alcohol Abuse  Father. Breast Cancer  Family Members In General. Thyroid problems  Brother.  Pregnancy / Birth History Alean Rinne, [place]; 02/08/2018 10:33 AM) Age at menarche  75 years. Contraceptive History  Oral contraceptives. Gravida  2 Irregular periods  Maternal age  39-20 Para  3  Other Problems Alean Rinne, [place]; 02/08/2018 10:33 AM) Back Pain  Breast Cancer  Kidney Stone  Lump In Breast  Migraine Headache  Umbilical Hernia Repair   Review of Systems Alean Rinne RMA; 02/08/2018 10:33 AM) General Present- Fatigue. Not Present- Appetite Loss, Chills, Fever, Night  Sweats, Weight Gain and Weight Loss. Skin Present- Rash. Not Present- Change in Wart/Mole, Dryness, Hives, Jaundice, New Lesions, Non-Healing Wounds and Ulcer. HEENT Present- Wears glasses/contact lenses. Not Present- Earache, Hearing Loss, Hoarseness, Nose Bleed, Oral Ulcers, Ringing in the Ears, Seasonal Allergies, Sinus Pain, Sore Throat, Visual Disturbances and Yellow Eyes. Breast Present- Breast Mass, Breast Pain and Skin Changes. Not Present- Nipple Discharge. Cardiovascular Present- Leg Cramps. Not Present- Chest Pain, Difficulty Breathing Lying Down, Palpitations, Rapid Heart Rate, Shortness of Breath and Swelling of Extremities. Gastrointestinal Not Present- Abdominal Pain, Bloating, Bloody Stool, Change in Bowel Habits, Chronic diarrhea, Constipation, Difficulty Swallowing, Excessive gas, Gets full quickly at meals, Hemorrhoids, Indigestion, Nausea, Rectal Pain and Vomiting. Female Genitourinary Not Present- Frequency, Nocturia, Painful Urination, Pelvic Pain and Urgency. Musculoskeletal Present- Back Pain, Muscle Pain and Muscle Weakness. Not Present- Joint Pain, Joint Stiffness and Swelling of Extremities. Neurological Present- Decreased Memory, Numbness and Tingling. Not Present- Fainting, Headaches, Seizures, Tremor, Trouble walking and Weakness. Psychiatric Present- Fearful. Not Present- Anxiety, Bipolar, Change in Sleep Pattern, Depression and Frequent crying. Endocrine Not Present- Cold Intolerance, Excessive Hunger, Hair Changes, Heat Intolerance, Hot flashes and New Diabetes. Hematology Not Present- Blood Thinners, Easy Bruising, Excessive bleeding, Gland problems, HIV and Persistent Infections.  Vitals Alean Rinne RMA; 02/08/2018 10:34 AM) 02/08/2018 10:34 AM Weight: 127.6 lb Height: 60in Body Surface Area: 1.54 m Body Mass Index: 24.92 kg/m  Temp.: 98.22F  Pulse: 93 (Regular)  BP: 110/72 (Sitting, Left Arm, Standard) Physical Exam Rolm Bookbinder MD;  02/08/2018 3:37 PM) General Mental Status-Alert. Orientation-Oriented X3. Head and Neck Trachea-midline. Thyroid Gland Characteristics - normal size and consistency. Eye Sclera/Conjunctiva -  Bilateral-No scleral icterus. Chest and Lung Exam Chest and lung exam reveals -quiet, even and easy respiratory effort with no use of accessory muscles and on auscultation, normal breath sounds, no adventitious sounds and normal vocal resonance. Breast Nipples-No Discharge. Note: right breast upper inner mass about 2-3 cm mobile nontender Cardiovascular Cardiovascular examination reveals -normal heart sounds, regular rate and rhythm with no murmurs. Abdomen Note: soft nt Lymphatic Head & Neck General Head & Neck Lymphatics: Bilateral - Description - Normal. Axillary General Axillary Region: Bilateral - Description - Normal. Note: no Hi-Nella adenopathy   Assessment & Plan Rolm Bookbinder MD; 02/08/2018 3:42 PM) BREAST CANCER OF UPPER-INNER QUADRANT OF RIGHT FEMALE BREAST (C50.211) Story: port placement, genetics, mri bilateral breasts oncology asap we discussed pathophysiology of breast cancer and all treatment options. she understands what triple negative means as she watched her aunt go through this. I recommended asap genetics and mri due to possible brca positivity, dense breasts and age. I will also have her see oncology. discussed port placement as I think primary chemotherapy is best strategy we discussed sn biopsy as well as breast surgery. we discussed indication for mastectomies and she would be candidate for nsm. will await mri and oncology.

## 2018-02-20 NOTE — Anesthesia Procedure Notes (Signed)
Procedure Name: LMA Insertion Date/Time: 02/20/2018 8:33 AM Performed by: Marrianne Mood, CRNA Pre-anesthesia Checklist: Patient identified, Emergency Drugs available, Suction available, Patient being monitored and Timeout performed Patient Re-evaluated:Patient Re-evaluated prior to induction Oxygen Delivery Method: Circle system utilized Preoxygenation: Pre-oxygenation with 100% oxygen Induction Type: IV induction Ventilation: Mask ventilation without difficulty LMA: LMA inserted LMA Size: 4.0 Number of attempts: 1 Airway Equipment and Method: Bite block Placement Confirmation: positive ETCO2 Tube secured with: Tape Dental Injury: Teeth and Oropharynx as per pre-operative assessment

## 2018-02-20 NOTE — Progress Notes (Signed)
1240- Pt arrived from [facility] post port placement. Pt reports that she feels "dizzy and feels like I am going to pass out." Pt assisted to chair and immediately felt extremely nauseated. Offered a cool towel, and laid patient in a supine position. Pt states that she needed to lay down right away. Pt accompanied by her husband and is somewhat has an unsteady gait.   After pt came back from the restroom, pt felt very faint and pale. She reports that she is feeling like she is going to pass out again. Changed pt port dressing from [facility]. Notified Dr.Gudena and request to see pt today. Started infusing NSS bolus and obtained order for zofran for nausea.   Per Dr.Gudena, pt may need to wait for treatment until tomorrow to make sure pt is feeling better prior to starting chemo. Pt will finish remaining NS Bolus IV and re assess symptoms and VS.   1315- After infusing 500cc, reassessed VS and BP now lower 99/58. Pt states that she is mildly dizzy, but still nauseated (post 8mg  IV zofran). Pt provided with cheese and crackers to see if she would feel any better, since she had been NPO for most of the day. Will recheck BP and symptoms in 30 minutes.   1400- Pt VSS and reports feeling much improved from her symptoms. She wants to have her treatment today if okay with MD. Notified Dr.Gudena's desk nurse, as well as pt nurse navigator. Will release medication under treatment and will verify Zofran 8mg  IV that was given with pharmacy.  Per pharmacy, okay to give Aloxi even if pt recently received 8mg  zofran IV. Pt notified of possible s/s of zofran side effects (constipation/headache) and is aware.  1600- Per pharmacy, Neulasta onpro still pending approval at this time. Pt will need to come back on Friday for injection. Pt notified and prefers to have injection done at [facility], if at all possible, due to distance at this cancer center and pt schedule conflict.  Notified Dr.Gudena, per Tiffany,RN.

## 2018-02-20 NOTE — Progress Notes (Addendum)
 GENETIC TEST RESULTS   Patient Name: [name] Patient Age: 32 y.o. Encounter Date: 02/20/2018  Referring Provider: Donnice Bury, MD    Ms. Terhune was seen in the infusion room on February 20, 2018 due to a personal and family history of cancer and concern regarding a hereditary predisposition to cancer in the family. Please refer to the prior Genetics clinic note for more information regarding Ms. Stacks's medical and family histories and our assessment at the time.   FAMILY HISTORY:  We obtained a detailed, 4-generation family history.  Significant diagnoses are listed below: Family History  Problem Relation Age of Onset   Cirrhosis Father    Breast cancer Paternal Aunt 54       BRCA1 p.E908 pos   Heart disease Maternal Grandfather        Heart attack   Breast cancer Paternal Aunt        dx in her 30s   Colon cancer Maternal Aunt        dx in her 30s   Lupus Sister        pat 1/2 sister   Other Brother 60       pat 1/2 brother died in MVA   Lung cancer Maternal Uncle        smoker   Other Paternal Aunt        died in a MVA   Other Paternal Aunt        died from poisioning    The patient has three daughters, two are fraternal twins, who are all cancer free.  She has one full brother, a maternal half sister, and a paternal half brother and sister who are all cancer free.  Her father is deceased and her mother is living.   The patient's father died from liver cirrhosis.  He had six sisters.  One sister died in a car accident, one died from poisoning, and one from breast cancer in her 65's.  Another sister developed breast cancer at 62 and was identified to have a BRCA1 mutation.  Both paternal grandparents are deceased.  There is a known distant cousin who has breast cancer in her 22's but she is not sure if the cousin is on her grandmother or grandfather's side of the family.   The patient's mother has two brothers and two sisters.  One brother was a smoker and  died from lung cancer.  One sister developed colon cancer in her 72's and died.  The maternal grandmother is living, and the grandfather died of stomach cancer in his 7's.   Patient's maternal ancestors are of Caucasian descent, and paternal ancestors are of Caucasian descent. There is no reported Ashkenazi Jewish ancestry. There is no known consanguinity.   GENETIC TESTING:  At the time of Ms. Crisman's visit, we recommended she pursue genetic testing of the common hereditary cancer test. 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 There were no deleterious mutations in APC, ATM, AXIN2, BARD1, BMPR1A, BRCA1, BRCA2, BRIP1, CDH1, CDK4, CDKN2A, CHEK2, DICER1, EPCAM, GREM1, KIT, MEN1, MLH1, MSH2, MSH6, MUTYH, NBN, NF1, PALB2, PDGFRA, PMS2, POLD1, POLE, PTEN, RAD50, RAD51C, RAD51D, SDHA, SDHB, SDHC, SDHD, SMAD4, SMARCA4. STK11, TP53, TSC1, TSC2, and VHL.   SABRA  Genetic testing did detect two Variants of Unknown Significance -one in the MLH1 gene called c.979C>G and one in the RAD51C gene called c.506T>C.  At this time, it is unknown if these variants are associated with increased cancer risk or if they are a normal finding, but most variants such as these get reclassified to being inconsequential. They should not be used to make medical management decisions. With time, we suspect the lab will determine the significance of these variants, if any. If we do learn more about them, we will try to contact Ms. Helmers to discuss it further. However, it is important to stay in touch with us  periodically and keep the address and phone number up to date.  UPDATE:  MLH1 c.979C>G VUS has been amended to Benign.    MEDICAL MANAGEMENT: Women who have a BRCA mutation have an increased risk for both breast and ovarian cancer.    As discussed with Ms.  Kanzler, to reduce the risk for breast cancer, prophylactic bilateral mastectomy is the most effective option for risk reduction. However, for women who choose to keep their breasts intensified screening is equally safe.  We recommend yearly mammograms, yearly breast MRI, twice-yearly clinical breast exams, and monthly self-breast exams. Ms. Cunning  Has spoken with Dr. Donnice Bury about bilateral mastectomies.  We refer her to discuss this with him further about her upcoming surgery.  To reduce the risk for ovarian cancer, we recommend Ms. Bracy have a prophylactic bilateral salpingo-oophorectomy when childbearing is completed, if planned. We discussed that screening with CA-125 blood tests and transvaginal ultrasounds can be done twice per year. However, these tests have not been shown to detect ovarian cancer at an early stage.  Ms. Antilla will follow up with Dr. Gorge in regards to her ovarian cancer risk.    RISK REDUCTION: There are several things that can be offered to individuals who are carriers for BRCA mutations that will reduce the risk for getting cancer.    The use of oral contraceptives can lower the risk for ovarian cancer, and, per case control studies, does not significantly increase the risk for breast cancer in BRCA patients.  Case control studies have shown that oral contraceptives can lower the risk for ovarian cancer in women with BRCA mutations. Additionally, a more recent meta-analysis, including one cohort (n=3,181) and one case control study (1,096 cases and 2,878 controls) also showed an inverse correlation between ovarian cancer and ever having used oral contraceptives (OR, 0.58; 95% CI = 0.46-0.73).  Studies on oral contraceptives and breast cancer have been conflicting, with some studies suggesting that there is not an increased risk for breast cancer in BRCA mutation carriers, while others suggest that there could be a risk.  That said, two meta-analysis studies have shown  that there is not an increased risk for breast cancer with oral contraceptive use in BRCA1 and BRCA2 carriers.    In individuals who have a prophylactic bilateral salpingo-oophorectomy (BSO), the risk for breast cancer is reduced by up to 50%.  It has been reported that short term hormone replacement therapy in women undergoing prophylactic BSO does not negate the reduction of breast cancer risk associated with surgery (1.2018 NCCN guidelines).  FAMILY MEMBERS: It is important that all of Ms. Lefferts's relatives (both men and women) know of the presence of this gene mutation. Site-specific genetic testing can sort out who in the family is at risk and who is not.   Ms. Rosenwald children are have a 50% chance to have inherited this mutation. However, they are relatively young and this will not be of any consequence to them for several years. We do  not test children because there is no risk to them until they are adults. We recommend they have genetic counseling and testing by the time they are in their early 20's.    Ms. Mongillo siblings have a 50% chance to have inherited this mutation. We recommend they have genetic testing for this same mutation, as identifying the presence of this mutation would allow them to also take advantage of risk-reducing measures.   SUPPORT AND RESOURCES: If Ms. Neuenfeldt is interested in BRCA-specific information and support, there are two groups, Facing Our Risk (www.facingourrisk.com) and Bright Pink (www.brightpink.org) which some people have found useful. They provide opportunities to speak with other individuals from high-risk families. To locate genetic counselors in other cities, visit the website of the [employer] ([employer].nl) and Financial controller for a Veterinary surgeon by zip code.  We encouraged Ms. Crisp to remain in contact with us  on an annual basis so we can update her personal and family histories, and let her know of advances in cancer genetics that  may benefit the family. Our contact number was provided. Ms. Liberati questions were answered to her satisfaction today, and she knows she is welcome to call anytime with additional questions.   Jakiah Goree P. Perri, MS, [facility] Certified Genetic Counselor [email or web address] phone: [phone]

## 2018-02-20 NOTE — Telephone Encounter (Signed)
Spoke to patient regarding upcoming march appointment updates per 2/25 sch message.

## 2018-02-21 ENCOUNTER — Telehealth: Payer: Self-pay

## 2018-02-21 ENCOUNTER — Other Ambulatory Visit (HOSPITAL_COMMUNITY): Payer: Self-pay | Admitting: Pharmacist

## 2018-02-21 ENCOUNTER — Other Ambulatory Visit: Payer: Self-pay

## 2018-02-21 ENCOUNTER — Encounter: Payer: Self-pay | Admitting: Hematology and Oncology

## 2018-02-21 ENCOUNTER — Encounter (HOSPITAL_BASED_OUTPATIENT_CLINIC_OR_DEPARTMENT_OTHER): Payer: Self-pay | Admitting: General Surgery

## 2018-02-21 MED ORDER — PROMETHAZINE HCL 25 MG PO TABS: 12.5000 mg | ORAL_TABLET | Freq: Four times a day (QID) | ORAL | 0 refills | Status: DC | PRN

## 2018-02-21 NOTE — Progress Notes (Signed)
Called pt to introduce myself as her Financial Resource Specialist and to discuss copay assistance.  Left msg requesting she return my call at her earliest convenience.  °

## 2018-02-21 NOTE — Telephone Encounter (Signed)
Dr. Lindi Adie was able to get [name]'s scheduled for her injection at [facility], called pt to confirm this with her. While on the phone patient stated she has taken her decadron and compazine as prescribed and she is still very nauseous. Per Dr. Lindi Adie we are adding phenergan to her meds and I have sent a script to her pharmacy. She is aware. She is to call with any further concerns.  Cyndia Bent RN

## 2018-02-21 NOTE — Telephone Encounter (Signed)
See note

## 2018-02-21 NOTE — Telephone Encounter (Signed)
Called patient for chemo follow up call as well as to get her scheduled for her Neulasta injection tomorrow. Advised patient to please return call. I informed her that I tried to get her scheduled at [facility] as well as our Nurse, mental health, Renee Pain, and we could not get it approved. She will need to come to our office for injection as of now.  Cyndia Bent RN

## 2018-02-22 ENCOUNTER — Other Ambulatory Visit: Payer: Self-pay

## 2018-02-22 ENCOUNTER — Encounter (HOSPITAL_COMMUNITY): Payer: Self-pay

## 2018-02-22 ENCOUNTER — Inpatient Hospital Stay (HOSPITAL_COMMUNITY): Payer: BLUE CROSS/BLUE SHIELD | Attending: Internal Medicine

## 2018-02-22 ENCOUNTER — Ambulatory Visit (HOSPITAL_COMMUNITY): Payer: BLUE CROSS/BLUE SHIELD

## 2018-02-22 VITALS — BP 107/59 | HR 75 | Temp 98.6°F | Resp 18

## 2018-02-22 DIAGNOSIS — D701 Agranulocytosis secondary to cancer chemotherapy: Secondary | ICD-10-CM | POA: Diagnosis not present

## 2018-02-22 DIAGNOSIS — C50211 Malignant neoplasm of upper-inner quadrant of right female breast: Secondary | ICD-10-CM | POA: Insufficient documentation

## 2018-02-22 DIAGNOSIS — Z171 Estrogen receptor negative status [ER-]: Secondary | ICD-10-CM

## 2018-02-22 MED ORDER — PEGFILGRASTIM-CBQV 6 MG/0.6ML ~~LOC~~ SOSY
6.0000 mg | PREFILLED_SYRINGE | Freq: Once | SUBCUTANEOUS | Status: AC
  Administered 2018-02-22: 6 mg via SUBCUTANEOUS
  Filled 2018-02-22: qty 0.6

## 2018-02-22 NOTE — Progress Notes (Signed)
[name] presents today for injection per MD orders. Udenyca 6 mg  administered SQ in left Abdomen. Administration without incident. Patient tolerated well.  Treatment given per orders. Patient tolerated it well without problems. Vitals stable and discharged home from clinic ambulatory. Follow up as scheduled.

## 2018-02-22 NOTE — Patient Instructions (Signed)
[facility] at [facility] Discharge Instructions  Udenyca 6 mg given today. Follow up as scheduled.   Thank you for choosing [facility] at [facility] to provide your oncology and hematology care.  To afford each patient quality time with our provider, please arrive at least 15 minutes before your scheduled appointment time.   If you have a lab appointment with the [facility] please come in thru the  Main Entrance and check in at the main information desk  You need to re-schedule your appointment should you arrive 10 or more minutes late.  We strive to give you quality time with our providers, and arriving late affects you and other patients whose appointments are after yours.  Also, if you no show three or more times for appointments you may be dismissed from the clinic at the providers discretion.     Again, thank you for choosing [facility].  Our hope is that these requests will decrease the amount of time that you wait before being seen by our physicians.       _____________________________________________________________  Should you have questions after your visit to [facility], please contact our office at [phone] between the hours of 8:30 a.m. and 4:30 p.m.  Voicemails left after 4:30 p.m. will not be returned until the following business day.  For prescription refill requests, have your pharmacy contact our office.       Resources For Cancer Patients and their Caregivers ? American Cancer Society: Can assist with transportation, wigs, general needs, runs Look Good Feel Better.        984-859-8580 ? Cancer Care: Provides financial assistance, online support groups, medication/co-pay assistance.  1-800-813-HOPE (262)707-3324) ? [place] Assists [place] cancer patients and their families through emotional , educational and financial support.  (908)118-2652 ? [place]  DSS Where to apply for food stamps, Medicaid and utility assistance. (615)561-9252 ? [facility]: Transportation to medical appointments. (531) 374-4093 ? Social Security Administration: May apply for disability if have a Stage IV cancer. (272)760-8289 (323)270-2772 ? [employer], Disability and Transit Services: Assists with nutrition, care and transit needs. [facility] Support Programs:   > Cancer Support Group  2nd Tuesday of the month 1pm-2pm, Journey Room   > Creative Journey  3rd Tuesday of the month 1130am-1pm, Journey Room

## 2018-02-27 ENCOUNTER — Inpatient Hospital Stay: Payer: BLUE CROSS/BLUE SHIELD | Attending: Genetic Counselor

## 2018-02-27 ENCOUNTER — Encounter: Payer: Self-pay | Admitting: Hematology and Oncology

## 2018-02-27 ENCOUNTER — Inpatient Hospital Stay (HOSPITAL_BASED_OUTPATIENT_CLINIC_OR_DEPARTMENT_OTHER): Payer: BLUE CROSS/BLUE SHIELD | Admitting: Hematology and Oncology

## 2018-02-27 DIAGNOSIS — Z1501 Genetic susceptibility to malignant neoplasm of breast: Secondary | ICD-10-CM | POA: Insufficient documentation

## 2018-02-27 DIAGNOSIS — K2970 Gastritis, unspecified, without bleeding: Secondary | ICD-10-CM | POA: Insufficient documentation

## 2018-02-27 DIAGNOSIS — Z5111 Encounter for antineoplastic chemotherapy: Secondary | ICD-10-CM | POA: Diagnosis not present

## 2018-02-27 DIAGNOSIS — Z171 Estrogen receptor negative status [ER-]: Secondary | ICD-10-CM | POA: Insufficient documentation

## 2018-02-27 DIAGNOSIS — C50211 Malignant neoplasm of upper-inner quadrant of right female breast: Secondary | ICD-10-CM | POA: Insufficient documentation

## 2018-02-27 DIAGNOSIS — K5900 Constipation, unspecified: Secondary | ICD-10-CM | POA: Insufficient documentation

## 2018-02-27 DIAGNOSIS — D701 Agranulocytosis secondary to cancer chemotherapy: Secondary | ICD-10-CM

## 2018-02-27 DIAGNOSIS — R110 Nausea: Secondary | ICD-10-CM

## 2018-02-27 LAB — CMP (CANCER CENTER ONLY)
ALK PHOS: 67 U/L (ref 40–150)
ALT: 21 U/L (ref 0–55)
AST: 12 U/L (ref 5–34)
Albumin: 4.2 g/dL (ref 3.5–5.0)
Anion gap: 8 (ref 3–11)
BILIRUBIN TOTAL: 0.4 mg/dL (ref 0.2–1.2)
BUN: 12 mg/dL (ref 7–26)
CO2: 30 mmol/L — AB (ref 22–29)
Calcium: 10.7 mg/dL — ABNORMAL HIGH (ref 8.4–10.4)
Chloride: 99 mmol/L (ref 98–109)
Creatinine: 0.71 mg/dL (ref 0.60–1.10)
GFR, Estimated: >60 mL/min (ref >60)
Glucose, Bld: 92 mg/dL (ref 70–140)
Potassium: 4.3 mmol/L (ref 3.5–5.1)
SODIUM: 137 mmol/L (ref 136–145)
Total Protein: 7.5 g/dL (ref 6.4–8.3)

## 2018-02-27 LAB — CBC WITH DIFFERENTIAL (CANCER CENTER ONLY)
Basophils Absolute: 0 10*3/uL (ref 0.0–0.1)
Basophils Relative: 1 %
EOS ABS: 0.1 10*3/uL (ref 0.0–0.5)
Eosinophils Relative: 7 %
HCT: 39.7 % (ref 34.8–46.6)
HEMOGLOBIN: 13.3 g/dL (ref 11.6–15.9)
Lymphocytes Relative: 42 %
Lymphs Abs: 0.8 10*3/uL — ABNORMAL LOW (ref 0.9–3.3)
MCH: 29.7 pg (ref 25.1–34.0)
MCHC: 33.5 g/dL (ref 31.5–36.0)
MCV: 88.6 fL (ref 79.5–101.0)
MONOS PCT: 8 %
Monocytes Absolute: 0.1 10*3/uL (ref 0.1–0.9)
NEUTROS PCT: 42 %
Neutro Abs: 0.8 10*3/uL — ABNORMAL LOW (ref 1.5–6.5)
Platelet Count: 167 10*3/uL (ref 145–400)
RBC: 4.48 MIL/uL (ref 3.70–5.45)
RDW: 12.1 % (ref 11.2–14.5)
WBC Count: 1.8 10*3/uL — ABNORMAL LOW (ref 3.9–10.3)

## 2018-02-27 MED ORDER — PANTOPRAZOLE SODIUM 40 MG PO TBEC: 40.0000 mg | DELAYED_RELEASE_TABLET | Freq: Every day | ORAL | 0 refills | Status: DC

## 2018-02-27 NOTE — Assessment & Plan Note (Signed)
02/01/2018:Screening detected right breast mass in the upper inner quadrant measuring 1.5 cm, with additional enhancement it measured 2.2 cm.  No axillary lymph nodes.  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  Genetic counseling and testing 2. neoadjuvant chemotherapy with dose dense Adriamycin and Cytoxan every 2 weeks x4 followed by Taxol weekly x12  accompanied by carboplatin every 3 weeks x4 3. Followed by surgical options dependent on whether she has genetic mutation. 4.  Adjuvant radiation if she undergoes breast conserving surgery CT CAP and bone scan: Negative for metastases ---------------------------------------------------------------------------------------------------------------------------------  Current treatment: Cycle 1 day 8 of dose dense Adriamycin and Cytoxan Chemo toxicities:  Return to clinic 1 week for cycle 2

## 2018-02-27 NOTE — Progress Notes (Signed)
Met w/ pt to discuss copay assistance. Pt gave me consent to apply in her behalf so I enrolledherin the [facility] [place].  She is approvedfor 12 months from3/6/19for $4,000.  $2,500 is her standard award and $1,500 is accessible on a [employer] basis as long as funding remains available in the [place].  I also enrolled her in the [employer] program for Udenyca for a year from 02/27/18 for $15,000.  I informed her of the [employer], went over what it covers and gave her an expense sheet.  She would like to apply so her husband will send me their proof of income.  I also gave them an application to apply for assistance w/ the [place].  I advised they can hand deliver the application or bring it along with the required docs needed for processing to me and I will email it to [place].  She has my card for any questions or concerns she may have in the future.

## 2018-02-27 NOTE — Progress Notes (Signed)
Patient Care Team: [name], MD as PCP - General (Cardiology)  DIAGNOSIS:  Encounter Diagnosis  Name Primary?  . Malignant neoplasm of upper-inner quadrant of right breast in female, estrogen receptor negative ([place])     SUMMARY OF ONCOLOGIC HISTORY:   Malignant neoplasm of upper-inner quadrant of right breast in female, estrogen receptor negative ([place])   02/01/2018 Initial Diagnosis    Screening detected right breast mass in the upper inner quadrant measuring 1.5 cm, with additional enhancement it measured 2.2 cm.  No axillary lymph nodes.  Biopsy revealed IDC with DCIS, grade 3, ER 0%, PR 0%, HER-2 negative, Ki-67 80%, T1CN0 stage Ib clinical stage      02/20/2018 Genetic Testing    BRCA1 c.2722G>T pathogenic mutation and MLH1 c.979C>G and RAD51C c.506T>C VUS identified on the common hereditary cancer panel.  The Hereditary Gene Panel offered by [facility] includes sequencing and/or deletion duplication testing of the following 47 genes: APC, ATM, AXIN2, BARD1, BMPR1A, BRCA1, BRCA2, BRIP1, CDH1, CDK4, CDKN2A (p14ARF), CDKN2A (p16INK4a), CHEK2, CTNNA1, DICER1, EPCAM (Deletion/duplication testing only), GREM1 (promoter region deletion/duplication testing only), KIT, MEN1, MLH1, MSH2, MSH3, MSH6, MUTYH, NBN, NF1, NHTL1, PALB2, PDGFRA, PMS2, POLD1, POLE, PTEN, RAD50, RAD51C, RAD51D, SDHB, SDHC, SDHD, SMAD4, SMARCA4. STK11, TP53, TSC1, TSC2, and VHL.  The following genes were evaluated for sequence changes only: SDHA and HOXB13 c.251G>A variant only. The report date is February 20, 2018.       02/20/2018 -  Neo-Adjuvant Chemotherapy    Neoadjuvant dose dense Adriamycin and Cytoxan x4 followed by Taxol and carboplatin       CHIEF COMPLIANT: Cycle 1 day 8 dose dense Adriamycin and Cytoxan  INTERVAL HISTORY: [name] is a 32 year old with above-mentioned history of triple negative right breast cancer along with a BRCA1 mutation.  She is currently on neoadjuvant chemotherapy with dose  dense Adriamycin and Cytoxan.  She had nausea that lasted 3-4 days after the chemotherapy.  She took a Phenergan which appears to have helped but it made her drowsy.  She stopped taking it for the past couple of days and it appears that the nausea has come back.  Is also complaining of constipation as result of the antinausea medications.  She is having epigastric discomfort and burning sensation from gastritis.  She has had a previous history of gastritis as well.  REVIEW OF SYSTEMS:   Constitutional: Denies fevers, chills or abnormal weight loss Eyes: Denies blurriness of vision Ears, nose, mouth, throat, and face: Denies mucositis or sore throat Respiratory: Denies cough, dyspnea or wheezes Cardiovascular: Denies palpitation, chest discomfort Gastrointestinal: Nausea constipation and gastritis Skin: Denies abnormal skin rashes Lymphatics: Denies new lymphadenopathy or easy bruising Neurological:Denies numbness, tingling or new weaknesses Behavioral/Psych: Mood is stable, no new changes  Extremities: No lower extremity edema All other systems were reviewed with the patient and are negative.  I have reviewed the past medical history, past surgical history, social history and family history with the patient and they are unchanged from previous note.  ALLERGIES:  is allergic to lidocaine.  MEDICATIONS:  Current Outpatient Medications  Medication Sig Dispense Refill  . dexamethasone (DECADRON) 4 MG tablet Take 4 mg by mouth daily after breakfast.    . gabapentin (NEURONTIN) 300 MG capsule Take 300 mg by mouth 3 (three) times daily.    Marland Kitchen ibuprofen (ADVIL,MOTRIN) 600 MG tablet Take 1 tablet (600 mg total) by mouth 3 (three) times daily. 30 tablet 0  . lidocaine-prilocaine (EMLA) cream Apply 1 application  topically as needed.    Marland Kitchen LORazepam (ATIVAN) 0.5 MG tablet Take 0.5 mg by mouth at bedtime.    . ondansetron (ZOFRAN) 8 MG tablet Take 8 mg by mouth 2 (two) times daily as needed for nausea  or vomiting.    Marland Kitchen oxyCODONE-acetaminophen (PERCOCET) 10-325 MG tablet Take 1 tablet by mouth every 6 (six) hours as needed for pain. 10 tablet 0  . pantoprazole (PROTONIX) 40 MG tablet Take 1 tablet (40 mg total) by mouth daily. 30 tablet 0  . prochlorperazine (COMPAZINE) 10 MG tablet Take 10 mg by mouth every 6 (six) hours as needed for nausea or vomiting.    . promethazine (PHENERGAN) 25 MG tablet Take 0.5 tablets (12.5 mg total) by mouth every 6 (six) hours as needed for nausea or vomiting. 120 tablet 0   No current facility-administered medications for this visit.     PHYSICAL EXAMINATION: ECOG PERFORMANCE STATUS: 1 - Symptomatic but completely ambulatory  Vitals:   02/27/18 1447  BP: 98/68  Pulse: 73  Resp: 19  Temp: 98.7 F (37.1 C)  SpO2: 100%   Filed Weights   02/27/18 1447  Weight: 123 lb 12.8 oz (56.2 kg)    GENERAL:alert, no distress and comfortable SKIN: skin color, texture, turgor are normal, no rashes or significant lesions EYES: normal, Conjunctiva are pink and non-injected, sclera clear OROPHARYNX:no exudate, no erythema and lips, buccal mucosa, and tongue normal  NECK: supple, thyroid normal size, non-tender, without nodularity LYMPH:  no palpable lymphadenopathy in the cervical, axillary or inguinal LUNGS: clear to auscultation and percussion with normal breathing effort HEART: regular rate & rhythm and no murmurs and no lower extremity edema ABDOMEN:abdomen soft, non-tender and normal bowel sounds MUSCULOSKELETAL:no cyanosis of digits and no clubbing  NEURO: alert & oriented x 3 with fluent speech, no focal motor/sensory deficits EXTREMITIES: No lower extremity edema  LABORATORY DATA:  I have reviewed the data as listed CMP Latest Ref Rng & Units 02/27/2018 02/18/2018 11/20/2013  Glucose 70 - 140 mg/dL 92 81 91  BUN 7 - 26 mg/dL _0 Creatinine 0.60 - 1.10 mg/dL 0.71 0.85 0.51  Sodium 136 - 145 mmol/L 137 139 134(L)  Potassium 3.5 - 5.1 mmol/L 4.3 3.8  3.9  Chloride 98 - 109 mmol/L 99 105 102  CO2 22 - 29 mmol/L 30(H) 26 24  Calcium 8.4 - 10.4 mg/dL 10.7(H) 9.8 9.1  Total Protein 6.4 - 8.3 g/dL 7.5 7.3 4.6(L)  Total Bilirubin 0.2 - 1.2 mg/dL 0.4 0.4 0.2(L)  Alkaline Phos 40 - 150 U/L 67 46 131(H)  AST 5 - 34 U/L _1 ALT 0 - 55 U/L _2 Lab Results  Component Value Date   WBC 1.8 (L) 02/27/2018   HGB 11.0 (L) 11/20/2013   HCT 39.7 02/27/2018   MCV 88.6 02/27/2018   PLT 167 02/27/2018   NEUTROABS 0.8 (L) 02/27/2018    ASSESSMENT & PLAN:  Malignant neoplasm of upper-inner quadrant of right breast in female, estrogen receptor negative ([place]) 02/01/2018:Screening detected right breast mass in the upper inner quadrant measuring 1.5 cm, with additional enhancement it measured 2.2 cm.  No axillary lymph nodes.  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  Genetic counseling and testing 2. neoadjuvant chemotherapy with dose dense Adriamycin and Cytoxan every 2 weeks x4 followed by Taxol weekly x12  accompanied by carboplatin  every 3 weeks x4 3. Followed by surgical options dependent on whether she has genetic mutation. 4.  Adjuvant radiation if she undergoes breast conserving surgery CT CAP and bone scan: Negative for metastases ---------------------------------------------------------------------------------------------------------------------------------  Current treatment: Cycle 1 day 8 of dose dense Adriamycin and Cytoxan Chemo toxicities: 1. Nausea: Moderate in severity. 2. constipation  3 chemo induced leukopenia and neutropenia: Expected for the nadir count.. The dosage will remain the same for cycle 2.  Her insurance does not authorize on pro.  Patient has 3 children and has to travel a very long distance to get here for the Neulasta injection.  Because of this we will attempt to do a peer to peer discussion to get onto  authorized. Return to clinic 1 week for cycle 2  I spent 25 minutes talking to the patient of which more than half was spent in counseling and coordination of care.  No orders of the defined types were placed in this encounter.  The patient has a good understanding of the overall plan. she agrees with it. she will call with any problems that may develop before the next visit here.   Harriette Ohara, MD 02/27/18

## 2018-03-04 DIAGNOSIS — J069 Acute upper respiratory infection, unspecified: Secondary | ICD-10-CM | POA: Diagnosis not present

## 2018-03-04 DIAGNOSIS — Z6823 Body mass index (BMI) 23.0-23.9, adult: Secondary | ICD-10-CM | POA: Diagnosis not present

## 2018-03-04 DIAGNOSIS — J029 Acute pharyngitis, unspecified: Secondary | ICD-10-CM | POA: Diagnosis not present

## 2018-03-06 ENCOUNTER — Inpatient Hospital Stay: Payer: BLUE CROSS/BLUE SHIELD

## 2018-03-06 ENCOUNTER — Telehealth: Payer: Self-pay

## 2018-03-06 ENCOUNTER — Other Ambulatory Visit: Payer: BLUE CROSS/BLUE SHIELD

## 2018-03-06 ENCOUNTER — Encounter: Payer: Self-pay | Admitting: Adult Health

## 2018-03-06 ENCOUNTER — Inpatient Hospital Stay (HOSPITAL_BASED_OUTPATIENT_CLINIC_OR_DEPARTMENT_OTHER): Payer: BLUE CROSS/BLUE SHIELD | Admitting: Adult Health

## 2018-03-06 VITALS — BP 99/58 | HR 84 | Temp 98.8°F | Resp 20 | Ht 61.0 in | Wt 125.7 lb

## 2018-03-06 DIAGNOSIS — Z1501 Genetic susceptibility to malignant neoplasm of breast: Secondary | ICD-10-CM

## 2018-03-06 DIAGNOSIS — C50211 Malignant neoplasm of upper-inner quadrant of right female breast: Secondary | ICD-10-CM

## 2018-03-06 DIAGNOSIS — K2970 Gastritis, unspecified, without bleeding: Secondary | ICD-10-CM | POA: Diagnosis not present

## 2018-03-06 DIAGNOSIS — I959 Hypotension, unspecified: Secondary | ICD-10-CM | POA: Diagnosis not present

## 2018-03-06 DIAGNOSIS — Z171 Estrogen receptor negative status [ER-]: Secondary | ICD-10-CM

## 2018-03-06 DIAGNOSIS — K5900 Constipation, unspecified: Secondary | ICD-10-CM | POA: Diagnosis not present

## 2018-03-06 DIAGNOSIS — C50011 Malignant neoplasm of nipple and areola, right female breast: Secondary | ICD-10-CM

## 2018-03-06 DIAGNOSIS — D701 Agranulocytosis secondary to cancer chemotherapy: Secondary | ICD-10-CM | POA: Diagnosis not present

## 2018-03-06 DIAGNOSIS — Z1509 Genetic susceptibility to other malignant neoplasm: Secondary | ICD-10-CM

## 2018-03-06 DIAGNOSIS — Z5111 Encounter for antineoplastic chemotherapy: Secondary | ICD-10-CM | POA: Diagnosis not present

## 2018-03-06 LAB — CMP (CANCER CENTER ONLY)
ALBUMIN: 3.9 g/dL (ref 3.5–5.0)
ALT: 17 U/L (ref 0–55)
AST: 14 U/L (ref 5–34)
Alkaline Phosphatase: 62 U/L (ref 40–150)
Anion gap: 6 (ref 3–11)
BUN: 10 mg/dL (ref 7–26)
CHLORIDE: 106 mmol/L (ref 98–109)
CO2: 27 mmol/L (ref 22–29)
CREATININE: 0.64 mg/dL (ref 0.60–1.10)
Calcium: 9.6 mg/dL (ref 8.4–10.4)
GFR, Est AFR Am: >60 mL/min (ref >60)
GFR, Estimated: >60 mL/min (ref >60)
GLUCOSE: 87 mg/dL (ref 70–140)
POTASSIUM: 3.9 mmol/L (ref 3.5–5.1)
Sodium: 139 mmol/L (ref 136–145)
Total Bilirubin: <0.2 mg/dL — ABNORMAL LOW (ref 0.2–1.2)
Total Protein: 6.9 g/dL (ref 6.4–8.3)

## 2018-03-06 LAB — CBC WITH DIFFERENTIAL (CANCER CENTER ONLY)
Basophils Absolute: 0 10*3/uL (ref 0.0–0.1)
Basophils Relative: 0 %
EOS PCT: 1 %
Eosinophils Absolute: 0.1 10*3/uL (ref 0.0–0.5)
HEMATOCRIT: 36.2 % (ref 34.8–46.6)
Hemoglobin: 12.1 g/dL (ref 11.6–15.9)
LYMPHS ABS: 2 10*3/uL (ref 0.9–3.3)
Lymphocytes Relative: 20 %
MCH: 29.7 pg (ref 25.1–34.0)
MCHC: 33.4 g/dL (ref 31.5–36.0)
MCV: 88.9 fL (ref 79.5–101.0)
MONO ABS: 0.7 10*3/uL (ref 0.1–0.9)
MONOS PCT: 7 %
NEUTROS ABS: 7.2 10*3/uL — AB (ref 1.5–6.5)
Neutrophils Relative %: 72 %
Platelet Count: 207 10*3/uL (ref 145–400)
RBC: 4.07 MIL/uL (ref 3.70–5.45)
RDW: 12.2 % (ref 11.2–14.5)
WBC Count: 9.9 10*3/uL (ref 3.9–10.3)

## 2018-03-06 MED ORDER — SODIUM CHLORIDE 0.9% FLUSH
10.0000 mL | INTRAVENOUS | Status: DC | PRN
  Administered 2018-03-06: 10 mL
  Filled 2018-03-06: qty 10

## 2018-03-06 MED ORDER — SODIUM CHLORIDE 0.9% FLUSH
10.0000 mL | Freq: Once | INTRAVENOUS | Status: AC
  Administered 2018-03-06: 10 mL
  Filled 2018-03-06: qty 10

## 2018-03-06 MED ORDER — HEPARIN SOD (PORK) LOCK FLUSH 100 UNIT/ML IV SOLN
500.0000 [IU] | Freq: Once | INTRAVENOUS | Status: AC | PRN
  Administered 2018-03-06: 500 [IU]
  Filled 2018-03-06: qty 5

## 2018-03-06 MED ORDER — PALONOSETRON HCL INJECTION 0.25 MG/5ML
0.2500 mg | Freq: Once | INTRAVENOUS | Status: AC
  Administered 2018-03-06: 0.25 mg via INTRAVENOUS

## 2018-03-06 MED ORDER — DOXORUBICIN HCL CHEMO IV INJECTION 2 MG/ML
60.0000 mg/m2 | Freq: Once | INTRAVENOUS | Status: AC
  Administered 2018-03-06: 94 mg via INTRAVENOUS
  Filled 2018-03-06: qty 47

## 2018-03-06 MED ORDER — ACETAMINOPHEN 325 MG PO TABS
650.0000 mg | ORAL_TABLET | Freq: Once | ORAL | Status: AC
  Administered 2018-03-06: 650 mg via ORAL

## 2018-03-06 MED ORDER — SODIUM CHLORIDE 0.9 % IV SOLN
Freq: Once | INTRAVENOUS | Status: AC
  Administered 2018-03-06: 14:00:00 via INTRAVENOUS

## 2018-03-06 MED ORDER — SODIUM CHLORIDE 0.9 % IV SOLN
600.0000 mg/m2 | Freq: Once | INTRAVENOUS | Status: AC
  Administered 2018-03-06: 940 mg via INTRAVENOUS
  Filled 2018-03-06: qty 47

## 2018-03-06 MED ORDER — HEPARIN SOD (PORK) LOCK FLUSH 100 UNIT/ML IV SOLN
500.0000 [IU] | Freq: Once | INTRAVENOUS | Status: AC
  Administered 2018-03-06: 500 [IU]
  Filled 2018-03-06: qty 5

## 2018-03-06 MED ORDER — PALONOSETRON HCL INJECTION 0.25 MG/5ML
INTRAVENOUS | Status: AC
  Filled 2018-03-06: qty 5

## 2018-03-06 MED ORDER — FOSAPREPITANT DIMEGLUMINE INJECTION 150 MG
Freq: Once | INTRAVENOUS | Status: AC
  Administered 2018-03-06: 14:00:00 via INTRAVENOUS
  Filled 2018-03-06: qty 5

## 2018-03-06 MED ORDER — ACETAMINOPHEN 325 MG PO TABS
ORAL_TABLET | ORAL | Status: AC
  Filled 2018-03-06: qty 2
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
## 2018-03-06 NOTE — Assessment & Plan Note (Signed)
02/01/2018:Screening detected right breast mass in the upper inner quadrant measuring 1.5 cm, with additional enhancement it measured 2.2 cm.  No axillary lymph nodes.  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  Genetic counseling and testing 2. neoadjuvant chemotherapy with dose dense Adriamycin and Cytoxan every 2 weeks x4 followed by Taxol weekly x12  accompanied by carboplatin every 3 weeks x4 3. Followed by surgical options dependent on whether she has genetic mutation. 4.  Adjuvant radiation if she undergoes breast conserving surgery CT CAP and bone scan: Negative for metastases ---------------------------------------------------------------------------------------------------------------------------------  Current treatment: 2 day 1 of dose dense Adriamycin/Cytoxan  [name] is doing well today.  She will proceed with treatment today (as long as her CMET is within parameters).  Her CBC is normal and I reviewed this with her in detail.  Her BP is borderline low, she is asymptomatic.  I encouraged continued fluid intake, and things to look for with low blood pressure such as dizziness (particularly with position changes), chest pain, palpitations, fatigue.  She verbalized understanding.  She will return in two weeks for labs, f/u and her third cycle of neoadjuvant chemotherapy.

## 2018-03-06 NOTE — Telephone Encounter (Signed)
Called  [facility] to see if pt can be scheduled for injection appt for March 15th (Friday) Pt receives [place]. Pt currently denied for neulasta onpro, but still waiting for peer to peer to take place. Will follow up with [facility] and pharmacy on this.   Notified patient and husband that they may not get a call about an appt until tomorrow. Told pt that this RN spoke with Mike Craze, NP at [facility] regarding assisting with setting her up with an injection appt for Friday. Dr.Gudena is aware of this and will try to set up future injections at [facility] in case peer to peer unsuccessful.   Will follow up tomorrow with annie pen and patient about injection appt and further details for future injections.

## 2018-03-06 NOTE — Progress Notes (Signed)
Patient c/o headache.  Discussed with Charlestine Massed NP and VO given and read back for tylenol 650mg  for headache/once.  Gretchen Short RN

## 2018-03-06 NOTE — Patient Instructions (Signed)
Cancer Center Discharge Instructions for Patients Receiving Chemotherapy  Today you received the following chemotherapy agents Adriamycin and Cytoxan  To help prevent nausea and vomiting after your treatment, we encourage you to take your nausea medication as directed.  If you develop nausea and vomiting that is not controlled by your nausea medication, call the clinic.   BELOW ARE SYMPTOMS THAT SHOULD BE REPORTED IMMEDIATELY:  *FEVER GREATER THAN 100.5 F  *CHILLS WITH OR WITHOUT FEVER  NAUSEA AND VOMITING THAT IS NOT CONTROLLED WITH YOUR NAUSEA MEDICATION  *UNUSUAL SHORTNESS OF BREATH  *UNUSUAL BRUISING OR BLEEDING  TENDERNESS IN MOUTH AND THROAT WITH OR WITHOUT PRESENCE OF ULCERS  *URINARY PROBLEMS  *BOWEL PROBLEMS  UNUSUAL RASH Items with * indicate a potential emergency and should be followed up as soon as possible.  Feel free to call the clinic should you have any questions or concerns. The clinic phone number is [phone].  Please show the CHEMO ALERT CARD at check-in to the Emergency Department and triage nurse.   

## 2018-03-07 ENCOUNTER — Telehealth: Payer: Self-pay | Admitting: Adult Health

## 2018-03-07 NOTE — Telephone Encounter (Signed)
Per 3/13 no los

## 2018-03-08 ENCOUNTER — Other Ambulatory Visit: Payer: Self-pay

## 2018-03-08 ENCOUNTER — Encounter (HOSPITAL_COMMUNITY): Payer: Self-pay

## 2018-03-08 ENCOUNTER — Inpatient Hospital Stay (HOSPITAL_COMMUNITY): Payer: BLUE CROSS/BLUE SHIELD

## 2018-03-08 ENCOUNTER — Ambulatory Visit (HOSPITAL_COMMUNITY): Payer: BLUE CROSS/BLUE SHIELD

## 2018-03-08 VITALS — BP 97/54 | HR 84 | Temp 98.9°F | Resp 20

## 2018-03-08 DIAGNOSIS — D701 Agranulocytosis secondary to cancer chemotherapy: Secondary | ICD-10-CM | POA: Diagnosis not present

## 2018-03-08 DIAGNOSIS — Z171 Estrogen receptor negative status [ER-]: Principal | ICD-10-CM

## 2018-03-08 DIAGNOSIS — C50211 Malignant neoplasm of upper-inner quadrant of right female breast: Secondary | ICD-10-CM | POA: Diagnosis not present

## 2018-03-08 MED ORDER — PEGFILGRASTIM-CBQV 6 MG/0.6ML ~~LOC~~ SOSY
6.0000 mg | PREFILLED_SYRINGE | Freq: Once | SUBCUTANEOUS | Status: AC
  Administered 2018-03-08: 6 mg via SUBCUTANEOUS
  Filled 2018-03-08: qty 0.6

## 2018-03-08 NOTE — Progress Notes (Signed)
[name] presents today for injection per the provider's orders.  Udenyca administration without incident; see MAR for injection details.  Patient tolerated procedure well and without incident.  No questions or complaints noted at this time.  Discharged ambulatory.

## 2018-03-20 ENCOUNTER — Telehealth: Payer: Self-pay | Admitting: Hematology and Oncology

## 2018-03-20 ENCOUNTER — Ambulatory Visit: Payer: BLUE CROSS/BLUE SHIELD

## 2018-03-20 ENCOUNTER — Inpatient Hospital Stay: Payer: BLUE CROSS/BLUE SHIELD

## 2018-03-20 ENCOUNTER — Other Ambulatory Visit: Payer: BLUE CROSS/BLUE SHIELD

## 2018-03-20 ENCOUNTER — Inpatient Hospital Stay (HOSPITAL_BASED_OUTPATIENT_CLINIC_OR_DEPARTMENT_OTHER): Payer: BLUE CROSS/BLUE SHIELD | Admitting: Hematology and Oncology

## 2018-03-20 VITALS — BP 102/63 | HR 80 | Temp 98.4°F | Resp 18 | Ht 61.0 in | Wt 129.6 lb

## 2018-03-20 DIAGNOSIS — D701 Agranulocytosis secondary to cancer chemotherapy: Secondary | ICD-10-CM | POA: Diagnosis not present

## 2018-03-20 DIAGNOSIS — D649 Anemia, unspecified: Secondary | ICD-10-CM | POA: Diagnosis not present

## 2018-03-20 DIAGNOSIS — K5900 Constipation, unspecified: Secondary | ICD-10-CM | POA: Diagnosis not present

## 2018-03-20 DIAGNOSIS — C50011 Malignant neoplasm of nipple and areola, right female breast: Secondary | ICD-10-CM

## 2018-03-20 DIAGNOSIS — Z5111 Encounter for antineoplastic chemotherapy: Secondary | ICD-10-CM | POA: Diagnosis not present

## 2018-03-20 DIAGNOSIS — R110 Nausea: Secondary | ICD-10-CM | POA: Diagnosis not present

## 2018-03-20 DIAGNOSIS — C50211 Malignant neoplasm of upper-inner quadrant of right female breast: Secondary | ICD-10-CM | POA: Diagnosis not present

## 2018-03-20 DIAGNOSIS — Z1501 Genetic susceptibility to malignant neoplasm of breast: Secondary | ICD-10-CM | POA: Diagnosis not present

## 2018-03-20 DIAGNOSIS — K2970 Gastritis, unspecified, without bleeding: Secondary | ICD-10-CM | POA: Diagnosis not present

## 2018-03-20 DIAGNOSIS — Z171 Estrogen receptor negative status [ER-]: Principal | ICD-10-CM

## 2018-03-20 LAB — CBC WITH DIFFERENTIAL (CANCER CENTER ONLY)
BASOS PCT: 0 %
Basophils Absolute: 0.1 10*3/uL (ref 0.0–0.1)
Eosinophils Absolute: 0 10*3/uL (ref 0.0–0.5)
Eosinophils Relative: 0 %
HEMATOCRIT: 33.7 % — AB (ref 34.8–46.6)
HEMOGLOBIN: 11.3 g/dL — AB (ref 11.6–15.9)
Lymphocytes Relative: 14 %
Lymphs Abs: 1.8 10*3/uL (ref 0.9–3.3)
MCH: 29.9 pg (ref 25.1–34.0)
MCHC: 33.5 g/dL (ref 31.5–36.0)
MCV: 89.2 fL (ref 79.5–101.0)
MONO ABS: 0.8 10*3/uL (ref 0.1–0.9)
MONOS PCT: 6 %
NEUTROS ABS: 10.1 10*3/uL — AB (ref 1.5–6.5)
NEUTROS PCT: 80 %
Platelet Count: 163 10*3/uL (ref 145–400)
RBC: 3.78 MIL/uL (ref 3.70–5.45)
RDW: 13.2 % (ref 11.2–14.5)
WBC Count: 12.7 10*3/uL — ABNORMAL HIGH (ref 3.9–10.3)

## 2018-03-20 LAB — CMP (CANCER CENTER ONLY)
ALBUMIN: 3.9 g/dL (ref 3.5–5.0)
ALK PHOS: 70 U/L (ref 40–150)
ALT: 24 U/L (ref 0–55)
ANION GAP: 7 (ref 3–11)
AST: 17 U/L (ref 5–34)
BUN: 8 mg/dL (ref 7–26)
CALCIUM: 9.6 mg/dL (ref 8.4–10.4)
CO2: 28 mmol/L (ref 22–29)
Chloride: 103 mmol/L (ref 98–109)
Creatinine: 0.67 mg/dL (ref 0.60–1.10)
GFR, Estimated: >60 mL/min (ref >60)
GLUCOSE: 81 mg/dL (ref 70–140)
POTASSIUM: 4 mmol/L (ref 3.5–5.1)
SODIUM: 138 mmol/L (ref 136–145)
Total Bilirubin: <0.2 mg/dL — ABNORMAL LOW (ref 0.2–1.2)
Total Protein: 6.8 g/dL (ref 6.4–8.3)

## 2018-03-20 MED ORDER — SODIUM CHLORIDE 0.9 % IV SOLN
Freq: Once | INTRAVENOUS | Status: AC
  Administered 2018-03-20: 14:00:00 via INTRAVENOUS
  Filled 2018-03-20: qty 5

## 2018-03-20 MED ORDER — HEPARIN SOD (PORK) LOCK FLUSH 100 UNIT/ML IV SOLN
500.0000 [IU] | Freq: Once | INTRAVENOUS | Status: AC | PRN
  Administered 2018-03-20: 500 [IU]
  Filled 2018-03-20: qty 5

## 2018-03-20 MED ORDER — SODIUM CHLORIDE 0.9 % IV SOLN
600.0000 mg/m2 | Freq: Once | INTRAVENOUS | Status: AC
  Administered 2018-03-20: 940 mg via INTRAVENOUS
  Filled 2018-03-20: qty 47

## 2018-03-20 MED ORDER — PALONOSETRON HCL INJECTION 0.25 MG/5ML
INTRAVENOUS | Status: AC
  Filled 2018-03-20: qty 5

## 2018-03-20 MED ORDER — SODIUM CHLORIDE 0.9% FLUSH
10.0000 mL | Freq: Once | INTRAVENOUS | Status: DC
  Filled 2018-03-20: qty 10

## 2018-03-20 MED ORDER — SODIUM CHLORIDE 0.9 % IV SOLN
Freq: Once | INTRAVENOUS | Status: AC
  Administered 2018-03-20: 14:00:00 via INTRAVENOUS

## 2018-03-20 MED ORDER — PALONOSETRON HCL INJECTION 0.25 MG/5ML
0.2500 mg | Freq: Once | INTRAVENOUS | Status: AC
  Administered 2018-03-20: 0.25 mg via INTRAVENOUS

## 2018-03-20 MED ORDER — SODIUM CHLORIDE 0.9% FLUSH
10.0000 mL | INTRAVENOUS | Status: DC | PRN
  Administered 2018-03-20: 10 mL
  Filled 2018-03-20: qty 10

## 2018-03-20 MED ORDER — SODIUM CHLORIDE 0.9% FLUSH
10.0000 mL | Freq: Once | INTRAVENOUS | Status: AC
  Administered 2018-03-20: 10 mL
  Filled 2018-03-20: qty 10

## 2018-03-20 MED ORDER — DOXORUBICIN HCL CHEMO IV INJECTION 2 MG/ML
60.0000 mg/m2 | Freq: Once | INTRAVENOUS | Status: AC
  Administered 2018-03-20: 94 mg via INTRAVENOUS
  Filled 2018-03-20: qty 47

## 2018-03-20 NOTE — Telephone Encounter (Signed)
Gave avs and calendar ° °

## 2018-03-20 NOTE — Telephone Encounter (Signed)
Gave avs and calendar waiting for 4/10

## 2018-03-20 NOTE — Patient Instructions (Addendum)
[facility] Discharge Instructions for Patients Receiving Chemotherapy  Today you received the following chemotherapy agents: Doxorubicin (Adriamycin) and Cyclophosphamide (Cytoxan)   To help prevent nausea and vomiting after your treatment, we encourage you to take your nausea medication as directed. Received Aloxi during treatment today-->take Compazine (not Zofran) for the next 3 days as needed.   If you develop nausea and vomiting that is not controlled by your nausea medication, call the clinic.   BELOW ARE SYMPTOMS THAT SHOULD BE REPORTED IMMEDIATELY:  *FEVER GREATER THAN 100.5 F  *CHILLS WITH OR WITHOUT FEVER  NAUSEA AND VOMITING THAT IS NOT CONTROLLED WITH YOUR NAUSEA MEDICATION  *UNUSUAL SHORTNESS OF BREATH  *UNUSUAL BRUISING OR BLEEDING  TENDERNESS IN MOUTH AND THROAT WITH OR WITHOUT PRESENCE OF ULCERS  *URINARY PROBLEMS  *BOWEL PROBLEMS  UNUSUAL RASH Items with * indicate a potential emergency and should be followed up as soon as possible.  Feel free to call the clinic should you have any questions or concerns. The clinic phone number is [phone].  Please show the [facility] at check-in to the Emergency Department and triage nurse.
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
## 2018-03-20 NOTE — Assessment & Plan Note (Signed)
02/01/2018:Screening detected right breast mass in the upper inner quadrant measuring 1.5 cm, with additional enhancement it measured 2.2 cm.  No axillary lymph nodes.  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  Genetic counseling and testing 2. neoadjuvant chemotherapy with dose dense Adriamycin and Cytoxan every 2 weeks x4 followed by Taxol weekly x12  accompanied by carboplatin every 3 weeks x4 3. Followed by surgical options dependent on whether she has genetic mutation. 4.  Adjuvant radiation if she undergoes breast conserving surgery CT CAP and bone scan: Negative for metastases ---------------------------------------------------------------------------------------------------------------------------------  Current treatment: 3 day 1 of dose dense Adriamycin/Cytoxan Chemo toxicities: 1.  Nausea on days 2-5 2. Fatigue 3.  Flulike illness due to Neulasta on days 4-7 4.  Alopecia due to chemo  Labs have been reviewed Which showed mild anemia  Return to clinic in 2 weeks for cycle 4.

## 2018-03-21 ENCOUNTER — Telehealth: Payer: Self-pay | Admitting: Hematology and Oncology

## 2018-03-21 ENCOUNTER — Encounter: Payer: Self-pay | Admitting: *Deleted

## 2018-03-21 NOTE — Telephone Encounter (Signed)
Called regarding 4/10 °

## 2018-03-22 ENCOUNTER — Ambulatory Visit (HOSPITAL_COMMUNITY): Payer: BLUE CROSS/BLUE SHIELD

## 2018-03-22 ENCOUNTER — Inpatient Hospital Stay (HOSPITAL_COMMUNITY): Payer: BLUE CROSS/BLUE SHIELD

## 2018-03-22 ENCOUNTER — Encounter (HOSPITAL_COMMUNITY): Payer: Self-pay

## 2018-03-22 VITALS — BP 115/63 | HR 82 | Temp 98.7°F

## 2018-03-22 DIAGNOSIS — D701 Agranulocytosis secondary to cancer chemotherapy: Secondary | ICD-10-CM | POA: Diagnosis not present

## 2018-03-22 DIAGNOSIS — C50211 Malignant neoplasm of upper-inner quadrant of right female breast: Secondary | ICD-10-CM | POA: Diagnosis not present

## 2018-03-22 DIAGNOSIS — Z171 Estrogen receptor negative status [ER-]: Principal | ICD-10-CM

## 2018-03-22 MED ORDER — PEGFILGRASTIM-CBQV 6 MG/0.6ML ~~LOC~~ SOSY
6.0000 mg | PREFILLED_SYRINGE | Freq: Once | SUBCUTANEOUS | Status: AC
  Administered 2018-03-22: 6 mg via SUBCUTANEOUS
  Filled 2018-03-22: qty 0.6

## 2018-03-22 NOTE — Patient Instructions (Signed)
[facility] at [facility]  Discharge Instructions:  You received Udenyca today.  Call for any fevers or chills or any other symptoms.   _______________________________________________________________  Thank you for choosing [facility] at [facility] to provide your oncology and hematology care.  To afford each patient quality time with our providers, please arrive at least 15 minutes before your scheduled appointment.  You need to re-schedule your appointment if you arrive 10 or more minutes late.  We strive to give you quality time with our providers, and arriving late affects you and other patients whose appointments are after yours.  Also, if you no show three or more times for appointments you may be dismissed from the clinic.  Again, thank you for choosing [facility] at [facility] hope is that these requests will allow you access to exceptional care and in a timely manner. _______________________________________________________________  If you have questions after your visit, please contact our office at [phone] between the hours of 8:30 a.m. and 5:00 p.m. Voicemails left after 4:30 p.m. will not be returned until the following business day. _______________________________________________________________  For prescription refill requests, have your pharmacy contact our office. _______________________________________________________________  Recommendations made by the consultant and any test results will be sent to your referring physician. _______________________________________________________________

## 2018-03-22 NOTE — Progress Notes (Signed)
Patient to treatment area for udenyca shot.  Patient denied fevers and chills.  Stated fatigued but no worsening.  Denied SOB, pain, nausea or vomiting.  No s/s of distress noted.    Patient tolerated shot with no complaints voiced.  No bruising or swelling noted at site.  Band aid applied.  VSS with discharge and left ambulatory.

## 2018-03-24 ENCOUNTER — Telehealth: Payer: Self-pay | Admitting: Hematology

## 2018-03-24 ENCOUNTER — Other Ambulatory Visit: Payer: Self-pay | Admitting: Hematology

## 2018-03-24 MED ORDER — AMOXICILLIN-POT CLAVULANATE 875-125 MG PO TABS: 1.0000 | ORAL_TABLET | Freq: Two times a day (BID) | ORAL | 0 refills | Status: DC

## 2018-03-24 NOTE — Progress Notes (Unsigned)
augmen

## 2018-03-24 NOTE — Telephone Encounter (Signed)
Pt called in today and reported fever 100.5 at 10am today, now 99.0, along with nasal congestion for a few days, mild cough with yellownish sputum, no chest pan or SOB. She is eating and drinking OK, no diarrhea. Her last chemo AC was on 03/20/2018. I recommend her to go to ED, but she is reluctant to go, I then called in augmentin 875mg  bid for 7 days for her sinus infection. She knows to go to ED if spikes fever again or symptoms get worse, she will call us tomorrow to update [place], and decide if she needs to see [place] tomorrow. I will copy this message to her primary oncologist Dr. Lindi Adie.  Truitt Merle  03/24/2018

## 2018-03-25 ENCOUNTER — Telehealth: Payer: Self-pay | Admitting: Hematology

## 2018-03-25 ENCOUNTER — Telehealth: Payer: Self-pay

## 2018-03-25 NOTE — Telephone Encounter (Signed)
FAXED RECORDS TO [facility] ID [id number]

## 2018-03-25 NOTE — Telephone Encounter (Signed)
Patient called over the weekend with fever, nasal drainage, sinus pressure. On call physician prescribed an antibiotic. I called pt this morning to see how she was feeling. She is feeling better and feels like the medication is working and does not need to come in as of now. She is to call if fever returns or symptoms worsen.  Cyndia Bent RN

## 2018-03-26 ENCOUNTER — Telehealth: Payer: Self-pay

## 2018-03-26 NOTE — Telephone Encounter (Signed)
Received call from [facility] from [facility] regarding request for updates and office notes. Faxed information to [phone] CM [phone]. Spoke with Colletta [place] to confirm receipt.

## 2018-03-26 NOTE — Telephone Encounter (Signed)
Pt called to report that she had been starting to cough a lot since yesterday. Pt had been afebrile since Sunday afternoon, once antibiotic started. Pt on augmentin. She reports sinus congestion, ear ache, and sinus drainage. Describes yellow to green sinus drainage. Pt has a moist non productive cough. She is currently taking Claritin and tylenol to help.   Dr.Gudena made aware and advised to try OTC expectorant/ decongestant and to also try benadryl at night to help with sinus congestion. Encouraged pt to hydrate more with oral fluids and to monitor and log temperature. If symptoms gets worse overnight, pt will need to go to closes UR or ED. Told pt to call back tomorrow if symptoms are worsening and we will have her set up an appt with our symptom management clinic. Pt will try home medication management first and will call with further concerns. Pt appreciative of time.

## 2018-03-28 ENCOUNTER — Telehealth: Payer: Self-pay | Admitting: *Deleted

## 2018-03-28 NOTE — Telephone Encounter (Signed)
Received call from pt stating that she wanted to give update regarding her cold symptoms & new symptoms.  She states that she is on D5 of ATB & feeling some better but still coughing up yellow/green mucous & has sinus pressure.  She is able to blow her nose & that is more clear now.  She is having low grade fevers but better-98-99 degrees.  Theraflu is helping.  Now she is having some vaginal discomfort & reports bumps & stinging of vaginal area.  She has used Monistat once & that really burned.  She is allergic to Lidocaine.  Message routed to Dr Rolla Plate RN.

## 2018-03-29 ENCOUNTER — Telehealth: Payer: Self-pay | Admitting: Medical Oncology

## 2018-03-29 ENCOUNTER — Other Ambulatory Visit: Payer: Self-pay

## 2018-03-29 MED ORDER — FLUCONAZOLE 100 MG PO TABS: 100.0000 mg | ORAL_TABLET | Freq: Every day | ORAL | 0 refills | Status: DC

## 2018-03-29 NOTE — Telephone Encounter (Signed)
Returned pt call regarding vaginal burning, itching, and bumps. Per Dr. Lindi Adie she is to take diflucan for 7 days. Take two tablets day 1. Script sent to [facility]. She wanted me to inform Dr. Lindi Adie her cold symptoms have not improved with the amoxicillin. He is out of the office but will let him know when he returns.  Cyndia Bent RN

## 2018-03-29 NOTE — Telephone Encounter (Signed)
Ext /internal vaginal "bumps" , burn, slight itching, dry  x 2 days. Never had them before.  Monistat burns . Appt with GYN Monday. Please advise.  Also has one "blister? on her tongue.

## 2018-04-01 DIAGNOSIS — H6983 Other specified disorders of Eustachian tube, bilateral: Secondary | ICD-10-CM | POA: Diagnosis not present

## 2018-04-01 DIAGNOSIS — Z6824 Body mass index (BMI) 24.0-24.9, adult: Secondary | ICD-10-CM | POA: Diagnosis not present

## 2018-04-01 DIAGNOSIS — J069 Acute upper respiratory infection, unspecified: Secondary | ICD-10-CM | POA: Diagnosis not present

## 2018-04-02 ENCOUNTER — Other Ambulatory Visit: Payer: Self-pay | Admitting: *Deleted

## 2018-04-02 MED ORDER — PANTOPRAZOLE SODIUM 40 MG PO TBEC: 40.0000 mg | DELAYED_RELEASE_TABLET | Freq: Every day | ORAL | 3 refills | Status: DC

## 2018-04-02 MED ORDER — LORAZEPAM 0.5 MG PO TABS: 0.5000 mg | ORAL_TABLET | Freq: Every day | ORAL | 1 refills | Status: DC

## 2018-04-03 ENCOUNTER — Inpatient Hospital Stay: Payer: BLUE CROSS/BLUE SHIELD

## 2018-04-03 ENCOUNTER — Encounter: Payer: Self-pay | Admitting: Adult Health

## 2018-04-03 ENCOUNTER — Inpatient Hospital Stay (HOSPITAL_BASED_OUTPATIENT_CLINIC_OR_DEPARTMENT_OTHER): Payer: BLUE CROSS/BLUE SHIELD | Admitting: Adult Health

## 2018-04-03 ENCOUNTER — Inpatient Hospital Stay: Payer: BLUE CROSS/BLUE SHIELD | Attending: Genetic Counselor

## 2018-04-03 VITALS — BP 101/63 | HR 81 | Temp 98.9°F | Resp 18 | Ht 61.0 in | Wt 128.7 lb

## 2018-04-03 DIAGNOSIS — Z171 Estrogen receptor negative status [ER-]: Secondary | ICD-10-CM | POA: Diagnosis not present

## 2018-04-03 DIAGNOSIS — B379 Candidiasis, unspecified: Secondary | ICD-10-CM | POA: Diagnosis not present

## 2018-04-03 DIAGNOSIS — R05 Cough: Secondary | ICD-10-CM | POA: Diagnosis not present

## 2018-04-03 DIAGNOSIS — C50011 Malignant neoplasm of nipple and areola, right female breast: Secondary | ICD-10-CM

## 2018-04-03 DIAGNOSIS — Z5111 Encounter for antineoplastic chemotherapy: Secondary | ICD-10-CM | POA: Diagnosis not present

## 2018-04-03 DIAGNOSIS — C50211 Malignant neoplasm of upper-inner quadrant of right female breast: Secondary | ICD-10-CM

## 2018-04-03 LAB — CMP (CANCER CENTER ONLY)
ALT: 27 U/L (ref 0–55)
AST: 19 U/L (ref 5–34)
Albumin: 3.9 g/dL (ref 3.5–5.0)
Alkaline Phosphatase: 87 U/L (ref 40–150)
Anion gap: 8 (ref 3–11)
BILIRUBIN TOTAL: 0.3 mg/dL (ref 0.2–1.2)
BUN: 7 mg/dL (ref 7–26)
CO2: 28 mmol/L (ref 22–29)
CREATININE: 0.64 mg/dL (ref 0.60–1.10)
Calcium: 9.9 mg/dL (ref 8.4–10.4)
Chloride: 103 mmol/L (ref 98–109)
Glucose, Bld: 97 mg/dL (ref 70–140)
Potassium: 3.6 mmol/L (ref 3.5–5.1)
Sodium: 139 mmol/L (ref 136–145)
TOTAL PROTEIN: 7.1 g/dL (ref 6.4–8.3)

## 2018-04-03 LAB — CBC WITH DIFFERENTIAL (CANCER CENTER ONLY)
BASOS ABS: 0 10*3/uL (ref 0.0–0.1)
BASOS PCT: 0 %
EOS ABS: 0 10*3/uL (ref 0.0–0.5)
EOS PCT: 0 %
HCT: 33.8 % — ABNORMAL LOW (ref 34.8–46.6)
Hemoglobin: 11.5 g/dL — ABNORMAL LOW (ref 11.6–15.9)
Lymphocytes Relative: 16 %
Lymphs Abs: 1.3 10*3/uL (ref 0.9–3.3)
MCH: 30.4 pg (ref 25.1–34.0)
MCHC: 33.9 g/dL (ref 31.5–36.0)
MCV: 89.5 fL (ref 79.5–101.0)
Monocytes Absolute: 0.4 10*3/uL (ref 0.1–0.9)
Monocytes Relative: 5 %
Neutro Abs: 6.3 10*3/uL (ref 1.5–6.5)
Neutrophils Relative %: 79 %
PLATELETS: 255 10*3/uL (ref 145–400)
RBC: 3.78 MIL/uL (ref 3.70–5.45)
RDW: 14.8 % — ABNORMAL HIGH (ref 11.2–14.5)
WBC: 8.1 10*3/uL (ref 3.9–10.3)

## 2018-04-03 MED ORDER — SODIUM CHLORIDE 0.9% FLUSH
10.0000 mL | Freq: Once | INTRAVENOUS | Status: AC
  Administered 2018-04-03: 10 mL
  Filled 2018-04-03: qty 10

## 2018-04-03 MED ORDER — CYCLOPHOSPHAMIDE CHEMO INJECTION 1 GM
600.0000 mg/m2 | Freq: Once | INTRAMUSCULAR | Status: AC
  Administered 2018-04-03: 940 mg via INTRAVENOUS
  Filled 2018-04-03: qty 47

## 2018-04-03 MED ORDER — FLUCONAZOLE 200 MG PO TABS: 200.0000 mg | ORAL_TABLET | Freq: Every day | ORAL | 0 refills | Status: DC

## 2018-04-03 MED ORDER — SODIUM CHLORIDE 0.9 % IV SOLN
Freq: Once | INTRAVENOUS | Status: AC
  Administered 2018-04-03: 13:00:00 via INTRAVENOUS
  Filled 2018-04-03: qty 5

## 2018-04-03 MED ORDER — PALONOSETRON HCL INJECTION 0.25 MG/5ML
INTRAVENOUS | Status: AC
  Filled 2018-04-03: qty 5

## 2018-04-03 MED ORDER — PALONOSETRON HCL INJECTION 0.25 MG/5ML
0.2500 mg | Freq: Once | INTRAVENOUS | Status: AC
  Administered 2018-04-03: 0.25 mg via INTRAVENOUS

## 2018-04-03 MED ORDER — LORATADINE 10 MG PO TABS: 10.0000 mg | ORAL_TABLET | Freq: Every day | ORAL | 0 refills | Status: DC

## 2018-04-03 MED ORDER — DOXORUBICIN HCL CHEMO IV INJECTION 2 MG/ML
60.0000 mg/m2 | Freq: Once | INTRAVENOUS | Status: AC
  Administered 2018-04-03: 94 mg via INTRAVENOUS
  Filled 2018-04-03: qty 47

## 2018-04-03 MED ORDER — SODIUM CHLORIDE 0.9% FLUSH
10.0000 mL | INTRAVENOUS | Status: DC | PRN
  Administered 2018-04-03: 10 mL
  Filled 2018-04-03: qty 10

## 2018-04-03 MED ORDER — HEPARIN SOD (PORK) LOCK FLUSH 100 UNIT/ML IV SOLN
500.0000 [IU] | Freq: Once | INTRAVENOUS | Status: AC | PRN
  Administered 2018-04-03: 500 [IU]
  Filled 2018-04-03: qty 5

## 2018-04-03 MED ORDER — SODIUM CHLORIDE 0.9 % IV SOLN
Freq: Once | INTRAVENOUS | Status: AC
  Administered 2018-04-03: 13:00:00 via INTRAVENOUS

## 2018-04-03 NOTE — Assessment & Plan Note (Signed)
02/01/2018:Screening detected right breast mass in the upper inner quadrant measuring 1.5 cm, with additional enhancement it measured 2.2 cm.  No axillary lymph nodes.  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  Genetic counseling and testing 2. neoadjuvant chemotherapy with dose dense Adriamycin and Cytoxan every 2 weeks x4 followed by Taxol weekly x12  accompanied by carboplatin every 3 weeks x4 3. Followed by surgical options dependent on whether she has genetic mutation. 4.  Adjuvant radiation if she undergoes breast conserving surgery CT CAP and bone scan: Negative for metastases ---------------------------------------------------------------------------------------------------------------------------------  Current treatment: 4 day 1 of dose dense Adriamycin/Cytoxan Chemo toxicities: 1.  Nausea on days 2-5 2. Fatigue 3.  Flulike illness due to Neulasta on days 4-7 4.  Alopecia due to chemo  Labs have been reviewed.  [name] will proceed with chemotherapy.  She will stay on her current antibiotics.  I sent in Diflucan 241m for her to take daily until her next appointment with [place]    Return to clinic in 2 weeks to begin Taxol/Carbo.
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
## 2018-04-03 NOTE — Patient Instructions (Signed)
[facility] Discharge Instructions for Patients Receiving Chemotherapy  Today you received the following chemotherapy agents: Doxorubicin (Adriamycin) and Cyclophosphamide (Cytoxan)   To help prevent nausea and vomiting after your treatment, we encourage you to take your nausea medication as directed. Received Aloxi during treatment today-->take Compazine (not Zofran) for the next 3 days as needed.   If you develop nausea and vomiting that is not controlled by your nausea medication, call the clinic.   BELOW ARE SYMPTOMS THAT SHOULD BE REPORTED IMMEDIATELY:  *FEVER GREATER THAN 100.5 F  *CHILLS WITH OR WITHOUT FEVER  NAUSEA AND VOMITING THAT IS NOT CONTROLLED WITH YOUR NAUSEA MEDICATION  *UNUSUAL SHORTNESS OF BREATH  *UNUSUAL BRUISING OR BLEEDING  TENDERNESS IN MOUTH AND THROAT WITH OR WITHOUT PRESENCE OF ULCERS  *URINARY PROBLEMS  *BOWEL PROBLEMS  UNUSUAL RASH Items with * indicate a potential emergency and should be followed up as soon as possible.  Feel free to call the clinic should you have any questions or concerns. The clinic phone number is [phone].  Please show the [place] [facility] at check-in to the Emergency Department and triage nurse.

## 2018-04-04 ENCOUNTER — Telehealth: Payer: Self-pay | Admitting: Adult Health

## 2018-04-04 NOTE — Telephone Encounter (Signed)
Per 4/10 no los

## 2018-04-05 ENCOUNTER — Encounter (HOSPITAL_COMMUNITY): Payer: Self-pay

## 2018-04-05 ENCOUNTER — Other Ambulatory Visit: Payer: Self-pay

## 2018-04-05 ENCOUNTER — Inpatient Hospital Stay (HOSPITAL_COMMUNITY): Payer: BLUE CROSS/BLUE SHIELD | Attending: Internal Medicine

## 2018-04-05 VITALS — BP 109/66 | HR 84 | Temp 99.0°F | Resp 18

## 2018-04-05 DIAGNOSIS — Z171 Estrogen receptor negative status [ER-]: Secondary | ICD-10-CM

## 2018-04-05 DIAGNOSIS — C50211 Malignant neoplasm of upper-inner quadrant of right female breast: Secondary | ICD-10-CM | POA: Insufficient documentation

## 2018-04-05 DIAGNOSIS — D701 Agranulocytosis secondary to cancer chemotherapy: Secondary | ICD-10-CM | POA: Insufficient documentation

## 2018-04-05 MED ORDER — PEGFILGRASTIM-CBQV 6 MG/0.6ML ~~LOC~~ SOSY
PREFILLED_SYRINGE | SUBCUTANEOUS | Status: AC
  Filled 2018-04-05: qty 0.6

## 2018-04-05 MED ORDER — PEGFILGRASTIM-CBQV 6 MG/0.6ML ~~LOC~~ SOSY
6.0000 mg | PREFILLED_SYRINGE | Freq: Once | SUBCUTANEOUS | Status: AC
  Administered 2018-04-05: 6 mg via SUBCUTANEOUS

## 2018-04-05 NOTE — Progress Notes (Signed)
[name] presents today for injection per the provider's orders.  Udenyca administration without incident; see MAR for injection details.  Patient tolerated procedure well and without incident.  No questions or complaints noted at this time.  Discharged ambulatory.

## 2018-04-16 DIAGNOSIS — Z3689 Encounter for other specified antenatal screening: Secondary | ICD-10-CM | POA: Diagnosis not present

## 2018-04-16 DIAGNOSIS — Z113 Encounter for screening for infections with a predominantly sexual mode of transmission: Secondary | ICD-10-CM | POA: Diagnosis not present

## 2018-04-16 DIAGNOSIS — Z6825 Body mass index (BMI) 25.0-25.9, adult: Secondary | ICD-10-CM | POA: Diagnosis not present

## 2018-04-16 DIAGNOSIS — N898 Other specified noninflammatory disorders of vagina: Secondary | ICD-10-CM | POA: Diagnosis not present

## 2018-04-17 ENCOUNTER — Inpatient Hospital Stay (HOSPITAL_BASED_OUTPATIENT_CLINIC_OR_DEPARTMENT_OTHER): Payer: BLUE CROSS/BLUE SHIELD | Admitting: Adult Health

## 2018-04-17 ENCOUNTER — Other Ambulatory Visit: Payer: Self-pay | Admitting: Oncology

## 2018-04-17 ENCOUNTER — Inpatient Hospital Stay: Payer: BLUE CROSS/BLUE SHIELD

## 2018-04-17 ENCOUNTER — Encounter: Payer: Self-pay | Admitting: *Deleted

## 2018-04-17 ENCOUNTER — Inpatient Hospital Stay: Payer: BLUE CROSS/BLUE SHIELD | Admitting: Medical

## 2018-04-17 ENCOUNTER — Encounter: Payer: Self-pay | Admitting: Adult Health

## 2018-04-17 VITALS — BP 102/62 | HR 89 | Temp 98.7°F | Resp 17

## 2018-04-17 VITALS — BP 109/51 | HR 84 | Temp 99.1°F | Resp 18 | Ht 61.0 in | Wt 132.3 lb

## 2018-04-17 DIAGNOSIS — Z171 Estrogen receptor negative status [ER-]: Principal | ICD-10-CM

## 2018-04-17 DIAGNOSIS — C50211 Malignant neoplasm of upper-inner quadrant of right female breast: Secondary | ICD-10-CM | POA: Diagnosis not present

## 2018-04-17 DIAGNOSIS — T82848A Pain from vascular prosthetic devices, implants and grafts, initial encounter: Secondary | ICD-10-CM

## 2018-04-17 DIAGNOSIS — C50011 Malignant neoplasm of nipple and areola, right female breast: Secondary | ICD-10-CM

## 2018-04-17 DIAGNOSIS — T8090XA Unspecified complication following infusion and therapeutic injection, initial encounter: Secondary | ICD-10-CM

## 2018-04-17 DIAGNOSIS — Z5111 Encounter for antineoplastic chemotherapy: Secondary | ICD-10-CM | POA: Diagnosis not present

## 2018-04-17 LAB — CMP (CANCER CENTER ONLY)
ALK PHOS: 80 U/L (ref 40–150)
ALT: 31 U/L (ref 0–55)
ANION GAP: 10 (ref 3–11)
AST: 21 U/L (ref 5–34)
Albumin: 4.1 g/dL (ref 3.5–5.0)
BUN: 8 mg/dL (ref 7–26)
CALCIUM: 9.7 mg/dL (ref 8.4–10.4)
CO2: 25 mmol/L (ref 22–29)
Chloride: 105 mmol/L (ref 98–109)
Creatinine: 0.6 mg/dL (ref 0.60–1.10)
GFR, Estimated: >60 mL/min (ref >60)
Glucose, Bld: 98 mg/dL (ref 70–140)
POTASSIUM: 3.7 mmol/L (ref 3.5–5.1)
SODIUM: 140 mmol/L (ref 136–145)
Total Bilirubin: 0.3 mg/dL (ref 0.2–1.2)
Total Protein: 6.8 g/dL (ref 6.4–8.3)

## 2018-04-17 LAB — CBC WITH DIFFERENTIAL (CANCER CENTER ONLY)
BASOS PCT: 0 %
Basophils Absolute: 0 10*3/uL (ref 0.0–0.1)
Eosinophils Absolute: 0 10*3/uL (ref 0.0–0.5)
Eosinophils Relative: 0 %
HEMATOCRIT: 30.4 % — AB (ref 34.8–46.6)
HEMOGLOBIN: 10.5 g/dL — AB (ref 11.6–15.9)
LYMPHS PCT: 15 %
Lymphs Abs: 1.3 10*3/uL (ref 0.9–3.3)
MCH: 31 pg (ref 25.1–34.0)
MCHC: 34.6 g/dL (ref 31.5–36.0)
MCV: 89.5 fL (ref 79.5–101.0)
MONOS PCT: 6 %
Monocytes Absolute: 0.5 10*3/uL (ref 0.1–0.9)
NEUTROS ABS: 6.9 10*3/uL — AB (ref 1.5–6.5)
NEUTROS PCT: 79 %
Platelet Count: 183 10*3/uL (ref 145–400)
RBC: 3.4 MIL/uL — ABNORMAL LOW (ref 3.70–5.45)
RDW: 16.8 % — ABNORMAL HIGH (ref 11.2–14.5)
WBC Count: 8.7 10*3/uL (ref 3.9–10.3)

## 2018-04-17 MED ORDER — FAMOTIDINE IN NACL 20-0.9 MG/50ML-% IV SOLN
20.0000 mg | Freq: Once | INTRAVENOUS | Status: AC
  Administered 2018-04-17: 20 mg via INTRAVENOUS

## 2018-04-17 MED ORDER — SODIUM CHLORIDE 0.9% FLUSH
10.0000 mL | INTRAVENOUS | Status: DC | PRN
Start: 2018-04-17 — End: 2018-04-17
  Administered 2018-04-17: 10 mL
  Filled 2018-04-17: qty 10

## 2018-04-17 MED ORDER — SODIUM CHLORIDE 0.9 % IV SOLN
80.0000 mg/m2 | Freq: Once | INTRAVENOUS | Status: AC
  Administered 2018-04-17: 126 mg via INTRAVENOUS
  Filled 2018-04-17: qty 21

## 2018-04-17 MED ORDER — HEPARIN SOD (PORK) LOCK FLUSH 100 UNIT/ML IV SOLN
500.0000 [IU] | Freq: Once | INTRAVENOUS | Status: AC | PRN
  Administered 2018-04-17: 500 [IU]
  Filled 2018-04-17: qty 5

## 2018-04-17 MED ORDER — GABAPENTIN 300 MG PO CAPS: 300.0000 mg | ORAL_CAPSULE | Freq: Three times a day (TID) | ORAL | 0 refills | Status: DC

## 2018-04-17 MED ORDER — PALONOSETRON HCL INJECTION 0.25 MG/5ML
0.2500 mg | Freq: Once | INTRAVENOUS | Status: AC
  Administered 2018-04-17: 0.25 mg via INTRAVENOUS

## 2018-04-17 MED ORDER — DIPHENHYDRAMINE HCL 50 MG/ML IJ SOLN
50.0000 mg | Freq: Once | INTRAMUSCULAR | Status: AC
  Administered 2018-04-17: 50 mg via INTRAVENOUS

## 2018-04-17 MED ORDER — FOSAPREPITANT DIMEGLUMINE INJECTION 150 MG
Freq: Once | INTRAVENOUS | Status: AC
  Administered 2018-04-17: 13:00:00 via INTRAVENOUS
  Filled 2018-04-17: qty 5

## 2018-04-17 MED ORDER — FAMOTIDINE IN NACL 20-0.9 MG/50ML-% IV SOLN
INTRAVENOUS | Status: AC
  Filled 2018-04-17: qty 50

## 2018-04-17 MED ORDER — LORAZEPAM 2 MG/ML IJ SOLN: 0.5000 mg | INTRAMUSCULAR | Status: DC | PRN

## 2018-04-17 MED ORDER — SODIUM CHLORIDE 0.9% FLUSH
10.0000 mL | Freq: Once | INTRAVENOUS | Status: AC
  Administered 2018-04-17: 10 mL
  Filled 2018-04-17: qty 10

## 2018-04-17 MED ORDER — PALONOSETRON HCL INJECTION 0.25 MG/5ML
INTRAVENOUS | Status: AC
  Filled 2018-04-17: qty 5

## 2018-04-17 MED ORDER — SODIUM CHLORIDE 0.9 % IV SOLN
Freq: Once | INTRAVENOUS | Status: AC
  Administered 2018-04-17: 12:00:00 via INTRAVENOUS

## 2018-04-17 MED ORDER — SODIUM CHLORIDE 0.9 % IV SOLN
688.2000 mg | Freq: Once | INTRAVENOUS | Status: AC
  Administered 2018-04-17: 690 mg via INTRAVENOUS
  Filled 2018-04-17: qty 69

## 2018-04-17 MED ORDER — DIPHENHYDRAMINE HCL 50 MG/ML IJ SOLN
INTRAMUSCULAR | Status: AC
  Filled 2018-04-17: qty 1

## 2018-04-17 MED ORDER — FAMOTIDINE IN NACL 20-0.9 MG/50ML-% IV SOLN
20.0000 mg | Freq: Once | INTRAVENOUS | Status: AC | PRN
  Administered 2018-04-17: 20 mg via INTRAVENOUS

## 2018-04-17 NOTE — Patient Instructions (Addendum)
[facility] Discharge Instructions for Patients Receiving Chemotherapy  Today you received the following chemotherapy agents Taxol and Carboplatin.   To help prevent nausea and vomiting after your treatment, we encourage you to take your nausea medication as directed.   If you develop nausea and vomiting that is not controlled by your nausea medication, call the clinic.   BELOW ARE SYMPTOMS THAT SHOULD BE REPORTED IMMEDIATELY:  *FEVER GREATER THAN 100.5 F  *CHILLS WITH OR WITHOUT FEVER  NAUSEA AND VOMITING THAT IS NOT CONTROLLED WITH YOUR NAUSEA MEDICATION  *UNUSUAL SHORTNESS OF BREATH  *UNUSUAL BRUISING OR BLEEDING  TENDERNESS IN MOUTH AND THROAT WITH OR WITHOUT PRESENCE OF ULCERS  *URINARY PROBLEMS  *BOWEL PROBLEMS  UNUSUAL RASH Items with * indicate a potential emergency and should be followed up as soon as possible.  Feel free to call the clinic should you have any questions or concerns. The clinic phone number is [phone].  Please show the CHEMO ALERT CARD at check-in to the Emergency Department and triage nurse.   Paclitaxel injection What is this medicine? PACLITAXEL (PAK li TAX el) is a chemotherapy drug. It targets fast dividing cells, like cancer cells, and causes these cells to die. This medicine is used to treat ovarian cancer, breast cancer, and other cancers. This medicine may be used for other purposes; ask your health care provider or pharmacist if you have questions. COMMON BRAND NAME(S): Onxol, Taxol What should I tell my health care provider before I take this medicine? They need to know if you have any of these conditions: -blood disorders -irregular heartbeat -infection (especially a virus infection such as chickenpox, cold sores, or herpes) -liver disease -previous or ongoing radiation therapy -an unusual or allergic reaction to paclitaxel, alcohol, polyoxyethylated castor oil, other chemotherapy agents, other medicines, foods,  dyes, or preservatives -pregnant or trying to get pregnant -breast-feeding How should I use this medicine? This drug is given as an infusion into a vein. It is administered in a hospital or clinic by a specially trained health care professional. Talk to your pediatrician regarding the use of this medicine in children. Special care may be needed. Overdosage: If you think you have taken too much of this medicine contact a poison control center or emergency room at once. NOTE: This medicine is only for you. Do not share this medicine with others. What if I miss a dose? It is important not to miss your dose. Call your doctor or health care professional if you are unable to keep an appointment. What may interact with this medicine? Do not take this medicine with any of the following medications: -disulfiram -metronidazole This medicine may also interact with the following medications: -cyclosporine -diazepam -ketoconazole -medicines to increase blood counts like filgrastim, pegfilgrastim, sargramostim -other chemotherapy drugs like cisplatin, doxorubicin, epirubicin, etoposide, teniposide, vincristine -quinidine -testosterone -vaccines -verapamil Talk to your doctor or health care professional before taking any of these medicines: -acetaminophen -aspirin -ibuprofen -ketoprofen -naproxen This list may not describe all possible interactions. Give your health care provider a list of all the medicines, herbs, non-prescription drugs, or dietary supplements you use. Also tell them if you smoke, drink alcohol, or use illegal drugs. Some items may interact with your medicine. What should I watch for while using this medicine? Your condition will be monitored carefully while you are receiving this medicine. You will need important blood work done while you are taking this medicine. This medicine can cause serious allergic reactions. To reduce your risk you   will need to take other medicine(s)  before treatment with this medicine. If you experience allergic reactions like skin rash, itching or hives, swelling of the face, lips, or tongue, tell your doctor or health care professional right away. In some cases, you may be given additional medicines to help with side effects. Follow all directions for their use. This drug may make you feel generally unwell. This is not uncommon, as chemotherapy can affect healthy cells as well as cancer cells. Report any side effects. Continue your course of treatment even though you feel ill unless your doctor tells you to stop. Call your doctor or health care professional for advice if you get a fever, chills or sore throat, or other symptoms of a cold or flu. Do not treat yourself. This drug decreases your body's ability to fight infections. Try to avoid being around people who are sick. This medicine may increase your risk to bruise or bleed. Call your doctor or health care professional if you notice any unusual bleeding. Be careful brushing and flossing your teeth or using a toothpick because you may get an infection or bleed more easily. If you have any dental work done, tell your dentist you are receiving this medicine. Avoid taking products that contain aspirin, acetaminophen, ibuprofen, naproxen, or ketoprofen unless instructed by your doctor. These medicines may hide a fever. Do not become pregnant while taking this medicine. Women should inform their doctor if they wish to become pregnant or think they might be pregnant. There is a potential for serious side effects to an unborn child. Talk to your health care professional or pharmacist for more information. Do not breast-feed an infant while taking this medicine. Men are advised not to father a child while receiving this medicine. This product may contain alcohol. Ask your pharmacist or healthcare provider if this medicine contains alcohol. Be sure to tell all healthcare providers you are taking this  medicine. Certain medicines, like metronidazole and disulfiram, can cause an unpleasant reaction when taken with alcohol. The reaction includes flushing, headache, nausea, vomiting, sweating, and increased thirst. The reaction can last from 30 minutes to several hours. What side effects may I notice from receiving this medicine? Side effects that you should report to your doctor or health care professional as soon as possible: -allergic reactions like skin rash, itching or hives, swelling of the face, lips, or tongue -low blood counts - This drug may decrease the number of white blood cells, red blood cells and platelets. You may be at increased risk for infections and bleeding. -signs of infection - fever or chills, cough, sore throat, pain or difficulty passing urine -signs of decreased platelets or bleeding - bruising, pinpoint red spots on the skin, black, tarry stools, nosebleeds -signs of decreased red blood cells - unusually weak or tired, fainting spells, lightheadedness -breathing problems -chest pain -high or low blood pressure -mouth sores -nausea and vomiting -pain, swelling, redness or irritation at the injection site -pain, tingling, numbness in the hands or feet -slow or irregular heartbeat -swelling of the ankle, feet, hands Side effects that usually do not require medical attention (report to your doctor or health care professional if they continue or are bothersome): -bone pain -complete hair loss including hair on your head, underarms, pubic hair, eyebrows, and eyelashes -changes in the color of fingernails -diarrhea -loosening of the fingernails -loss of appetite -muscle or joint pain -red flush to skin -sweating This list may not describe all possible side effects. Call your doctor for   medical advice about side effects. You may report side effects to FDA at 1-800-FDA-1088. Where should I keep my medicine? This drug is given in a hospital or clinic and will not be  stored at home. NOTE: This sheet is a summary. It may not cover all possible information. If you have questions about this medicine, talk to your doctor, pharmacist, or health care provider.  2018 [employer] Standard (2015-10-12 19:58:00)   Carboplatin injection What is this medicine? CARBOPLATIN (KAR boe pla tin) is a chemotherapy drug. It targets fast dividing cells, like cancer cells, and causes these cells to die. This medicine is used to treat ovarian cancer and many other cancers. This medicine may be used for other purposes; ask your health care provider or pharmacist if you have questions. COMMON BRAND NAME(S): Paraplatin What should I tell my health care provider before I take this medicine? They need to know if you have any of these conditions: -blood disorders -hearing problems -kidney disease -recent or ongoing radiation therapy -an unusual or allergic reaction to carboplatin, cisplatin, other chemotherapy, other medicines, foods, dyes, or preservatives -pregnant or trying to get pregnant -breast-feeding How should I use this medicine? This drug is usually given as an infusion into a vein. It is administered in a hospital or clinic by a specially trained health care professional. Talk to your pediatrician regarding the use of this medicine in children. Special care may be needed. Overdosage: If you think you have taken too much of this medicine contact a poison control center or emergency room at once. NOTE: This medicine is only for you. Do not share this medicine with others. What if I miss a dose? It is important not to miss a dose. Call your doctor or health care professional if you are unable to keep an appointment. What may interact with this medicine? -medicines for seizures -medicines to increase blood counts like filgrastim, pegfilgrastim, sargramostim -some antibiotics like amikacin, gentamicin, neomycin, streptomycin, tobramycin -vaccines Talk to your doctor  or health care professional before taking any of these medicines: -acetaminophen -aspirin -ibuprofen -ketoprofen -naproxen This list may not describe all possible interactions. Give your health care provider a list of all the medicines, herbs, non-prescription drugs, or dietary supplements you use. Also tell them if you smoke, drink alcohol, or use illegal drugs. Some items may interact with your medicine. What should I watch for while using this medicine? Your condition will be monitored carefully while you are receiving this medicine. You will need important blood work done while you are taking this medicine. This drug may make you feel generally unwell. This is not uncommon, as chemotherapy can affect healthy cells as well as cancer cells. Report any side effects. Continue your course of treatment even though you feel ill unless your doctor tells you to stop. In some cases, you may be given additional medicines to help with side effects. Follow all directions for their use. Call your doctor or health care professional for advice if you get a fever, chills or sore throat, or other symptoms of a cold or flu. Do not treat yourself. This drug decreases your body's ability to fight infections. Try to avoid being around people who are sick. This medicine may increase your risk to bruise or bleed. Call your doctor or health care professional if you notice any unusual bleeding. Be careful brushing and flossing your teeth or using a toothpick because you may get an infection or bleed more easily. If you have any dental work done,   tell your dentist you are receiving this medicine. Avoid taking products that contain aspirin, acetaminophen, ibuprofen, naproxen, or ketoprofen unless instructed by your doctor. These medicines may hide a fever. Do not become pregnant while taking this medicine. Women should inform their doctor if they wish to become pregnant or think they might be pregnant. There is a potential  for serious side effects to an unborn child. Talk to your health care professional or pharmacist for more information. Do not breast-feed an infant while taking this medicine. What side effects may I notice from receiving this medicine? Side effects that you should report to your doctor or health care professional as soon as possible: -allergic reactions like skin rash, itching or hives, swelling of the face, lips, or tongue -signs of infection - fever or chills, cough, sore throat, pain or difficulty passing urine -signs of decreased platelets or bleeding - bruising, pinpoint red spots on the skin, black, tarry stools, nosebleeds -signs of decreased red blood cells - unusually weak or tired, fainting spells, lightheadedness -breathing problems -changes in hearing -changes in vision -chest pain -high blood pressure -low blood counts - This drug may decrease the number of white blood cells, red blood cells and platelets. You may be at increased risk for infections and bleeding. -nausea and vomiting -pain, swelling, redness or irritation at the injection site -pain, tingling, numbness in the hands or feet -problems with balance, talking, walking -trouble passing urine or change in the amount of urine Side effects that usually do not require medical attention (report to your doctor or health care professional if they continue or are bothersome): -hair loss -loss of appetite -metallic taste in the mouth or changes in taste This list may not describe all possible side effects. Call your doctor for medical advice about side effects. You may report side effects to FDA at 1-800-FDA-1088. Where should I keep my medicine? This drug is given in a hospital or clinic and will not be stored at home. NOTE: This sheet is a summary. It may not cover all possible information. If you have questions about this medicine, talk to your doctor, pharmacist, or health care provider.  2018 [employer]/Gold Standard  (2008-03-17 14:38:05)  

## 2018-04-17 NOTE — Progress Notes (Signed)
Pt reported feeling "a little nauseous" after taxol infusion completion. Order obtained for ativan per Dr. Jana Hakim. Per pt, she prefers to wait until after carboplatin, denies ativan at this time.

## 2018-04-17 NOTE — Progress Notes (Signed)
During paclitaxel infusion, patient began c/o bilateral arm "heaviness" and "my hands feel like they're swelling." No obvious swelling noted. Patient denies CP, SOB, and nausea. Infusion paused. Sandi Mealy, PA-C came to treatment area to evaluate. Medications ordered and administered as documented in [facility].

## 2018-04-17 NOTE — Patient Instructions (Signed)
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
## 2018-04-17 NOTE — Assessment & Plan Note (Signed)
02/01/2018:Screening detected right breast mass in the upper inner quadrant measuring 1.5 cm, with additional enhancement it measured 2.2 cm.  No axillary lymph nodes.  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  Genetic counseling and testing 2. neoadjuvant chemotherapy with dose dense Adriamycin and Cytoxan every 2 weeks x4 followed by Taxol weekly x12  accompanied by carboplatin every 3 weeks x4 3. Followed by surgical options dependent on whether she has genetic mutation. 4.  Adjuvant radiation if she undergoes breast conserving surgery CT CAP and bone scan: Negative for metastases ---------------------------------------------------------------------------------------------------------------------------------  Current treatment: Cycle 1 of Taxol/Carbo (Carbo given every 3 weeks with AUC of 6)  [name] is doing well today.  We reviewed the Taxol/Carbo.  Her CBC is stable, she will proceed with chemo today (so long as CMET is within parameters).  In regards to her port pain, I had Jamie Pel, RN call over to [name], [place] in the treatment room to request that they give her several flushes, and a small normal saline infusion to ensure the port is working correctly.  They will let us know if there are any concerns.    [name] will return every other week for a provider appt, she will continue to receive Taxol weekly, and Carboplatin every 3 weeks.

## 2018-04-18 ENCOUNTER — Telehealth: Payer: Self-pay | Admitting: Adult Health

## 2018-04-18 NOTE — Telephone Encounter (Signed)
Per 4/24 no los °

## 2018-04-19 NOTE — Progress Notes (Signed)
   DATE: 04/17/2018      X CHEMO/IMMUNOTHERAPY REACTION          MD: Rogelia Rohrer   AGENT/BLOOD PRODUCT RECEIVING TODAY:   Carboplatin and paclitaxel  AGENT/BLOOD PRODUCT RECEIVING IMMEDIATELY PRIOR TO REACTION: paclitaxel  VS: BP:     99/59 P:       92 SPO2:       97          BP:     113/62 P:       97 SPO2:       98          REACTION(S):     Heaviness and tingling of both hands  PREMEDS:  X Aloxi    X Benadryl (50 mg IV) X Dexamethasone (12 mg IV)    X Pepcid (20 mg)      INTERVENTION:  X Pepcid (20 mg)  x 1   Review of Systems  Constitutional: Negative for diaphoresis.  HENT: Negative for trouble swallowing.   Respiratory: Negative for cough, choking, chest tightness, shortness of breath and wheezing.   Cardiovascular: Negative for chest pain and palpitations.  Gastrointestinal: Negative for nausea and vomiting.  Musculoskeletal: Negative for back pain.  Skin: Negative for color change and rash.  Neurological: Negative for dizziness, light-headedness and headaches.       Heaviness and tingling of the hands    Physical Exam  Constitutional: No distress.  HENT:  Head: Normocephalic and atraumatic.  Cardiovascular: Normal rate, regular rhythm and normal heart sounds. Exam reveals no gallop and no friction rub.  No murmur heard. Pulmonary/Chest: Effort normal and breath sounds normal. No respiratory distress. She has no wheezes. She has no rales.  Neurological: She is alert.  Skin: Skin is warm and dry. No rash noted. She is not diaphoretic. No erythema.     OUTCOME: X Patient responded to intervention X Chemo/Immunotherapy restarted and completed   Sandi Mealy, MHS, PA-C

## 2018-04-24 ENCOUNTER — Other Ambulatory Visit: Payer: Self-pay

## 2018-04-24 ENCOUNTER — Telehealth: Payer: Self-pay

## 2018-04-24 MED ORDER — GENTAMICIN SULFATE 0.3 % OP SOLN: 2.0000 [drp] | OPHTHALMIC | 0 refills | Status: DC

## 2018-04-24 NOTE — Telephone Encounter (Signed)
Voicemail received from patient requesting a medication for an "eye infection"  Upon return call back. Patient disclosed that she's had, whet she believes to be, recurrent eye infections for a few weeks. Patient states she has been taking prescribed eye drops from a previous infection and they don't seem to be helping. Patient reports both eyes are red and dry on the outside and appear "gritty on the inside". Patient states she's also tried cool compresses, over-the-counter eye drops for dry eyes, and Claritin to no relief. Patient reports she's also been experiencing vaginal dryness and redness. Patient reports seeing her OBGYN and "everything was negative" she was told her issue is believed to be related to her sebaceous glands. She has not received results back from yeast infection; however, "I was taking medication for a yeast infection anyway and it did not help. I think the chemo is just causing a lot of other problems with my body."  RN consulted with Sandi Mealy, PA. Script sent to pharmacy for eye symptoms. Patient advised to ask infusion RN to make PA aware of patient's presence during infusion visit tomorrow so that he may visit with patient in infusion room. Patient verbalized understanding.

## 2018-04-25 ENCOUNTER — Inpatient Hospital Stay (HOSPITAL_BASED_OUTPATIENT_CLINIC_OR_DEPARTMENT_OTHER): Payer: BLUE CROSS/BLUE SHIELD | Admitting: Hematology and Oncology

## 2018-04-25 ENCOUNTER — Other Ambulatory Visit: Payer: BLUE CROSS/BLUE SHIELD

## 2018-04-25 ENCOUNTER — Inpatient Hospital Stay: Payer: BLUE CROSS/BLUE SHIELD

## 2018-04-25 ENCOUNTER — Inpatient Hospital Stay: Payer: BLUE CROSS/BLUE SHIELD | Attending: Genetic Counselor

## 2018-04-25 DIAGNOSIS — Z171 Estrogen receptor negative status [ER-]: Secondary | ICD-10-CM

## 2018-04-25 DIAGNOSIS — Z5111 Encounter for antineoplastic chemotherapy: Secondary | ICD-10-CM | POA: Diagnosis not present

## 2018-04-25 DIAGNOSIS — R21 Rash and other nonspecific skin eruption: Secondary | ICD-10-CM | POA: Diagnosis not present

## 2018-04-25 DIAGNOSIS — N898 Other specified noninflammatory disorders of vagina: Secondary | ICD-10-CM | POA: Diagnosis not present

## 2018-04-25 DIAGNOSIS — H5789 Other specified disorders of eye and adnexa: Secondary | ICD-10-CM | POA: Diagnosis not present

## 2018-04-25 DIAGNOSIS — C50211 Malignant neoplasm of upper-inner quadrant of right female breast: Secondary | ICD-10-CM | POA: Insufficient documentation

## 2018-04-25 DIAGNOSIS — R530 Neoplastic (malignant) related fatigue: Secondary | ICD-10-CM

## 2018-04-25 DIAGNOSIS — T148XXA Other injury of unspecified body region, initial encounter: Secondary | ICD-10-CM

## 2018-04-25 DIAGNOSIS — D709 Neutropenia, unspecified: Secondary | ICD-10-CM | POA: Diagnosis not present

## 2018-04-25 DIAGNOSIS — R51 Headache: Secondary | ICD-10-CM | POA: Insufficient documentation

## 2018-04-25 LAB — CMP (CANCER CENTER ONLY)
ALBUMIN: 4.3 g/dL (ref 3.5–5.0)
ALT: 62 U/L — ABNORMAL HIGH (ref 0–55)
AST: 46 U/L — AB (ref 5–34)
Alkaline Phosphatase: 57 U/L (ref 40–150)
Anion gap: 7 (ref 3–11)
BILIRUBIN TOTAL: 0.3 mg/dL (ref 0.2–1.2)
BUN: 7 mg/dL (ref 7–26)
CO2: 27 mmol/L (ref 22–29)
Calcium: 9.7 mg/dL (ref 8.4–10.4)
Chloride: 105 mmol/L (ref 98–109)
Creatinine: 0.59 mg/dL — ABNORMAL LOW (ref 0.60–1.10)
GFR, Est AFR Am: >60 mL/min (ref >60)
GFR, Estimated: >60 mL/min (ref >60)
GLUCOSE: 99 mg/dL (ref 70–140)
Potassium: 3.4 mmol/L — ABNORMAL LOW (ref 3.5–5.1)
SODIUM: 139 mmol/L (ref 136–145)
Total Protein: 6.8 g/dL (ref 6.4–8.3)

## 2018-04-25 LAB — CBC WITH DIFFERENTIAL (CANCER CENTER ONLY)
Basophils Absolute: 0 10*3/uL (ref 0.0–0.1)
Basophils Relative: 1 %
EOS PCT: 1 %
Eosinophils Absolute: 0 10*3/uL (ref 0.0–0.5)
HCT: 27.5 % — ABNORMAL LOW (ref 34.8–46.6)
Hemoglobin: 9.6 g/dL — ABNORMAL LOW (ref 11.6–15.9)
LYMPHS ABS: 0.8 10*3/uL — AB (ref 0.9–3.3)
Lymphocytes Relative: 24 %
MCH: 30.9 pg (ref 25.1–34.0)
MCHC: 34.9 g/dL (ref 31.5–36.0)
MCV: 88.4 fL (ref 79.5–101.0)
MONO ABS: 0.3 10*3/uL (ref 0.1–0.9)
MONOS PCT: 10 %
NEUTROS ABS: 2.2 10*3/uL (ref 1.5–6.5)
Neutrophils Relative %: 64 %
PLATELETS: 236 10*3/uL (ref 145–400)
RBC: 3.11 MIL/uL — ABNORMAL LOW (ref 3.70–5.45)
RDW: 16.2 % — ABNORMAL HIGH (ref 11.2–14.5)
WBC Count: 3.3 10*3/uL — ABNORMAL LOW (ref 3.9–10.3)

## 2018-04-25 MED ORDER — METRONIDAZOLE 1 % EX GEL: Freq: Every day | CUTANEOUS | 1 refills | Status: DC

## 2018-04-25 MED ORDER — ACETAMINOPHEN 325 MG PO TABS
650.0000 mg | ORAL_TABLET | Freq: Once | ORAL | Status: AC
  Administered 2018-04-25: 650 mg via ORAL

## 2018-04-25 MED ORDER — SODIUM CHLORIDE 0.9% FLUSH
10.0000 mL | INTRAVENOUS | Status: DC | PRN
  Administered 2018-04-25: 10 mL
  Filled 2018-04-25: qty 10

## 2018-04-25 MED ORDER — FAMOTIDINE IN NACL 20-0.9 MG/50ML-% IV SOLN
INTRAVENOUS | Status: AC
  Filled 2018-04-25: qty 50

## 2018-04-25 MED ORDER — DIPHENHYDRAMINE HCL 50 MG/ML IJ SOLN
50.0000 mg | Freq: Once | INTRAMUSCULAR | Status: AC
  Administered 2018-04-25: 50 mg via INTRAVENOUS

## 2018-04-25 MED ORDER — ACETAMINOPHEN 325 MG PO TABS
ORAL_TABLET | ORAL | Status: AC
  Filled 2018-04-25: qty 2

## 2018-04-25 MED ORDER — FAMOTIDINE IN NACL 20-0.9 MG/50ML-% IV SOLN
20.0000 mg | Freq: Once | INTRAVENOUS | Status: AC
  Administered 2018-04-25: 20 mg via INTRAVENOUS

## 2018-04-25 MED ORDER — HEPARIN SOD (PORK) LOCK FLUSH 100 UNIT/ML IV SOLN
500.0000 [IU] | Freq: Once | INTRAVENOUS | Status: AC | PRN
  Administered 2018-04-25: 500 [IU]
  Filled 2018-04-25: qty 5

## 2018-04-25 MED ORDER — SODIUM CHLORIDE 0.9 % IV SOLN
Freq: Once | INTRAVENOUS | Status: AC
  Administered 2018-04-25: 11:00:00 via INTRAVENOUS

## 2018-04-25 MED ORDER — SODIUM CHLORIDE 0.9 % IV SOLN
20.0000 mg | Freq: Once | INTRAVENOUS | Status: AC
  Administered 2018-04-25: 20 mg via INTRAVENOUS
  Filled 2018-04-25: qty 2

## 2018-04-25 MED ORDER — DIPHENHYDRAMINE HCL 50 MG/ML IJ SOLN
INTRAMUSCULAR | Status: AC
  Filled 2018-04-25: qty 1

## 2018-04-25 MED ORDER — SODIUM CHLORIDE 0.9 % IV SOLN
65.0000 mg/m2 | Freq: Once | INTRAVENOUS | Status: AC
  Administered 2018-04-25: 102 mg via INTRAVENOUS
  Filled 2018-04-25: qty 17

## 2018-04-25 MED ORDER — DEXAMETHASONE SODIUM PHOSPHATE 0.1 % OP SOLN: 2.0000 [drp] | OPHTHALMIC | 3 refills | Status: DC

## 2018-04-25 MED ORDER — FLUCONAZOLE 100 MG PO TABS: 100.0000 mg | ORAL_TABLET | Freq: Every day | ORAL | 2 refills | Status: DC

## 2018-04-25 NOTE — Progress Notes (Signed)
Patient Care Team: [name], MD as PCP - General (Cardiology)  DIAGNOSIS:  Encounter Diagnosis  Name Primary?  . Malignant neoplasm of upper-inner quadrant of right breast in female, estrogen receptor negative ([place])     SUMMARY OF ONCOLOGIC HISTORY:   Malignant neoplasm of upper-inner quadrant of right breast in female, estrogen receptor negative ([place])   02/01/2018 Initial Diagnosis    Screening detected right breast mass in the upper inner quadrant measuring 1.5 cm, with additional enhancement it measured 2.2 cm.  No axillary lymph nodes.  Biopsy revealed IDC with DCIS, grade 3, ER 0%, PR 0%, HER-2 negative, Ki-67 80%, T1CN0 stage Ib clinical stage      02/20/2018 Genetic Testing    BRCA1 c.2722G>T pathogenic mutation and MLH1 c.979C>G and RAD51C c.506T>C VUS identified on the common hereditary cancer panel.  The Hereditary Gene Panel offered by [facility] includes sequencing and/or deletion duplication testing of the following 47 genes: APC, ATM, AXIN2, BARD1, BMPR1A, BRCA1, BRCA2, BRIP1, CDH1, CDK4, CDKN2A (p14ARF), CDKN2A (p16INK4a), CHEK2, CTNNA1, DICER1, EPCAM (Deletion/duplication testing only), GREM1 (promoter region deletion/duplication testing only), KIT, MEN1, MLH1, MSH2, MSH3, MSH6, MUTYH, NBN, NF1, NHTL1, PALB2, PDGFRA, PMS2, POLD1, POLE, PTEN, RAD50, RAD51C, RAD51D, SDHB, SDHC, SDHD, SMAD4, SMARCA4. STK11, TP53, TSC1, TSC2, and VHL.  The following genes were evaluated for sequence changes only: SDHA and HOXB13 c.251G>A variant only. The report date is February 20, 2018.       02/20/2018 -  Neo-Adjuvant Chemotherapy    Neoadjuvant dose dense Adriamycin and Cytoxan x4 followed by Taxol and carboplatin       CHIEF COMPLIANT: Cycle 2 Taxol  INTERVAL HISTORY: [name] is a 32 year old with above-mentioned history of triple negative right breast cancer who is currently on neoadjuvant chemotherapy.  She completed 4 cycles of dose dense Adriamycin and Cytoxan and she is  here today to receive her second cycle of Taxol.  She received Taxol and carboplatin last week.  She developed redness itching and watering of her eyes after the last cycle of chemo.  She was given antibiotic eyedrops which helped her slightly.  But she continues to have the redness in the eyes.  She was also having bone pain after the last cycle of chemo.  She did develop some slight chest tightness during the infusion which got better with supportive care.  Denies any nausea vomiting.  Denies any fevers or chills.  REVIEW OF SYSTEMS:   Constitutional: Denies fevers, chills or abnormal weight loss Eyes: Redness bilateral eyes Ears, nose, mouth, throat, and face: Denies mucositis or sore throat Respiratory: Denies cough, dyspnea or wheezes Cardiovascular: Denies palpitation, chest discomfort Gastrointestinal:  Denies nausea, heartburn or change in bowel habits Skin: Denies abnormal skin rashes Lymphatics: Denies new lymphadenopathy or easy bruising Neurological:Denies numbness, tingling or new weaknesses Behavioral/Psych: Mood is stable, no new changes  Extremities: No lower extremity edema  All other systems were reviewed with the patient and are negative.  I have reviewed the past medical history, past surgical history, social history and family history with the patient and they are unchanged from previous note.  ALLERGIES:  is allergic to lidocaine.  MEDICATIONS:  Current Outpatient Medications  Medication Sig Dispense Refill  . dexamethasone (DECADRON) 0.1 % ophthalmic solution Place 2 drops into both eyes every 4 (four) hours. 5 mL 3  . dexamethasone (DECADRON) 4 MG tablet Take 4 mg by mouth daily after breakfast.    . fluconazole (DIFLUCAN) 100 MG tablet Take 1 tablet (100  mg total) by mouth daily. 7 tablet 2  . gabapentin (NEURONTIN) 300 MG capsule Take 1 capsule (300 mg total) by mouth 3 (three) times daily. 30 capsule 0  . gentamicin (GARAMYCIN) 0.3 % ophthalmic solution Place 2  drops into both eyes every 4 (four) hours. Continue drops for 3 days after symptoms improve. 5 mL 0  . ibuprofen (ADVIL,MOTRIN) 600 MG tablet Take 1 tablet (600 mg total) by mouth 3 (three) times daily. 30 tablet 0  . loratadine (CLARITIN) 10 MG tablet Take 1 tablet (10 mg total) by mouth daily. 30 tablet 0  . LORazepam (ATIVAN) 0.5 MG tablet Take 1 tablet (0.5 mg total) by mouth at bedtime. 30 tablet 1  . metroNIDAZOLE (METROGEL) 1 % gel Apply topically daily. 45 g 1  . ondansetron (ZOFRAN) 8 MG tablet Take 8 mg by mouth 2 (two) times daily as needed for nausea or vomiting.    . pantoprazole (PROTONIX) 40 MG tablet Take 1 tablet (40 mg total) by mouth daily. 30 tablet 3  . prochlorperazine (COMPAZINE) 10 MG tablet Take 10 mg by mouth every 6 (six) hours as needed for nausea or vomiting.    . promethazine (PHENERGAN) 25 MG tablet Take 0.5 tablets (12.5 mg total) by mouth every 6 (six) hours as needed for nausea or vomiting. (Patient not taking: Reported on 04/05/2018) 120 tablet 0   No current facility-administered medications for this visit.    Facility-Administered Medications Ordered in Other Visits  Medication Dose Route Frequency Provider Last Rate Last Dose  . sodium chloride flush (NS) 0.9 % injection 10 mL  10 mL Intracatheter PRN Magrinat, Virgie Dad, MD   10 mL at 04/25/18 1353    PHYSICAL EXAMINATION: ECOG PERFORMANCE STATUS: 1 - Symptomatic but completely ambulatory  Vitals:   04/25/18 1015  BP: 104/87  Pulse: 89  Resp: 18  Temp: 98.6 F (37 C)  SpO2: 100%   Filed Weights   04/25/18 1015  Weight: 132 lb 6.4 oz (60.1 kg)    GENERAL:alert, no distress and comfortable SKIN: skin color, texture, turgor are normal, no rashes or significant lesions EYES: Markedly erythematous conjunctiva OROPHARYNX:no exudate, no erythema and lips, buccal mucosa, and tongue normal  NECK: supple, thyroid normal size, non-tender, without nodularity LYMPH:  no palpable lymphadenopathy in the  cervical, axillary or inguinal LUNGS: clear to auscultation and percussion with normal breathing effort HEART: regular rate & rhythm and no murmurs and no lower extremity edema ABDOMEN:abdomen soft, non-tender and normal bowel sounds MUSCULOSKELETAL:no cyanosis of digits and no clubbing  NEURO: alert & oriented x 3 with fluent speech, no focal motor/sensory deficits EXTREMITIES: No lower extremity edema  LABORATORY DATA:  I have reviewed the data as listed CMP Latest Ref Rng & Units 04/25/2018 04/17/2018 04/03/2018  Glucose 70 - 140 mg/dL 99 98 97  BUN 7 - 26 mg/dL 7 8 7   Creatinine 0.60 - 1.10 mg/dL 0.59(L) 0.60 0.64  Sodium 136 - 145 mmol/L 139 140 139  Potassium 3.5 - 5.1 mmol/L 3.4(L) 3.7 3.6  Chloride 98 - 109 mmol/L 105 105 103  CO2 22 - 29 mmol/L 27 25 28   Calcium 8.4 - 10.4 mg/dL 9.7 9.7 9.9  Total Protein 6.4 - 8.3 g/dL 6.8 6.8 7.1  Total Bilirubin 0.2 - 1.2 mg/dL 0.3 0.3 0.3  Alkaline Phos 40 - 150 U/L 57 80 87  AST 5 - 34 U/L 46(H) 21 19  ALT 0 - 55 U/L 62(H) 31 27    Lab  Results  Component Value Date   WBC 3.3 (L) 04/25/2018   HGB 9.6 (L) 04/25/2018   HCT 27.5 (L) 04/25/2018   MCV 88.4 04/25/2018   PLT 236 04/25/2018   NEUTROABS 2.2 04/25/2018    ASSESSMENT & PLAN:  Malignant neoplasm of upper-inner quadrant of right breast in female, estrogen receptor negative ([place]) 02/01/2018:Screening detected right breast mass in the upper inner quadrant measuring 1.5 cm, with additional enhancement it measured 2.2 cm.  No axillary lymph nodes.  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  Genetic counseling and testing 2. neoadjuvant chemotherapy with dose dense Adriamycin and Cytoxan every 2 weeks x4 followed by Taxol weekly x12  accompanied by carboplatin every 3 weeks x4 3. Followed by surgical options dependent on whether she has genetic mutation. 4.  Adjuvant radiation if she undergoes  breast conserving surgery CT CAP and bone scan: Negative for metastases ---------------------------------------------------------------------------------------------------------------------------------  Current treatment:  Completed 4 cycles of Adriamycin and Cytoxan;  cycle 2 of Taxol along with Carboplatin (Carbo given every 3 weeks with AUC of 6)  Chemo toxicities: 1.  With the Taxol she developed slight chest tightness which got better with supportive care. 2. previous sinus infection: Previously treated with Augmentin 3. Fatigue due to chemotherapy  4.  Alopecia  5. Severe redness of the eyes: Most likely related to allergies.  I prescribed her dexamethasone eyedrops to be applied up to 4 times a day as needed.  6. Vaginal irritation and skin excoriation.  I prescribed her Diflucan orally as well as gave her a prescription for metronidazole gel to be applied to the skin around the vagina and vulva  I reduce the dosage of Taxol chemotherapy because of the profoundness of the side effects.   Return to clinic weekly for chemo on every other week to follow-up with [place]  No orders of the defined types were placed in this encounter.  The patient has a good understanding of the overall plan. she agrees with it. she will call with any problems that may develop before the next visit here.   Harriette Ohara, MD 04/25/18

## 2018-04-25 NOTE — Assessment & Plan Note (Signed)
02/01/2018:Screening detected right breast mass in the upper inner quadrant measuring 1.5 cm, with additional enhancement it measured 2.2 cm.  No axillary lymph nodes.  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  Genetic counseling and testing 2. neoadjuvant chemotherapy with dose dense Adriamycin and Cytoxan every 2 weeks x4 followed by Taxol weekly x12  accompanied by carboplatin every 3 weeks x4 3. Followed by surgical options dependent on whether she has genetic mutation. 4.  Adjuvant radiation if she undergoes breast conserving surgery CT CAP and bone scan: Negative for metastases ---------------------------------------------------------------------------------------------------------------------------------  Current treatment:  Completed 4 cycles of Adriamycin and Cytoxan;  cycle 2 of Taxol along with Carboplatin (Carbo given every 3 weeks with AUC of 6)  Chemo toxicities: 1.  With the Taxol she developed slight chest tightness which got better with supportive care. 2. previous sinus infection: Previously treated with Augmentin 3. Fatigue due to chemotherapy  4.  Alopecia   Return to clinic weekly for chemo on every other week to follow-up with [place]

## 2018-04-25 NOTE — Patient Instructions (Signed)

## 2018-04-25 NOTE — Patient Instructions (Signed)
[facility] Discharge Instructions for Patients Receiving Chemotherapy  Today you received the following chemotherapy agents:  Taxol.  To help prevent nausea and vomiting after your treatment, we encourage you to take your nausea medication as directed.   If you develop nausea and vomiting that is not controlled by your nausea medication, call the clinic.   BELOW ARE SYMPTOMS THAT SHOULD BE REPORTED IMMEDIATELY:  *FEVER GREATER THAN 100.5 F  *CHILLS WITH OR WITHOUT FEVER  NAUSEA AND VOMITING THAT IS NOT CONTROLLED WITH YOUR NAUSEA MEDICATION  *UNUSUAL SHORTNESS OF BREATH  *UNUSUAL BRUISING OR BLEEDING  TENDERNESS IN MOUTH AND THROAT WITH OR WITHOUT PRESENCE OF ULCERS  *URINARY PROBLEMS  *BOWEL PROBLEMS  UNUSUAL RASH Items with * indicate a potential emergency and should be followed up as soon as possible.  Feel free to call the clinic should you have any questions or concerns. The clinic phone number is [phone].  Please show the CHEMO ALERT CARD at check-in to the Emergency Department and triage nurse.   

## 2018-04-28 ENCOUNTER — Encounter (HOSPITAL_COMMUNITY): Payer: Self-pay | Admitting: Emergency Medicine

## 2018-04-28 ENCOUNTER — Emergency Department (HOSPITAL_COMMUNITY)
Admission: EM | Admit: 2018-04-28 | Discharge: 2018-04-28 | Disposition: A | Discharge status: Home / Self Care | Payer: BLUE CROSS/BLUE SHIELD | Attending: Emergency Medicine | Admitting: Emergency Medicine

## 2018-04-28 DIAGNOSIS — E0590 Thyrotoxicosis, unspecified without thyrotoxic crisis or storm: Secondary | ICD-10-CM | POA: Diagnosis not present

## 2018-04-28 DIAGNOSIS — R21 Rash and other nonspecific skin eruption: Secondary | ICD-10-CM | POA: Insufficient documentation

## 2018-04-28 DIAGNOSIS — T7840XA Allergy, unspecified, initial encounter: Secondary | ICD-10-CM | POA: Insufficient documentation

## 2018-04-28 LAB — COMPREHENSIVE METABOLIC PANEL
ALBUMIN: 4.4 g/dL (ref 3.5–5.0)
ALT: 57 U/L — ABNORMAL HIGH (ref 14–54)
ANION GAP: 9 (ref 5–15)
AST: 33 U/L (ref 15–41)
Alkaline Phosphatase: 51 U/L (ref 38–126)
BUN: 8 mg/dL (ref 6–20)
CHLORIDE: 107 mmol/L (ref 101–111)
CO2: 26 mmol/L (ref 22–32)
Calcium: 9.5 mg/dL (ref 8.9–10.3)
Creatinine, Ser: 0.51 mg/dL (ref 0.44–1.00)
GFR calc non Af Amer: >60 mL/min (ref >60)
Glucose, Bld: 99 mg/dL (ref 65–99)
POTASSIUM: 3.5 mmol/L (ref 3.5–5.1)
SODIUM: 142 mmol/L (ref 135–145)
Total Bilirubin: 0.3 mg/dL (ref 0.3–1.2)
Total Protein: 7.3 g/dL (ref 6.5–8.1)

## 2018-04-28 LAB — CBC WITH DIFFERENTIAL/PLATELET
Basophils Absolute: 0 10*3/uL (ref 0.0–0.1)
Basophils Relative: 0 %
EOS PCT: 0 %
Eosinophils Absolute: 0 10*3/uL (ref 0.0–0.7)
HEMATOCRIT: 29 % — AB (ref 36.0–46.0)
Hemoglobin: 9.9 g/dL — ABNORMAL LOW (ref 12.0–15.0)
LYMPHS ABS: 0.7 10*3/uL (ref 0.7–4.0)
LYMPHS PCT: 23 %
MCH: 31 pg (ref 26.0–34.0)
MCHC: 34.1 g/dL (ref 30.0–36.0)
MCV: 90.9 fL (ref 78.0–100.0)
MONO ABS: 0.2 10*3/uL (ref 0.1–1.0)
MONOS PCT: 8 %
NEUTROS ABS: 2.2 10*3/uL (ref 1.7–7.7)
NEUTROS PCT: 69 %
PLATELETS: 194 10*3/uL (ref 150–400)
RBC: 3.19 MIL/uL — ABNORMAL LOW (ref 3.87–5.11)
RDW: 15.9 % — AB (ref 11.5–15.5)
WBC: 3.1 10*3/uL — ABNORMAL LOW (ref 4.0–10.5)

## 2018-04-28 LAB — I-STAT BETA HCG BLOOD, ED (MC, WL, AP ONLY): I-stat hCG, quantitative: <5 m[IU]/mL (ref <5)

## 2018-04-28 MED ORDER — PREDNISONE 10 MG PO TABS: 30.0000 mg | ORAL_TABLET | Freq: Every day | ORAL | 0 refills | Status: AC

## 2018-04-28 MED ORDER — FAMOTIDINE 40 MG PO TABS: 40.0000 mg | ORAL_TABLET | Freq: Every day | ORAL | 0 refills | Status: DC

## 2018-04-28 MED ORDER — EPINEPHRINE 0.3 MG/0.3ML IJ SOAJ: 0.3000 mg | Freq: Once | INTRAMUSCULAR | 0 refills | Status: AC

## 2018-04-28 NOTE — ED Triage Notes (Signed)
Patient here from home with complaints of rash. Thinks that it may be due to an allergic reaction to the chemo. Rash to neck and chest, ears, and eyes. Denies SOB or throat swelling.
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
## 2018-04-28 NOTE — Discharge Instructions (Signed)

## 2018-04-29 ENCOUNTER — Telehealth: Payer: Self-pay

## 2018-04-29 NOTE — Telephone Encounter (Signed)
Called to check how patient is doing with her rash, post ED visit yesterday. Pt reports some improvement, but still has issues with her eyes swelling and itching. Pt was prescribed eye drops,as well medrol dose pack for the rash. Pt states that she has never had seasonal allergies before chemo started, but noticed a big change after receiving chemo. She is fine when she spends time indoors, but the moment she steps outside, her eyes starts to itch and swell. Pt denies any itching on her rash (chest and neck). No open areas noted and denies fevers/chills. Pt coming in for next treatment 05/01/18. Notified Lindsey,NP and is aware.

## 2018-05-01 ENCOUNTER — Inpatient Hospital Stay: Payer: BLUE CROSS/BLUE SHIELD

## 2018-05-01 ENCOUNTER — Inpatient Hospital Stay (HOSPITAL_BASED_OUTPATIENT_CLINIC_OR_DEPARTMENT_OTHER): Payer: BLUE CROSS/BLUE SHIELD | Admitting: Adult Health

## 2018-05-01 VITALS — BP 108/72 | HR 84 | Temp 99.1°F | Resp 18 | Ht 61.0 in | Wt 130.3 lb

## 2018-05-01 DIAGNOSIS — C50211 Malignant neoplasm of upper-inner quadrant of right female breast: Secondary | ICD-10-CM

## 2018-05-01 DIAGNOSIS — C50011 Malignant neoplasm of nipple and areola, right female breast: Secondary | ICD-10-CM

## 2018-05-01 DIAGNOSIS — Z171 Estrogen receptor negative status [ER-]: Secondary | ICD-10-CM | POA: Diagnosis not present

## 2018-05-01 DIAGNOSIS — R21 Rash and other nonspecific skin eruption: Secondary | ICD-10-CM | POA: Diagnosis not present

## 2018-05-01 DIAGNOSIS — D709 Neutropenia, unspecified: Secondary | ICD-10-CM | POA: Diagnosis not present

## 2018-05-01 DIAGNOSIS — R51 Headache: Secondary | ICD-10-CM | POA: Diagnosis not present

## 2018-05-01 DIAGNOSIS — Z5111 Encounter for antineoplastic chemotherapy: Secondary | ICD-10-CM | POA: Diagnosis not present

## 2018-05-01 LAB — CMP (CANCER CENTER ONLY)
ALBUMIN: 4.3 g/dL (ref 3.5–5.0)
ALT: 41 U/L (ref 0–55)
AST: 22 U/L (ref 5–34)
Alkaline Phosphatase: 59 U/L (ref 40–150)
Anion gap: 5 (ref 3–11)
BUN: 12 mg/dL (ref 7–26)
CHLORIDE: 105 mmol/L (ref 98–109)
CO2: 29 mmol/L (ref 22–29)
Calcium: 9.6 mg/dL (ref 8.4–10.4)
Creatinine: 0.64 mg/dL (ref 0.60–1.10)
GFR, Est AFR Am: >60 mL/min (ref >60)
Glucose, Bld: 97 mg/dL (ref 70–140)
POTASSIUM: 3 mmol/L — AB (ref 3.5–5.1)
Sodium: 139 mmol/L (ref 136–145)
Total Bilirubin: 0.4 mg/dL (ref 0.2–1.2)
Total Protein: 6.9 g/dL (ref 6.4–8.3)

## 2018-05-01 LAB — CBC WITH DIFFERENTIAL (CANCER CENTER ONLY)
Basophils Absolute: 0 10*3/uL (ref 0.0–0.1)
Basophils Relative: 1 %
EOS PCT: 1 %
Eosinophils Absolute: 0 10*3/uL (ref 0.0–0.5)
HEMATOCRIT: 29.5 % — AB (ref 34.8–46.6)
HEMOGLOBIN: 10.2 g/dL — AB (ref 11.6–15.9)
LYMPHS ABS: 1.3 10*3/uL (ref 0.9–3.3)
LYMPHS PCT: 20 %
MCH: 31.5 pg (ref 25.1–34.0)
MCHC: 34.6 g/dL (ref 31.5–36.0)
MCV: 91 fL (ref 79.5–101.0)
Monocytes Absolute: 0.5 10*3/uL (ref 0.1–0.9)
Monocytes Relative: 8 %
NEUTROS ABS: 4.5 10*3/uL (ref 1.5–6.5)
Neutrophils Relative %: 70 %
PLATELETS: 188 10*3/uL (ref 145–400)
RBC: 3.24 MIL/uL — AB (ref 3.70–5.45)
RDW: 18.5 % — ABNORMAL HIGH (ref 11.2–14.5)
WBC: 6.4 10*3/uL (ref 3.9–10.3)

## 2018-05-01 MED ORDER — FAMOTIDINE IN NACL 20-0.9 MG/50ML-% IV SOLN
INTRAVENOUS | Status: AC
  Filled 2018-05-01: qty 50

## 2018-05-01 MED ORDER — SODIUM CHLORIDE 0.9% FLUSH
10.0000 mL | Freq: Once | INTRAVENOUS | Status: AC
  Administered 2018-05-01: 10 mL
  Filled 2018-05-01: qty 10

## 2018-05-01 MED ORDER — SODIUM CHLORIDE 0.9 % IV SOLN
Freq: Once | INTRAVENOUS | Status: AC
  Administered 2018-05-01: 14:00:00 via INTRAVENOUS
  Filled 2018-05-01: qty 500

## 2018-05-01 MED ORDER — SODIUM CHLORIDE 0.9 % IV SOLN
20.0000 mg | Freq: Once | INTRAVENOUS | Status: AC
  Administered 2018-05-01: 20 mg via INTRAVENOUS
  Filled 2018-05-01: qty 2

## 2018-05-01 MED ORDER — SODIUM CHLORIDE 0.9 % IV SOLN
Freq: Once | INTRAVENOUS | Status: AC
  Administered 2018-05-01: 12:00:00 via INTRAVENOUS

## 2018-05-01 MED ORDER — SODIUM CHLORIDE 0.9% FLUSH
10.0000 mL | INTRAVENOUS | Status: DC | PRN
  Administered 2018-05-01: 10 mL
  Filled 2018-05-01: qty 10

## 2018-05-01 MED ORDER — FAMOTIDINE IN NACL 20-0.9 MG/50ML-% IV SOLN
20.0000 mg | Freq: Once | INTRAVENOUS | Status: AC
  Administered 2018-05-01: 20 mg via INTRAVENOUS

## 2018-05-01 MED ORDER — MONTELUKAST SODIUM 10 MG PO TABS: 10.0000 mg | ORAL_TABLET | Freq: Every day | ORAL | 0 refills | Status: DC

## 2018-05-01 MED ORDER — SODIUM CHLORIDE 0.9 % IV SOLN
65.0000 mg/m2 | Freq: Once | INTRAVENOUS | Status: AC
  Administered 2018-05-01: 102 mg via INTRAVENOUS
  Filled 2018-05-01: qty 17

## 2018-05-01 MED ORDER — DIPHENHYDRAMINE HCL 50 MG/ML IJ SOLN
50.0000 mg | Freq: Once | INTRAMUSCULAR | Status: AC
  Administered 2018-05-01: 50 mg via INTRAVENOUS

## 2018-05-01 MED ORDER — HEPARIN SOD (PORK) LOCK FLUSH 100 UNIT/ML IV SOLN
500.0000 [IU] | Freq: Once | INTRAVENOUS | Status: AC | PRN
  Administered 2018-05-01: 500 [IU]
  Filled 2018-05-01: qty 5

## 2018-05-01 MED ORDER — DIPHENHYDRAMINE HCL 50 MG/ML IJ SOLN
INTRAMUSCULAR | Status: AC
  Filled 2018-05-01: qty 1
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
## 2018-05-01 NOTE — Progress Notes (Signed)
05/01/18 @ 1150  Confirmed dose of steroid premedication for today's dose of Taxol.  Per Dr Lindi Adie give Dexamethasone 20 mg IVPB do not increase amount.  T.O. Dr Jannifer Hick, PharmD

## 2018-05-01 NOTE — Patient Instructions (Addendum)
[facility] Discharge Instructions for Patients Receiving Chemotherapy  Today you received the following chemotherapy agents Taxol.  To help prevent nausea and vomiting after your treatment, we encourage you to take your nausea medication as directed. If you develop nausea and vomiting that is not controlled by your nausea medication, call the clinic.   BELOW ARE SYMPTOMS THAT SHOULD BE REPORTED IMMEDIATELY:  *FEVER GREATER THAN 100.5 F  *CHILLS WITH OR WITHOUT FEVER  NAUSEA AND VOMITING THAT IS NOT CONTROLLED WITH YOUR NAUSEA MEDICATION  *UNUSUAL SHORTNESS OF BREATH  *UNUSUAL BRUISING OR BLEEDING  TENDERNESS IN MOUTH AND THROAT WITH OR WITHOUT PRESENCE OF ULCERS  *URINARY PROBLEMS  *BOWEL PROBLEMS  UNUSUAL RASH Items with * indicate a potential emergency and should be followed up as soon as possible.  Feel free to call the clinic should you have any questions or concerns. The clinic phone number is [phone].  Please show the [facility] at check-in to the Emergency Department and triage nurse.     Hypokalemia Hypokalemia means that the amount of potassium in the blood is lower than normal.Potassium is a chemical that helps regulate the amount of fluid in the body (electrolyte). It also stimulates muscle tightening (contraction) and helps nerves work properly.Normally, most of the body's potassium is inside of cells, and only a very small amount is in the blood. Because the amount in the blood is so small, minor changes to potassium levels in the blood can be life-threatening. What are the causes? This condition may be caused by:  Antibiotic medicine.  Diarrhea or vomiting. Taking too much of a medicine that helps you have a bowel movement (laxative) can cause diarrhea and lead to hypokalemia.  Chronic kidney disease (CKD).  Medicines that help the body get rid of excess fluid (diuretics).  Eating disorders, such as bulimia.  Low magnesium  levels in the body.  Sweating a lot.  What are the signs or symptoms? Symptoms of this condition include:  Weakness.  Constipation.  Fatigue.  Muscle cramps.  Mental confusion.  Skipped heartbeats or irregular heartbeat (palpitations).  Tingling or numbness.  How is this diagnosed? This condition is diagnosed with a blood test. How is this treated? Hypokalemia can be treated by taking potassium supplements by mouth or adjusting the medicines that you take. Treatment may also include eating more foods that contain a lot of potassium. If your potassium level is very low, you may need to get potassium through an IV tube in one of your veins and be monitored in the hospital. Follow these instructions at home:  Take over-the-counter and prescription medicines only as told by your health care provider. This includes vitamins and supplements.  Eat a healthy diet. A healthy diet includes fresh fruits and vegetables, whole grains, healthy fats, and lean proteins.  If instructed, eat more foods that contain a lot of potassium, such as: ? Nuts, such as peanuts and pistachios. ? Seeds, such as sunflower seeds and pumpkin seeds. ? Peas, lentils, and lima beans. ? Whole grain and bran cereals and breads. ? Fresh fruits and vegetables, such as apricots, avocado, bananas, cantaloupe, kiwi, oranges, tomatoes, asparagus, and potatoes. ? Orange juice. ? Tomato juice. ? Red meats. ? Yogurt.  Keep all follow-up visits as told by your health care provider. This is important. Contact a health care provider if:  You have weakness that gets worse.  You feel your heart pounding or racing.  You vomit.  You have diarrhea.  You have diabetes (diabetes mellitus) and you have trouble keeping your blood sugar (glucose) in your target range. Get help right away if:  You have chest pain.  You have shortness of breath.  You have vomiting or diarrhea that lasts for more than 2 days.  You  faint. This information is not intended to replace advice given to you by your health care provider. Make sure you discuss any questions you have with your health care provider. Document Released: 12/11/2005 Document Revised: 07/29/2016 Document Reviewed: 07/29/2016 Elsevier Interactive Patient Education  2018 [employer].

## 2018-05-01 NOTE — Assessment & Plan Note (Addendum)
02/01/2018:Screening detected right breast mass in the upper inner quadrant measuring 1.5 cm, with additional enhancement it measured 2.2 cm.  No axillary lymph nodes.  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  Genetic counseling and testing 2. neoadjuvant chemotherapy with dose dense Adriamycin and Cytoxan every 2 weeks x4 followed by Taxol weekly x12  accompanied by carboplatin every 3 weeks x4 3. Followed by surgical options dependent on whether she has genetic mutation. 4.  Adjuvant radiation if she undergoes breast conserving surgery CT CAP and bone scan: Negative for metastases ---------------------------------------------------------------------------------------------------------------------------------  Current treatment:  Completed 4 cycles of Adriamycin and Cytoxan;  Taxol and Carboplatin (Carbo given every 3 weeks with AUC of 6) (third week of Taxol today)  Illana is doing moderately well.  After discussion with myself and Dr. Lindi Adie, due to the rash Virna will receive more steroids as a premedication today.  I will also prescribe Singulair for her to take.  She will switch after this cycle to Abraxane.    Charmagne also has a potassium level of 3 today.  She will receive some potassium IV today in addition to her treatment.

## 2018-05-01 NOTE — Patient Instructions (Signed)

## 2018-05-02 ENCOUNTER — Telehealth: Payer: Self-pay | Admitting: Adult Health

## 2018-05-02 NOTE — Telephone Encounter (Signed)
Per 5/8 no los °

## 2018-05-08 ENCOUNTER — Other Ambulatory Visit: Payer: Self-pay | Admitting: Hematology and Oncology

## 2018-05-08 ENCOUNTER — Inpatient Hospital Stay: Payer: BLUE CROSS/BLUE SHIELD

## 2018-05-08 ENCOUNTER — Other Ambulatory Visit: Payer: Self-pay | Admitting: *Deleted

## 2018-05-08 VITALS — BP 103/66 | HR 83 | Temp 98.1°F | Resp 18

## 2018-05-08 DIAGNOSIS — C50211 Malignant neoplasm of upper-inner quadrant of right female breast: Secondary | ICD-10-CM

## 2018-05-08 DIAGNOSIS — Z171 Estrogen receptor negative status [ER-]: Principal | ICD-10-CM

## 2018-05-08 DIAGNOSIS — R51 Headache: Secondary | ICD-10-CM | POA: Diagnosis not present

## 2018-05-08 DIAGNOSIS — C50011 Malignant neoplasm of nipple and areola, right female breast: Secondary | ICD-10-CM

## 2018-05-08 DIAGNOSIS — D709 Neutropenia, unspecified: Secondary | ICD-10-CM | POA: Diagnosis not present

## 2018-05-08 DIAGNOSIS — Z5111 Encounter for antineoplastic chemotherapy: Secondary | ICD-10-CM | POA: Diagnosis not present

## 2018-05-08 DIAGNOSIS — R21 Rash and other nonspecific skin eruption: Secondary | ICD-10-CM | POA: Diagnosis not present

## 2018-05-08 LAB — CBC WITH DIFFERENTIAL (CANCER CENTER ONLY)
Basophils Absolute: 0 10*3/uL (ref 0.0–0.1)
Basophils Relative: 0 %
EOS ABS: 0.1 10*3/uL (ref 0.0–0.5)
EOS PCT: 2 %
HCT: 29.7 % — ABNORMAL LOW (ref 34.8–46.6)
HEMOGLOBIN: 10 g/dL — AB (ref 11.6–15.9)
LYMPHS ABS: 1.1 10*3/uL (ref 0.9–3.3)
Lymphocytes Relative: 23 %
MCH: 31.4 pg (ref 25.1–34.0)
MCHC: 33.7 g/dL (ref 31.5–36.0)
MCV: 93.4 fL (ref 79.5–101.0)
MONOS PCT: 8 %
Monocytes Absolute: 0.4 10*3/uL (ref 0.1–0.9)
NEUTROS PCT: 67 %
Neutro Abs: 3.2 10*3/uL (ref 1.5–6.5)
Platelet Count: 165 10*3/uL (ref 145–400)
RBC: 3.18 MIL/uL — ABNORMAL LOW (ref 3.70–5.45)
RDW: 17.6 % — ABNORMAL HIGH (ref 11.2–14.5)
WBC Count: 4.8 10*3/uL (ref 3.9–10.3)

## 2018-05-08 LAB — CMP (CANCER CENTER ONLY)
ALT: 36 U/L (ref 0–55)
AST: 24 U/L (ref 5–34)
Albumin: 3.9 g/dL (ref 3.5–5.0)
Alkaline Phosphatase: 59 U/L (ref 40–150)
Anion gap: 6 (ref 3–11)
BUN: 9 mg/dL (ref 7–26)
CHLORIDE: 103 mmol/L (ref 98–109)
CO2: 30 mmol/L — AB (ref 22–29)
CREATININE: 0.59 mg/dL — AB (ref 0.60–1.10)
Calcium: 9.2 mg/dL (ref 8.4–10.4)
GFR, Est AFR Am: >60 mL/min (ref >60)
GFR, Estimated: >60 mL/min (ref >60)
Glucose, Bld: 94 mg/dL (ref 70–140)
Potassium: 3.6 mmol/L (ref 3.5–5.1)
Sodium: 139 mmol/L (ref 136–145)
Total Bilirubin: 0.4 mg/dL (ref 0.2–1.2)
Total Protein: 6.5 g/dL (ref 6.4–8.3)

## 2018-05-08 MED ORDER — PALONOSETRON HCL INJECTION 0.25 MG/5ML
0.2500 mg | Freq: Once | INTRAVENOUS | Status: AC
  Administered 2018-05-08: 0.25 mg via INTRAVENOUS

## 2018-05-08 MED ORDER — SODIUM CHLORIDE 0.9% FLUSH
10.0000 mL | INTRAVENOUS | Status: DC | PRN
  Administered 2018-05-08: 10 mL
  Filled 2018-05-08: qty 10

## 2018-05-08 MED ORDER — HEPARIN SOD (PORK) LOCK FLUSH 100 UNIT/ML IV SOLN
500.0000 [IU] | Freq: Once | INTRAVENOUS | Status: AC | PRN
  Administered 2018-05-08: 500 [IU]
  Filled 2018-05-08: qty 5

## 2018-05-08 MED ORDER — FAMOTIDINE IN NACL 20-0.9 MG/50ML-% IV SOLN
INTRAVENOUS | Status: AC
Start: 2018-05-08 — End: ?
  Filled 2018-05-08: qty 50

## 2018-05-08 MED ORDER — ALBUTEROL SULFATE (2.5 MG/3ML) 0.083% IN NEBU
2.5000 mg | INHALATION_SOLUTION | Freq: Once | RESPIRATORY_TRACT | Status: AC
  Administered 2018-05-08: 2.5 mg via RESPIRATORY_TRACT
  Filled 2018-05-08: qty 3

## 2018-05-08 MED ORDER — PACLITAXEL PROTEIN-BOUND CHEMO INJECTION 100 MG
65.0000 mg/m2 | Freq: Once | INTRAVENOUS | Status: AC
  Administered 2018-05-08: 100 mg via INTRAVENOUS
  Filled 2018-05-08: qty 20

## 2018-05-08 MED ORDER — SODIUM CHLORIDE 0.9% FLUSH
10.0000 mL | Freq: Once | INTRAVENOUS | Status: AC
  Administered 2018-05-08: 10 mL
  Filled 2018-05-08: qty 10

## 2018-05-08 MED ORDER — SODIUM CHLORIDE 0.9 % IV SOLN
690.0000 mg | Freq: Once | INTRAVENOUS | Status: AC
  Administered 2018-05-08: 690 mg via INTRAVENOUS
  Filled 2018-05-08: qty 69

## 2018-05-08 MED ORDER — PALONOSETRON HCL INJECTION 0.25 MG/5ML
INTRAVENOUS | Status: AC
  Filled 2018-05-08: qty 5

## 2018-05-08 MED ORDER — ALBUTEROL SULFATE (2.5 MG/3ML) 0.083% IN NEBU
2.5000 mg | INHALATION_SOLUTION | Freq: Once | RESPIRATORY_TRACT | Status: DC
  Filled 2018-05-08: qty 3

## 2018-05-08 MED ORDER — SODIUM CHLORIDE 0.9 % IV SOLN
Freq: Once | INTRAVENOUS | Status: AC
  Administered 2018-05-08: 13:00:00 via INTRAVENOUS
  Filled 2018-05-08: qty 5

## 2018-05-08 MED ORDER — FAMOTIDINE IN NACL 20-0.9 MG/50ML-% IV SOLN
20.0000 mg | Freq: Once | INTRAVENOUS | Status: AC
  Administered 2018-05-08: 20 mg via INTRAVENOUS

## 2018-05-08 MED ORDER — ALBUTEROL SULFATE (2.5 MG/3ML) 0.083% IN NEBU
INHALATION_SOLUTION | RESPIRATORY_TRACT | Status: AC
  Filled 2018-05-08: qty 3

## 2018-05-08 MED ORDER — SODIUM CHLORIDE 0.9 % IV SOLN
Freq: Once | INTRAVENOUS | Status: AC
  Administered 2018-05-08: 12:00:00 via INTRAVENOUS

## 2018-05-08 NOTE — Patient Instructions (Addendum)
[facility] Discharge Instructions for Patients Receiving Chemotherapy  Today you received the following chemotherapy agents Abraxane,Carboplatin  To help prevent nausea and vomiting after your treatment, we encourage you to take your nausea medication as directed   If you develop nausea and vomiting that is not controlled by your nausea medication, call the clinic.   BELOW ARE SYMPTOMS THAT SHOULD BE REPORTED IMMEDIATELY:  *FEVER GREATER THAN 100.5 F  *CHILLS WITH OR WITHOUT FEVER  NAUSEA AND VOMITING THAT IS NOT CONTROLLED WITH YOUR NAUSEA MEDICATION  *UNUSUAL SHORTNESS OF BREATH  *UNUSUAL BRUISING OR BLEEDING  TENDERNESS IN MOUTH AND THROAT WITH OR WITHOUT PRESENCE OF ULCERS  *URINARY PROBLEMS  *BOWEL PROBLEMS  UNUSUAL RASH Items with * indicate a potential emergency and should be followed up as soon as possible.  Feel free to call the clinic should you have any questions or concerns. The clinic phone number is [phone].  Please show the [facility] at check-in to the Emergency Department and triage nurse.    Nanoparticle Albumin-Bound Paclitaxel injection What is this medicine? NANOPARTICLE ALBUMIN-BOUND PACLITAXEL (Na no PAHR ti kuhl al BYOO muhn-bound PAK li TAX el) is a chemotherapy drug. It targets fast dividing cells, like cancer cells, and causes these cells to die. This medicine is used to treat advanced breast cancer and advanced lung cancer. This medicine may be used for other purposes; ask your health care provider or pharmacist if you have questions. COMMON BRAND NAME(S): Abraxane What should I tell my health care provider before I take this medicine? They need to know if you have any of these conditions: -kidney disease -liver disease -low blood counts, like low platelets, red blood cells, or white blood cells -recent or ongoing radiation therapy -an unusual or allergic reaction to paclitaxel, albumin, other chemotherapy, other  medicines, foods, dyes, or preservatives -pregnant or trying to get pregnant -breast-feeding How should I use this medicine? This drug is given as an infusion into a vein. It is administered in a hospital or clinic by a specially trained health care professional. Talk to your pediatrician regarding the use of this medicine in children. Special care may be needed. Overdosage: If you think you have taken too much of this medicine contact a poison control center or emergency room at once. NOTE: This medicine is only for you. Do not share this medicine with others. What if I miss a dose? It is important not to miss your dose. Call your doctor or health care professional if you are unable to keep an appointment. What may interact with this medicine? -cyclosporine -diazepam -ketoconazole -medicines to increase blood counts like filgrastim, pegfilgrastim, sargramostim -other chemotherapy drugs like cisplatin, doxorubicin, epirubicin, etoposide, teniposide, vincristine -quinidine -testosterone -vaccines -verapamil Talk to your doctor or health care professional before taking any of these medicines: -acetaminophen -aspirin -ibuprofen -ketoprofen -naproxen This list may not describe all possible interactions. Give your health care provider a list of all the medicines, herbs, non-prescription drugs, or dietary supplements you use. Also tell them if you smoke, drink alcohol, or use illegal drugs. Some items may interact with your medicine. What should I watch for while using this medicine? Your condition will be monitored carefully while you are receiving this medicine. You will need important blood work done while you are taking this medicine. This medicine can cause serious allergic reactions. If you experience allergic reactions like skin rash, itching or hives, swelling of the face, lips, or tongue, tell your doctor or health  care professional right away. In some cases, you may be given  additional medicines to help with side effects. Follow all directions for their use. This drug may make you feel generally unwell. This is not uncommon, as chemotherapy can affect healthy cells as well as cancer cells. Report any side effects. Continue your course of treatment even though you feel ill unless your doctor tells you to stop. Call your doctor or health care professional for advice if you get a fever, chills or sore throat, or other symptoms of a cold or flu. Do not treat yourself. This drug decreases your body's ability to fight infections. Try to avoid being around people who are sick. This medicine may increase your risk to bruise or bleed. Call your doctor or health care professional if you notice any unusual bleeding. Be careful brushing and flossing your teeth or using a toothpick because you may get an infection or bleed more easily. If you have any dental work done, tell your dentist you are receiving this medicine. Avoid taking products that contain aspirin, acetaminophen, ibuprofen, naproxen, or ketoprofen unless instructed by your doctor. These medicines may hide a fever. Do not become pregnant while taking this medicine. Women should inform their doctor if they wish to become pregnant or think they might be pregnant. There is a potential for serious side effects to an unborn child. Talk to your health care professional or pharmacist for more information. Do not breast-feed an infant while taking this medicine. Men are advised not to father a child while receiving this medicine. What side effects may I notice from receiving this medicine? Side effects that you should report to your doctor or health care professional as soon as possible: -allergic reactions like skin rash, itching or hives, swelling of the face, lips, or tongue -low blood counts - This drug may decrease the number of white blood cells, red blood cells and platelets. You may be at increased risk for infections and  bleeding. -signs of infection - fever or chills, cough, sore throat, pain or difficulty passing urine -signs of decreased platelets or bleeding - bruising, pinpoint red spots on the skin, black, tarry stools, nosebleeds -signs of decreased red blood cells - unusually weak or tired, fainting spells, lightheadedness -breathing problems -changes in vision -chest pain -high or low blood pressure -mouth sores -nausea and vomiting -pain, swelling, redness or irritation at the injection site -pain, tingling, numbness in the hands or feet -slow or irregular heartbeat -swelling of the ankle, feet, hands Side effects that usually do not require medical attention (report to your doctor or health care professional if they continue or are bothersome): -aches, pains -changes in the color of fingernails -diarrhea -hair loss -loss of appetite This list may not describe all possible side effects. Call your doctor for medical advice about side effects. You may report side effects to FDA at 1-800-FDA-1088. Where should I keep my medicine? This drug is given in a hospital or clinic and will not be stored at home. NOTE: This sheet is a summary. It may not cover all possible information. If you have questions about this medicine, talk to your doctor, pharmacist, or health care provider.  2018 Elsevier/Gold Standard (2015-10-13 10:05:20)   Carboplatin injection What is this medicine? CARBOPLATIN (KAR boe pla tin) is a chemotherapy drug. It targets fast dividing cells, like cancer cells, and causes these cells to die. This medicine is used to treat ovarian cancer and many other cancers. This medicine may be used for  other purposes; ask your health care provider or pharmacist if you have questions. COMMON BRAND NAME(S): Paraplatin What should I tell my health care provider before I take this medicine? They need to know if you have any of these conditions: -blood disorders -hearing problems -kidney  disease -recent or ongoing radiation therapy -an unusual or allergic reaction to carboplatin, cisplatin, other chemotherapy, other medicines, foods, dyes, or preservatives -pregnant or trying to get pregnant -breast-feeding How should I use this medicine? This drug is usually given as an infusion into a vein. It is administered in a hospital or clinic by a specially trained health care professional. Talk to your pediatrician regarding the use of this medicine in children. Special care may be needed. Overdosage: If you think you have taken too much of this medicine contact a poison control center or emergency room at once. NOTE: This medicine is only for you. Do not share this medicine with others. What if I miss a dose? It is important not to miss a dose. Call your doctor or health care professional if you are unable to keep an appointment. What may interact with this medicine? -medicines for seizures -medicines to increase blood counts like filgrastim, pegfilgrastim, sargramostim -some antibiotics like amikacin, gentamicin, neomycin, streptomycin, tobramycin -vaccines Talk to your doctor or health care professional before taking any of these medicines: -acetaminophen -aspirin -ibuprofen -ketoprofen -naproxen This list may not describe all possible interactions. Give your health care provider a list of all the medicines, herbs, non-prescription drugs, or dietary supplements you use. Also tell them if you smoke, drink alcohol, or use illegal drugs. Some items may interact with your medicine. What should I watch for while using this medicine? Your condition will be monitored carefully while you are receiving this medicine. You will need important blood work done while you are taking this medicine. This drug may make you feel generally unwell. This is not uncommon, as chemotherapy can affect healthy cells as well as cancer cells. Report any side effects. Continue your course of treatment even  though you feel ill unless your doctor tells you to stop. In some cases, you may be given additional medicines to help with side effects. Follow all directions for their use. Call your doctor or health care professional for advice if you get a fever, chills or sore throat, or other symptoms of a cold or flu. Do not treat yourself. This drug decreases your body's ability to fight infections. Try to avoid being around people who are sick. This medicine may increase your risk to bruise or bleed. Call your doctor or health care professional if you notice any unusual bleeding. Be careful brushing and flossing your teeth or using a toothpick because you may get an infection or bleed more easily. If you have any dental work done, tell your dentist you are receiving this medicine. Avoid taking products that contain aspirin, acetaminophen, ibuprofen, naproxen, or ketoprofen unless instructed by your doctor. These medicines may hide a fever. Do not become pregnant while taking this medicine. Women should inform their doctor if they wish to become pregnant or think they might be pregnant. There is a potential for serious side effects to an unborn child. Talk to your health care professional or pharmacist for more information. Do not breast-feed an infant while taking this medicine. What side effects may I notice from receiving this medicine? Side effects that you should report to your doctor or health care professional as soon as possible: -allergic reactions like skin rash, itching  or hives, swelling of the face, lips, or tongue -signs of infection - fever or chills, cough, sore throat, pain or difficulty passing urine -signs of decreased platelets or bleeding - bruising, pinpoint red spots on the skin, black, tarry stools, nosebleeds -signs of decreased red blood cells - unusually weak or tired, fainting spells, lightheadedness -breathing problems -changes in hearing -changes in vision -chest pain -high  blood pressure -low blood counts - This drug may decrease the number of white blood cells, red blood cells and platelets. You may be at increased risk for infections and bleeding. -nausea and vomiting -pain, swelling, redness or irritation at the injection site -pain, tingling, numbness in the hands or feet -problems with balance, talking, walking -trouble passing urine or change in the amount of urine Side effects that usually do not require medical attention (report to your doctor or health care professional if they continue or are bothersome): -hair loss -loss of appetite -metallic taste in the mouth or changes in taste This list may not describe all possible side effects. Call your doctor for medical advice about side effects. You may report side effects to FDA at 1-800-FDA-1088. Where should I keep my medicine? This drug is given in a hospital or clinic and will not be stored at home. NOTE: This sheet is a summary. It may not cover all possible information. If you have questions about this medicine, talk to your doctor, pharmacist, or health care provider.  2018 Elsevier/Gold Standard (2008-03-17 14:38:05)

## 2018-05-15 ENCOUNTER — Inpatient Hospital Stay (HOSPITAL_BASED_OUTPATIENT_CLINIC_OR_DEPARTMENT_OTHER): Payer: BLUE CROSS/BLUE SHIELD | Admitting: Adult Health

## 2018-05-15 ENCOUNTER — Inpatient Hospital Stay: Payer: BLUE CROSS/BLUE SHIELD

## 2018-05-15 ENCOUNTER — Encounter: Payer: Self-pay | Admitting: Adult Health

## 2018-05-15 ENCOUNTER — Telehealth: Payer: Self-pay | Admitting: Adult Health

## 2018-05-15 DIAGNOSIS — R21 Rash and other nonspecific skin eruption: Secondary | ICD-10-CM | POA: Diagnosis not present

## 2018-05-15 DIAGNOSIS — C50011 Malignant neoplasm of nipple and areola, right female breast: Secondary | ICD-10-CM

## 2018-05-15 DIAGNOSIS — C50211 Malignant neoplasm of upper-inner quadrant of right female breast: Secondary | ICD-10-CM | POA: Diagnosis not present

## 2018-05-15 DIAGNOSIS — R51 Headache: Secondary | ICD-10-CM | POA: Diagnosis not present

## 2018-05-15 DIAGNOSIS — D709 Neutropenia, unspecified: Secondary | ICD-10-CM | POA: Diagnosis not present

## 2018-05-15 DIAGNOSIS — Z171 Estrogen receptor negative status [ER-]: Principal | ICD-10-CM

## 2018-05-15 DIAGNOSIS — Z5111 Encounter for antineoplastic chemotherapy: Secondary | ICD-10-CM | POA: Diagnosis not present

## 2018-05-15 LAB — CMP (CANCER CENTER ONLY)
ALBUMIN: 4.1 g/dL (ref 3.5–5.0)
ALT: 64 U/L — ABNORMAL HIGH (ref 0–55)
AST: 32 U/L (ref 5–34)
Alkaline Phosphatase: 56 U/L (ref 40–150)
Anion gap: 6 (ref 3–11)
BUN: 7 mg/dL (ref 7–26)
CHLORIDE: 105 mmol/L (ref 98–109)
CO2: 29 mmol/L (ref 22–29)
Calcium: 9.5 mg/dL (ref 8.4–10.4)
Creatinine: 0.6 mg/dL (ref 0.60–1.10)
GFR, Est AFR Am: >60 mL/min (ref >60)
GFR, Estimated: >60 mL/min (ref >60)
GLUCOSE: 99 mg/dL (ref 70–140)
POTASSIUM: 3.4 mmol/L — AB (ref 3.5–5.1)
SODIUM: 140 mmol/L (ref 136–145)
Total Bilirubin: 0.3 mg/dL (ref 0.2–1.2)
Total Protein: 6.6 g/dL (ref 6.4–8.3)

## 2018-05-15 LAB — CBC WITH DIFFERENTIAL (CANCER CENTER ONLY)
Basophils Absolute: 0 10*3/uL (ref 0.0–0.1)
Basophils Relative: 1 %
EOS PCT: 2 %
Eosinophils Absolute: 0 10*3/uL (ref 0.0–0.5)
HEMATOCRIT: 25.1 % — AB (ref 34.8–46.6)
Hemoglobin: 8.6 g/dL — ABNORMAL LOW (ref 11.6–15.9)
LYMPHS ABS: 0.7 10*3/uL — AB (ref 0.9–3.3)
LYMPHS PCT: 36 %
MCH: 31.6 pg (ref 25.1–34.0)
MCHC: 34.1 g/dL (ref 31.5–36.0)
MCV: 92.8 fL (ref 79.5–101.0)
MONO ABS: 0.1 10*3/uL (ref 0.1–0.9)
Monocytes Relative: 7 %
NEUTROS ABS: 1 10*3/uL — AB (ref 1.5–6.5)
Neutrophils Relative %: 54 %
PLATELETS: 162 10*3/uL (ref 145–400)
RBC: 2.71 MIL/uL — AB (ref 3.70–5.45)
RDW: 17.2 % — AB (ref 11.2–14.5)
WBC Count: 1.8 10*3/uL — ABNORMAL LOW (ref 3.9–10.3)

## 2018-05-15 MED ORDER — SODIUM CHLORIDE 0.9 % IV SOLN: 20.0000 mg | Freq: Once | INTRAVENOUS | Status: DC

## 2018-05-15 MED ORDER — SODIUM CHLORIDE 0.9 % IV SOLN
Freq: Once | INTRAVENOUS | Status: AC
  Administered 2018-05-15: 11:00:00 via INTRAVENOUS

## 2018-05-15 MED ORDER — SODIUM CHLORIDE 0.9% FLUSH
10.0000 mL | INTRAVENOUS | Status: DC | PRN
  Administered 2018-05-15: 10 mL
  Filled 2018-05-15: qty 10

## 2018-05-15 MED ORDER — PACLITAXEL PROTEIN-BOUND CHEMO INJECTION 100 MG
50.0000 mg/m2 | Freq: Once | INTRAVENOUS | Status: AC
  Administered 2018-05-15: 75 mg via INTRAVENOUS
  Filled 2018-05-15: qty 15

## 2018-05-15 MED ORDER — HEPARIN SOD (PORK) LOCK FLUSH 100 UNIT/ML IV SOLN
500.0000 [IU] | Freq: Once | INTRAVENOUS | Status: AC | PRN
  Administered 2018-05-15: 500 [IU]
  Filled 2018-05-15: qty 5

## 2018-05-15 MED ORDER — SODIUM CHLORIDE 0.9% FLUSH
10.0000 mL | Freq: Once | INTRAVENOUS | Status: AC
  Administered 2018-05-15: 10 mL
  Filled 2018-05-15: qty 10

## 2018-05-15 MED ORDER — FAMOTIDINE IN NACL 20-0.9 MG/50ML-% IV SOLN: 20.0000 mg | Freq: Once | INTRAVENOUS | Status: DC

## 2018-05-15 MED ORDER — FAMOTIDINE IN NACL 20-0.9 MG/50ML-% IV SOLN
INTRAVENOUS | Status: AC
  Filled 2018-05-15: qty 50

## 2018-05-15 NOTE — Telephone Encounter (Signed)
Per 5/22 no los

## 2018-05-15 NOTE — Progress Notes (Signed)
05/15/18 @ 1100  Do not give Dexamethasone IVPB or Pepcid IVPB prior to Abraxane only days.  Do not administer any premedications for Abraxane at this time as patient tolerated it well with the first cycle.  T.O. Dr Jannifer Hick, PharmD

## 2018-05-15 NOTE — Patient Instructions (Signed)
[facility] Discharge Instructions for Patients Receiving Chemotherapy  Today you received the following chemotherapy agents:  paclitaxel protein-bound (Abraxane).  To help prevent nausea and vomiting after your treatment, we encourage you to take your nausea medication as directed.   If you develop nausea and vomiting that is not controlled by your nausea medication, call the clinic.   BELOW ARE SYMPTOMS THAT SHOULD BE REPORTED IMMEDIATELY:  *FEVER GREATER THAN 100.5 F  *CHILLS WITH OR WITHOUT FEVER  NAUSEA AND VOMITING THAT IS NOT CONTROLLED WITH YOUR NAUSEA MEDICATION  *UNUSUAL SHORTNESS OF BREATH  *UNUSUAL BRUISING OR BLEEDING  TENDERNESS IN MOUTH AND THROAT WITH OR WITHOUT PRESENCE OF ULCERS  *URINARY PROBLEMS  *BOWEL PROBLEMS  UNUSUAL RASH Items with * indicate a potential emergency and should be followed up as soon as possible.  Feel free to call the clinic should you have any questions or concerns. The clinic phone number is [phone].  Please show the CHEMO ALERT CARD at check-in to the Emergency Department and triage nurse.   

## 2018-05-15 NOTE — Assessment & Plan Note (Addendum)
02/01/2018:Screening detected right breast mass in the upper inner quadrant measuring 1.5 cm, with additional enhancement it measured 2.2 cm.  No axillary lymph nodes.  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  Genetic counseling and testing 2. neoadjuvant chemotherapy with dose dense Adriamycin and Cytoxan every 2 weeks x4 followed by Taxol weekly x12 (changed to Abraxane due to rash)  accompanied by carboplatin every 3 weeks x4 3. Followed by surgical options dependent on whether she has genetic mutation. 4.  Adjuvant radiation if she undergoes breast conserving surgery CT CAP and bone scan: Negative for metastases ---------------------------------------------------------------------------------------------------------------------------------  Current treatment:  Completed 4 cycles of Adriamycin and Cytoxan;  Abraxane and Carboplatin (Carbo given every 3 weeks with AUC of 6)   [name] is doing moderately well.  Her ANC is lower as well as her hemoglobin.  She continues to have difficulty with the rash, and also has a new headache.  Due to her many issues, I reviewed her case with Dr. Lindi Adie, who also saw her today. She will proceed with chemotherapy today.  He eliminated Aloxi and Emend from her treatment plan.  He also dose reduced her Abraxane due to her cytopenias and reviewed with her the possibility of Neupogen in the future if her [place] remains low.  She will continue on her current medications for her allergies.    She will return weekly for labs/treatment and will continue to f/u with [place] every other week.
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
## 2018-05-15 NOTE — Progress Notes (Signed)
Dr. Lindi Adie okay to tx with ANC 1.0. Abraxane dose reduced.

## 2018-05-22 ENCOUNTER — Inpatient Hospital Stay: Payer: BLUE CROSS/BLUE SHIELD

## 2018-05-22 VITALS — BP 99/65 | HR 80 | Temp 98.5°F | Resp 18

## 2018-05-22 DIAGNOSIS — R21 Rash and other nonspecific skin eruption: Secondary | ICD-10-CM | POA: Diagnosis not present

## 2018-05-22 DIAGNOSIS — R51 Headache: Secondary | ICD-10-CM | POA: Diagnosis not present

## 2018-05-22 DIAGNOSIS — D709 Neutropenia, unspecified: Secondary | ICD-10-CM | POA: Diagnosis not present

## 2018-05-22 DIAGNOSIS — Z171 Estrogen receptor negative status [ER-]: Principal | ICD-10-CM

## 2018-05-22 DIAGNOSIS — C50211 Malignant neoplasm of upper-inner quadrant of right female breast: Secondary | ICD-10-CM

## 2018-05-22 DIAGNOSIS — C50011 Malignant neoplasm of nipple and areola, right female breast: Secondary | ICD-10-CM

## 2018-05-22 DIAGNOSIS — Z5111 Encounter for antineoplastic chemotherapy: Secondary | ICD-10-CM | POA: Diagnosis not present

## 2018-05-22 LAB — CBC WITH DIFFERENTIAL (CANCER CENTER ONLY)
Basophils Absolute: 0 10*3/uL (ref 0.0–0.1)
Basophils Relative: 0 %
Eosinophils Absolute: 0 10*3/uL (ref 0.0–0.5)
Eosinophils Relative: 1 %
HEMATOCRIT: 24.4 % — AB (ref 34.8–46.6)
HEMOGLOBIN: 8.2 g/dL — AB (ref 11.6–15.9)
LYMPHS ABS: 0.9 10*3/uL (ref 0.9–3.3)
LYMPHS PCT: 40 %
MCH: 31.8 pg (ref 25.1–34.0)
MCHC: 33.6 g/dL (ref 31.5–36.0)
MCV: 94.6 fL (ref 79.5–101.0)
MONOS PCT: 9 %
Monocytes Absolute: 0.2 10*3/uL (ref 0.1–0.9)
NEUTROS ABS: 1.2 10*3/uL — AB (ref 1.5–6.5)
Neutrophils Relative %: 50 %
Platelet Count: 113 10*3/uL — ABNORMAL LOW (ref 145–400)
RBC: 2.58 MIL/uL — ABNORMAL LOW (ref 3.70–5.45)
RDW: 16.1 % — AB (ref 11.2–14.5)
WBC Count: 2.3 10*3/uL — ABNORMAL LOW (ref 3.9–10.3)

## 2018-05-22 LAB — CMP (CANCER CENTER ONLY)
ALT: 60 U/L — ABNORMAL HIGH (ref 0–55)
ANION GAP: 8 (ref 3–11)
AST: 31 U/L (ref 5–34)
Albumin: 4.2 g/dL (ref 3.5–5.0)
Alkaline Phosphatase: 56 U/L (ref 40–150)
BILIRUBIN TOTAL: 0.3 mg/dL (ref 0.2–1.2)
BUN: 8 mg/dL (ref 7–26)
CHLORIDE: 105 mmol/L (ref 98–109)
CO2: 27 mmol/L (ref 22–29)
Calcium: 9.4 mg/dL (ref 8.4–10.4)
Creatinine: 0.62 mg/dL (ref 0.60–1.10)
GFR, Est AFR Am: >60 mL/min (ref >60)
Glucose, Bld: 94 mg/dL (ref 70–140)
POTASSIUM: 3.5 mmol/L (ref 3.5–5.1)
Sodium: 140 mmol/L (ref 136–145)
TOTAL PROTEIN: 6.6 g/dL (ref 6.4–8.3)

## 2018-05-22 MED ORDER — PACLITAXEL PROTEIN-BOUND CHEMO INJECTION 100 MG
50.0000 mg/m2 | Freq: Once | INTRAVENOUS | Status: AC
  Administered 2018-05-22: 75 mg via INTRAVENOUS
  Filled 2018-05-22: qty 15

## 2018-05-22 MED ORDER — SODIUM CHLORIDE 0.9% FLUSH
10.0000 mL | INTRAVENOUS | Status: DC | PRN
  Administered 2018-05-22: 10 mL
  Filled 2018-05-22: qty 10

## 2018-05-22 MED ORDER — HEPARIN SOD (PORK) LOCK FLUSH 100 UNIT/ML IV SOLN
500.0000 [IU] | Freq: Once | INTRAVENOUS | Status: AC | PRN
  Administered 2018-05-22: 500 [IU]
  Filled 2018-05-22: qty 5

## 2018-05-22 MED ORDER — SODIUM CHLORIDE 0.9% FLUSH
10.0000 mL | Freq: Once | INTRAVENOUS | Status: AC
  Administered 2018-05-22: 10 mL
  Filled 2018-05-22: qty 10

## 2018-05-22 MED ORDER — SODIUM CHLORIDE 0.9 % IV SOLN
Freq: Once | INTRAVENOUS | Status: AC
  Administered 2018-05-22: 13:00:00 via INTRAVENOUS

## 2018-05-22 NOTE — Patient Instructions (Signed)
[facility] Discharge Instructions for Patients Receiving Chemotherapy  Today you received the following chemotherapy agents:  paclitaxel protein-bound (Abraxane).  To help prevent nausea and vomiting after your treatment, we encourage you to take your nausea medication as directed.   If you develop nausea and vomiting that is not controlled by your nausea medication, call the clinic.   BELOW ARE SYMPTOMS THAT SHOULD BE REPORTED IMMEDIATELY:  *FEVER GREATER THAN 100.5 F  *CHILLS WITH OR WITHOUT FEVER  NAUSEA AND VOMITING THAT IS NOT CONTROLLED WITH YOUR NAUSEA MEDICATION  *UNUSUAL SHORTNESS OF BREATH  *UNUSUAL BRUISING OR BLEEDING  TENDERNESS IN MOUTH AND THROAT WITH OR WITHOUT PRESENCE OF ULCERS  *URINARY PROBLEMS  *BOWEL PROBLEMS  UNUSUAL RASH Items with * indicate a potential emergency and should be followed up as soon as possible.  Feel free to call the clinic should you have any questions or concerns. The clinic phone number is [phone].  Please show the CHEMO ALERT CARD at check-in to the Emergency Department and triage nurse.   

## 2018-05-22 NOTE — Progress Notes (Signed)
Per Dr. Gudena ok to treat with ANC of 1.2 

## 2018-05-29 ENCOUNTER — Inpatient Hospital Stay (HOSPITAL_BASED_OUTPATIENT_CLINIC_OR_DEPARTMENT_OTHER): Payer: BLUE CROSS/BLUE SHIELD | Admitting: Hematology and Oncology

## 2018-05-29 ENCOUNTER — Inpatient Hospital Stay: Payer: BLUE CROSS/BLUE SHIELD

## 2018-05-29 ENCOUNTER — Telehealth: Payer: Self-pay | Admitting: Hematology and Oncology

## 2018-05-29 ENCOUNTER — Inpatient Hospital Stay: Payer: BLUE CROSS/BLUE SHIELD | Attending: Genetic Counselor

## 2018-05-29 ENCOUNTER — Telehealth: Payer: Self-pay

## 2018-05-29 VITALS — BP 93/63

## 2018-05-29 DIAGNOSIS — D649 Anemia, unspecified: Secondary | ICD-10-CM | POA: Insufficient documentation

## 2018-05-29 DIAGNOSIS — D696 Thrombocytopenia, unspecified: Secondary | ICD-10-CM | POA: Insufficient documentation

## 2018-05-29 DIAGNOSIS — Z5111 Encounter for antineoplastic chemotherapy: Secondary | ICD-10-CM | POA: Insufficient documentation

## 2018-05-29 DIAGNOSIS — C50011 Malignant neoplasm of nipple and areola, right female breast: Secondary | ICD-10-CM

## 2018-05-29 DIAGNOSIS — C50211 Malignant neoplasm of upper-inner quadrant of right female breast: Secondary | ICD-10-CM

## 2018-05-29 DIAGNOSIS — D709 Neutropenia, unspecified: Secondary | ICD-10-CM | POA: Insufficient documentation

## 2018-05-29 DIAGNOSIS — Z79899 Other long term (current) drug therapy: Secondary | ICD-10-CM | POA: Insufficient documentation

## 2018-05-29 DIAGNOSIS — Z171 Estrogen receptor negative status [ER-]: Secondary | ICD-10-CM

## 2018-05-29 DIAGNOSIS — R0981 Nasal congestion: Secondary | ICD-10-CM | POA: Insufficient documentation

## 2018-05-29 LAB — CBC WITH DIFFERENTIAL (CANCER CENTER ONLY)
BASOS ABS: 0 10*3/uL (ref 0.0–0.1)
BASOS PCT: 0 %
EOS PCT: 1 %
Eosinophils Absolute: 0 10*3/uL (ref 0.0–0.5)
HCT: 27.8 % — ABNORMAL LOW (ref 34.8–46.6)
Hemoglobin: 9.4 g/dL — ABNORMAL LOW (ref 11.6–15.9)
Lymphocytes Relative: 36 %
Lymphs Abs: 1.1 10*3/uL (ref 0.9–3.3)
MCH: 32.3 pg (ref 25.1–34.0)
MCHC: 33.8 g/dL (ref 31.5–36.0)
MCV: 95.5 fL (ref 79.5–101.0)
MONO ABS: 0.2 10*3/uL (ref 0.1–0.9)
MONOS PCT: 7 %
Neutro Abs: 1.7 10*3/uL (ref 1.5–6.5)
Neutrophils Relative %: 56 %
PLATELETS: 88 10*3/uL — AB (ref 145–400)
RBC: 2.91 MIL/uL — ABNORMAL LOW (ref 3.70–5.45)
RDW: 16.1 % — AB (ref 11.2–14.5)
WBC Count: 3.1 10*3/uL — ABNORMAL LOW (ref 3.9–10.3)

## 2018-05-29 LAB — CMP (CANCER CENTER ONLY)
ALBUMIN: 4.5 g/dL (ref 3.5–5.0)
ALT: 47 U/L (ref 0–55)
AST: 28 U/L (ref 5–34)
Alkaline Phosphatase: 70 U/L (ref 40–150)
Anion gap: 9 (ref 3–11)
BILIRUBIN TOTAL: 0.4 mg/dL (ref 0.2–1.2)
BUN: 9 mg/dL (ref 7–26)
CALCIUM: 9.8 mg/dL (ref 8.4–10.4)
CO2: 28 mmol/L (ref 22–29)
CREATININE: 0.66 mg/dL (ref 0.60–1.10)
Chloride: 104 mmol/L (ref 98–109)
GFR, Est AFR Am: >60 mL/min (ref >60)
GFR, Estimated: >60 mL/min (ref >60)
GLUCOSE: 103 mg/dL (ref 70–140)
Potassium: 3.4 mmol/L — ABNORMAL LOW (ref 3.5–5.1)
Sodium: 141 mmol/L (ref 136–145)
TOTAL PROTEIN: 6.8 g/dL (ref 6.4–8.3)

## 2018-05-29 MED ORDER — HEPARIN SOD (PORK) LOCK FLUSH 100 UNIT/ML IV SOLN
500.0000 [IU] | Freq: Once | INTRAVENOUS | Status: AC | PRN
  Administered 2018-05-29: 500 [IU]
  Filled 2018-05-29: qty 5

## 2018-05-29 MED ORDER — SODIUM CHLORIDE 0.9% FLUSH
10.0000 mL | Freq: Once | INTRAVENOUS | Status: AC
  Administered 2018-05-29: 10 mL
  Filled 2018-05-29: qty 10

## 2018-05-29 MED ORDER — PACLITAXEL PROTEIN-BOUND CHEMO INJECTION 100 MG
50.0000 mg/m2 | Freq: Once | INTRAVENOUS | Status: AC
  Administered 2018-05-29: 75 mg via INTRAVENOUS
  Filled 2018-05-29: qty 15

## 2018-05-29 MED ORDER — SODIUM CHLORIDE 0.9% FLUSH
10.0000 mL | INTRAVENOUS | Status: DC | PRN
  Administered 2018-05-29: 10 mL
  Filled 2018-05-29: qty 10

## 2018-05-29 MED ORDER — SODIUM CHLORIDE 0.9 % IV SOLN
Freq: Once | INTRAVENOUS | Status: AC
  Administered 2018-05-29: 14:00:00 via INTRAVENOUS

## 2018-05-29 NOTE — Assessment & Plan Note (Signed)
02/01/2018:Screening detected right breast mass in the upper inner quadrant measuring 1.5 cm, with additional enhancement it measured 2.2 cm.  No axillary lymph nodes.  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  Genetic counseling and testing 2. neoadjuvant chemotherapy with dose dense Adriamycin and Cytoxan every 2 weeks x4 followed by Taxol weekly x12 (changed to Abraxane due to rash)  accompanied by carboplatin every 3 weeks x4 3. Followed by surgical options dependent on whether she has genetic mutation. 4.  Adjuvant radiation if she undergoes breast conserving surgery CT CAP and bone scan: Negative for metastases ---------------------------------------------------------------------------------------------------------------------------------  Current treatment:  Completed 4 cycles of Adriamycin and Cytoxan;  Abraxane cycle 7 and Carboplatin (Carbo given every 3 weeks with AUC of 6)  Toxicities: 1.  Thrombocytopenia: Platelet count only 86.  We will not give carboplatin today.  She will receive Abraxane. 2. anemia: Slight improvement than before. 3.  Sinus congestion Improvement in neutropenia.  Next week we will plan on giving carboplatin. Patient understands fully well that if her counts are not adequate then we may not be able to do treatment next week.

## 2018-05-29 NOTE — Telephone Encounter (Signed)
Gave patient AVS and calendar of upcoming June and July appointments.  °

## 2018-05-29 NOTE — Telephone Encounter (Signed)
Per Dr Lindi Adie, he is ok with today's platelet count, he is keeping the Abraxane today but removing the Carbo.  Notified Engineering geologist in infusion.

## 2018-05-29 NOTE — Progress Notes (Signed)
Patient Care Team: [name], MD as PCP - General (Cardiology)  DIAGNOSIS:  Encounter Diagnosis  Name Primary?  . Malignant neoplasm of upper-inner quadrant of right breast in female, estrogen receptor negative ([place])     SUMMARY OF ONCOLOGIC HISTORY:   Malignant neoplasm of upper-inner quadrant of right breast in female, estrogen receptor negative ([place])   02/01/2018 Initial Diagnosis    Screening detected right breast mass in the upper inner quadrant measuring 1.5 cm, with additional enhancement it measured 2.2 cm.  No axillary lymph nodes.  Biopsy revealed IDC with DCIS, grade 3, ER 0%, PR 0%, HER-2 negative, Ki-67 80%, T1CN0 stage Ib clinical stage      02/20/2018 Genetic Testing    BRCA1 c.2722G>T pathogenic mutation and MLH1 c.979C>G and RAD51C c.506T>C VUS identified on the common hereditary cancer panel.  The Hereditary Gene Panel offered by [facility] includes sequencing and/or deletion duplication testing of the following 47 genes: APC, ATM, AXIN2, BARD1, BMPR1A, BRCA1, BRCA2, BRIP1, CDH1, CDK4, CDKN2A (p14ARF), CDKN2A (p16INK4a), CHEK2, CTNNA1, DICER1, EPCAM (Deletion/duplication testing only), GREM1 (promoter region deletion/duplication testing only), KIT, MEN1, MLH1, MSH2, MSH3, MSH6, MUTYH, NBN, NF1, NHTL1, PALB2, PDGFRA, PMS2, POLD1, POLE, PTEN, RAD50, RAD51C, RAD51D, SDHB, SDHC, SDHD, SMAD4, SMARCA4. STK11, TP53, TSC1, TSC2, and VHL.  The following genes were evaluated for sequence changes only: SDHA and HOXB13 c.251G>A variant only. The report date is February 20, 2018.       02/20/2018 -  Neo-Adjuvant Chemotherapy    Neoadjuvant dose dense Adriamycin and Cytoxan x4 followed by Taxol and carboplatin       CHIEF COMPLIANT: Abraxane cycle 7  INTERVAL HISTORY: TYWANDA RICE is a 32 year old with above-mentioned history of triple negative right breast cancer who is here to receive cycle 7 of Abraxane.  Also supposed to receive carboplatin which is going to be held because  of thrombocytopenia.  Since last week she has felt markedly better.  Energy levels have improved.  We had to reduce the dosage of last chemotherapy.  REVIEW OF SYSTEMS:   Constitutional: Denies fevers, chills or abnormal weight loss Eyes: Denies blurriness of vision Ears, nose, mouth, throat, and face: Denies mucositis or sore throat Respiratory: Denies cough, dyspnea or wheezes Cardiovascular: Denies palpitation, chest discomfort Gastrointestinal:  Denies nausea, heartburn or change in bowel habits Skin: Denies abnormal skin rashes Lymphatics: Denies new lymphadenopathy or easy bruising Neurological:Denies numbness, tingling or new weaknesses Behavioral/Psych: Mood is stable, no new changes  Extremities: No lower extremity edema  All other systems were reviewed with the patient and are negative.  I have reviewed the past medical history, past surgical history, social history and family history with the patient and they are unchanged from previous note.  ALLERGIES:  is allergic to lidocaine.  MEDICATIONS:  Current Outpatient Medications  Medication Sig Dispense Refill  . acetaminophen (TYLENOL) 500 MG tablet Take 1,000 mg by mouth daily as needed for mild pain.    Marland Kitchen dexamethasone (DECADRON) 0.1 % ophthalmic solution Place 2 drops into both eyes every 4 (four) hours. 5 mL 3  . dexamethasone (DECADRON) 4 MG tablet Take 4 mg by mouth daily after breakfast.    . famotidine (PEPCID) 40 MG tablet Take 1 tablet (40 mg total) by mouth daily for 14 days. 14 tablet 0  . fluconazole (DIFLUCAN) 100 MG tablet Take 1 tablet (100 mg total) by mouth daily. 7 tablet 2  . fluticasone (FLONASE) 50 MCG/ACT nasal spray Place 2 sprays into both nostrils daily.    Marland Kitchen  gabapentin (NEURONTIN) 300 MG capsule Take 1 capsule (300 mg total) by mouth 3 (three) times daily. 30 capsule 0  . gentamicin (GARAMYCIN) 0.3 % ophthalmic solution Place 2 drops into both eyes every 4 (four) hours. Continue drops for 3 days  after symptoms improve. (Patient not taking: Reported on 04/28/2018) 5 mL 0  . ibuprofen (ADVIL,MOTRIN) 600 MG tablet Take 1 tablet (600 mg total) by mouth 3 (three) times daily. (Patient not taking: Reported on 04/28/2018) 30 tablet 0  . loratadine (CLARITIN) 10 MG tablet Take 1 tablet (10 mg total) by mouth daily. 30 tablet 0  . LORazepam (ATIVAN) 0.5 MG tablet Take 1 tablet (0.5 mg total) by mouth at bedtime. 30 tablet 1  . metroNIDAZOLE (METROGEL) 1 % gel Apply topically daily. 45 g 1  . montelukast (SINGULAIR) 10 MG tablet Take 1 tablet (10 mg total) by mouth at bedtime. 30 tablet 0  . pantoprazole (PROTONIX) 40 MG tablet Take 1 tablet (40 mg total) by mouth daily. 30 tablet 3  . promethazine (PHENERGAN) 25 MG tablet Take 0.5 tablets (12.5 mg total) by mouth every 6 (six) hours as needed for nausea or vomiting. (Patient not taking: Reported on 04/05/2018) 120 tablet 0   No current facility-administered medications for this visit.    Facility-Administered Medications Ordered in Other Visits  Medication Dose Route Frequency Provider Last Rate Last Dose  . 0.9 %  sodium chloride infusion   Intravenous Once Nicholas Lose, MD      . heparin lock flush 100 unit/mL  500 Units Intracatheter Once PRN Nicholas Lose, MD      . sodium chloride flush (NS) 0.9 % injection 10 mL  10 mL Intracatheter PRN Nicholas Lose, MD        PHYSICAL EXAMINATION: ECOG PERFORMANCE STATUS: 1 - Symptomatic but completely ambulatory  Vitals:   05/29/18 1122  BP: 113/79  Pulse: 77  Resp: 18  Temp: 98.8 F (37.1 C)  SpO2: 100%   Filed Weights   05/29/18 1122  Weight: 133 lb 4.8 oz (60.5 kg)    GENERAL:alert, no distress and comfortable SKIN: skin color, texture, turgor are normal, no rashes or significant lesions EYES: normal, Conjunctiva are pink and non-injected, sclera clear OROPHARYNX:no exudate, no erythema and lips, buccal mucosa, and tongue normal  NECK: supple, thyroid normal size, non-tender, without  nodularity LYMPH:  no palpable lymphadenopathy in the cervical, axillary or inguinal LUNGS: clear to auscultation and percussion with normal breathing effort HEART: regular rate & rhythm and no murmurs and no lower extremity edema ABDOMEN:abdomen soft, non-tender and normal bowel sounds MUSCULOSKELETAL:no cyanosis of digits and no clubbing  NEURO: alert & oriented x 3 with fluent speech, no focal motor/sensory deficits EXTREMITIES: No lower extremity edema   LABORATORY DATA:  I have reviewed the data as listed CMP Latest Ref Rng & Units 05/29/2018 05/22/2018 05/15/2018  Glucose 70 - 140 mg/dL 103 94 99  BUN 7 - 26 mg/dL _0 Creatinine 0.60 - 1.10 mg/dL 0.66 0.62 0.60  Sodium 136 - 145 mmol/L 141 140 140  Potassium 3.5 - 5.1 mmol/L 3.4(L) 3.5 3.4(L)  Chloride 98 - 109 mmol/L 104 105 105  CO2 22 - 29 mmol/L _1 Calcium 8.4 - 10.4 mg/dL 9.8 9.4 9.5  Total Protein 6.4 - 8.3 g/dL 6.8 6.6 6.6  Total Bilirubin 0.2 - 1.2 mg/dL 0.4 0.3 0.3  Alkaline Phos 40 - 150 U/L 70 56 56  AST 5 - 34 U/L 28  31 32  ALT 0 - 55 U/L 47 60(H) 64(H)    Lab Results  Component Value Date   WBC 3.1 (L) 05/29/2018   HGB 9.4 (L) 05/29/2018   HCT 27.8 (L) 05/29/2018   MCV 95.5 05/29/2018   PLT 88 (L) 05/29/2018   NEUTROABS 1.7 05/29/2018    ASSESSMENT & PLAN:  Malignant neoplasm of upper-inner quadrant of right breast in female, estrogen receptor negative ([place]) 02/01/2018:Screening detected right breast mass in the upper inner quadrant measuring 1.5 cm, with additional enhancement it measured 2.2 cm.  No axillary lymph nodes.  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  Genetic counseling and testing 2. neoadjuvant chemotherapy with dose dense Adriamycin and Cytoxan every 2 weeks x4 followed by Taxol weekly x12 (changed to Abraxane due to rash)  accompanied by carboplatin every 3 weeks x4 3. Followed by surgical  options dependent on whether she has genetic mutation. 4.  Adjuvant radiation if she undergoes breast conserving surgery CT CAP and bone scan: Negative for metastases ---------------------------------------------------------------------------------------------------------------------------------  Current treatment:  Completed 4 cycles of Adriamycin and Cytoxan;  Abraxane cycle 7 and Carboplatin (Carbo given every 3 weeks with AUC of 6)  Toxicities: 1.  Thrombocytopenia: Platelet count only 86.  We will not give carboplatin today.  She will receive Abraxane. 2. anemia: Slight improvement than before. 3.  Sinus congestion Improvement in neutropenia.  Next week we will plan on giving carboplatin. Patient understands fully well that if her counts are not adequate then we may not be able to do treatment next week.        No orders of the defined types were placed in this encounter.  The patient has a good understanding of the overall plan. she agrees with it. she will call with any problems that may develop before the next visit here.   Harriette Ohara, MD 05/29/18

## 2018-05-29 NOTE — Patient Instructions (Signed)
[facility] Discharge Instructions for Patients Receiving Chemotherapy   Today you received the following chemotherapy agents Abraxane.   To help prevent nausea and vomiting after your treatment, we encourage you to take your nausea medicationas prescribed.  If you develop nausea and vomiting that is not controlled by your nausea medication, call the clinic.   BELOW ARE SYMPTOMS THAT SHOULD BE REPORTED IMMEDIATELY:  *FEVER GREATER THAN 100.5 F  *CHILLS WITH OR WITHOUT FEVER  NAUSEA AND VOMITING THAT IS NOT CONTROLLED WITH YOUR NAUSEA MEDICATION  *UNUSUAL SHORTNESS OF BREATH  *UNUSUAL BRUISING OR BLEEDING  TENDERNESS IN MOUTH AND THROAT WITH OR WITHOUT PRESENCE OF ULCERS  *URINARY PROBLEMS  *BOWEL PROBLEMS  UNUSUAL RASH Items with * indicate a potential emergency and should be followed up as soon as possible.  Feel free to call the clinic should you have any questions or concerns. The clinic phone number is [phone].  Please show the CHEMO ALERT CARD at check-in to the Emergency Department and triage nurse.  

## 2018-06-05 ENCOUNTER — Encounter: Payer: Self-pay | Admitting: Hematology and Oncology

## 2018-06-05 ENCOUNTER — Inpatient Hospital Stay: Payer: BLUE CROSS/BLUE SHIELD

## 2018-06-05 ENCOUNTER — Ambulatory Visit: Payer: BLUE CROSS/BLUE SHIELD

## 2018-06-05 VITALS — BP 113/68 | HR 78 | Temp 98.9°F | Resp 18 | Wt 132.5 lb

## 2018-06-05 DIAGNOSIS — Z171 Estrogen receptor negative status [ER-]: Principal | ICD-10-CM

## 2018-06-05 DIAGNOSIS — R0981 Nasal congestion: Secondary | ICD-10-CM | POA: Diagnosis not present

## 2018-06-05 DIAGNOSIS — Z79899 Other long term (current) drug therapy: Secondary | ICD-10-CM | POA: Diagnosis not present

## 2018-06-05 DIAGNOSIS — D709 Neutropenia, unspecified: Secondary | ICD-10-CM | POA: Diagnosis not present

## 2018-06-05 DIAGNOSIS — C50211 Malignant neoplasm of upper-inner quadrant of right female breast: Secondary | ICD-10-CM

## 2018-06-05 DIAGNOSIS — D696 Thrombocytopenia, unspecified: Secondary | ICD-10-CM | POA: Diagnosis not present

## 2018-06-05 DIAGNOSIS — C50011 Malignant neoplasm of nipple and areola, right female breast: Secondary | ICD-10-CM

## 2018-06-05 DIAGNOSIS — D649 Anemia, unspecified: Secondary | ICD-10-CM | POA: Diagnosis not present

## 2018-06-05 DIAGNOSIS — Z5111 Encounter for antineoplastic chemotherapy: Secondary | ICD-10-CM | POA: Diagnosis not present

## 2018-06-05 LAB — CBC WITH DIFFERENTIAL (CANCER CENTER ONLY)
Basophils Absolute: 0 10*3/uL (ref 0.0–0.1)
Basophils Relative: 1 %
Eosinophils Absolute: 0 10*3/uL (ref 0.0–0.5)
Eosinophils Relative: 1 %
HEMATOCRIT: 28.1 % — AB (ref 34.8–46.6)
Hemoglobin: 9.8 g/dL — ABNORMAL LOW (ref 11.6–15.9)
LYMPHS ABS: 0.9 10*3/uL (ref 0.9–3.3)
LYMPHS PCT: 36 %
MCH: 33.2 pg (ref 25.1–34.0)
MCHC: 34.7 g/dL (ref 31.5–36.0)
MCV: 95.5 fL (ref 79.5–101.0)
MONO ABS: 0.2 10*3/uL (ref 0.1–0.9)
Monocytes Relative: 6 %
NEUTROS ABS: 1.5 10*3/uL (ref 1.5–6.5)
Neutrophils Relative %: 56 %
Platelet Count: 102 10*3/uL — ABNORMAL LOW (ref 145–400)
RBC: 2.94 MIL/uL — AB (ref 3.70–5.45)
RDW: 16.3 % — ABNORMAL HIGH (ref 11.2–14.5)
WBC: 2.6 10*3/uL — AB (ref 3.9–10.3)

## 2018-06-05 LAB — CMP (CANCER CENTER ONLY)
ALBUMIN: 4.5 g/dL (ref 3.5–5.0)
ALT: 34 U/L (ref 0–55)
ANION GAP: 7 (ref 3–11)
AST: 23 U/L (ref 5–34)
Alkaline Phosphatase: 62 U/L (ref 40–150)
BUN: 7 mg/dL (ref 7–26)
CHLORIDE: 104 mmol/L (ref 98–109)
CO2: 29 mmol/L (ref 22–29)
Calcium: 9.8 mg/dL (ref 8.4–10.4)
Creatinine: 0.68 mg/dL (ref 0.60–1.10)
GFR, Est AFR Am: >60 mL/min (ref >60)
GFR, Estimated: >60 mL/min (ref >60)
GLUCOSE: 97 mg/dL (ref 70–140)
POTASSIUM: 3.5 mmol/L (ref 3.5–5.1)
SODIUM: 140 mmol/L (ref 136–145)
Total Bilirubin: 0.5 mg/dL (ref 0.2–1.2)
Total Protein: 7 g/dL (ref 6.4–8.3)

## 2018-06-05 MED ORDER — PACLITAXEL PROTEIN-BOUND CHEMO INJECTION 100 MG
50.0000 mg/m2 | Freq: Once | INTRAVENOUS | Status: AC
  Administered 2018-06-05: 75 mg via INTRAVENOUS
  Filled 2018-06-05: qty 15

## 2018-06-05 MED ORDER — SODIUM CHLORIDE 0.9 % IV SOLN
458.8000 mg | Freq: Once | INTRAVENOUS | Status: AC
  Administered 2018-06-05: 460 mg via INTRAVENOUS
  Filled 2018-06-05: qty 46

## 2018-06-05 MED ORDER — SODIUM CHLORIDE 0.9 % IV SOLN
Freq: Once | INTRAVENOUS | Status: AC
  Administered 2018-06-05: 13:00:00 via INTRAVENOUS
  Filled 2018-06-05: qty 5

## 2018-06-05 MED ORDER — SODIUM CHLORIDE 0.9% FLUSH
10.0000 mL | Freq: Once | INTRAVENOUS | Status: AC
  Administered 2018-06-05: 10 mL
  Filled 2018-06-05: qty 10

## 2018-06-05 MED ORDER — SODIUM CHLORIDE 0.9 % IV SOLN
Freq: Once | INTRAVENOUS | Status: AC
  Administered 2018-06-05: 13:00:00 via INTRAVENOUS

## 2018-06-05 MED ORDER — HEPARIN SOD (PORK) LOCK FLUSH 100 UNIT/ML IV SOLN
500.0000 [IU] | Freq: Once | INTRAVENOUS | Status: AC | PRN
  Administered 2018-06-05: 500 [IU]
  Filled 2018-06-05: qty 5

## 2018-06-05 MED ORDER — SODIUM CHLORIDE 0.9% FLUSH
10.0000 mL | INTRAVENOUS | Status: DC | PRN
  Administered 2018-06-05: 10 mL
  Filled 2018-06-05: qty 10

## 2018-06-05 NOTE — Patient Instructions (Signed)
[facility] Discharge Instructions for Patients Receiving Chemotherapy  Today you received the following chemotherapy agents Abraxane and Carboplatin   To help prevent nausea and vomiting after your treatment, we encourage you to take your nausea medication as directed.   If you develop nausea and vomiting that is not controlled by your nausea medication, call the clinic.   BELOW ARE SYMPTOMS THAT SHOULD BE REPORTED IMMEDIATELY:  *FEVER GREATER THAN 100.5 F  *CHILLS WITH OR WITHOUT FEVER  NAUSEA AND VOMITING THAT IS NOT CONTROLLED WITH YOUR NAUSEA MEDICATION  *UNUSUAL SHORTNESS OF BREATH  *UNUSUAL BRUISING OR BLEEDING  TENDERNESS IN MOUTH AND THROAT WITH OR WITHOUT PRESENCE OF ULCERS  *URINARY PROBLEMS  *BOWEL PROBLEMS  UNUSUAL RASH Items with * indicate a potential emergency and should be followed up as soon as possible.  Feel free to call the clinic should you have any questions or concerns. The clinic phone number is [phone].  Please show the CHEMO ALERT CARD at check-in to the Emergency Department and triage nurse.   

## 2018-06-05 NOTE — Progress Notes (Signed)
Pt is approved for the $1000 [employer] grant.  

## 2018-06-06 ENCOUNTER — Telehealth: Payer: Self-pay | Admitting: Hematology and Oncology

## 2018-06-06 NOTE — Telephone Encounter (Signed)
Pt came in to scheduling 6/12 request that 7/5 appts be moved up earlier in the mornign - per VG okay with r/s - called patient and unable to reach pt or leave message. Left note on next appt .

## 2018-06-11 ENCOUNTER — Telehealth: Payer: Self-pay | Admitting: Hematology and Oncology

## 2018-06-11 NOTE — Telephone Encounter (Signed)
FAXED RECORDS TO [place] ID [id number]

## 2018-06-12 ENCOUNTER — Inpatient Hospital Stay: Payer: BLUE CROSS/BLUE SHIELD

## 2018-06-12 ENCOUNTER — Inpatient Hospital Stay (HOSPITAL_BASED_OUTPATIENT_CLINIC_OR_DEPARTMENT_OTHER): Payer: BLUE CROSS/BLUE SHIELD | Admitting: Hematology and Oncology

## 2018-06-12 DIAGNOSIS — Z171 Estrogen receptor negative status [ER-]: Principal | ICD-10-CM

## 2018-06-12 DIAGNOSIS — C50211 Malignant neoplasm of upper-inner quadrant of right female breast: Secondary | ICD-10-CM

## 2018-06-12 DIAGNOSIS — Z79899 Other long term (current) drug therapy: Secondary | ICD-10-CM | POA: Diagnosis not present

## 2018-06-12 DIAGNOSIS — D649 Anemia, unspecified: Secondary | ICD-10-CM | POA: Diagnosis not present

## 2018-06-12 DIAGNOSIS — D709 Neutropenia, unspecified: Secondary | ICD-10-CM | POA: Diagnosis not present

## 2018-06-12 DIAGNOSIS — D696 Thrombocytopenia, unspecified: Secondary | ICD-10-CM | POA: Diagnosis not present

## 2018-06-12 DIAGNOSIS — R0981 Nasal congestion: Secondary | ICD-10-CM | POA: Diagnosis not present

## 2018-06-12 DIAGNOSIS — C50011 Malignant neoplasm of nipple and areola, right female breast: Secondary | ICD-10-CM

## 2018-06-12 DIAGNOSIS — Z5111 Encounter for antineoplastic chemotherapy: Secondary | ICD-10-CM | POA: Diagnosis not present

## 2018-06-12 LAB — CMP (CANCER CENTER ONLY)
ALBUMIN: 4.4 g/dL (ref 3.5–5.0)
ALK PHOS: 61 U/L (ref 40–150)
ALT: 27 U/L (ref 0–55)
ANION GAP: 6 (ref 3–11)
AST: 20 U/L (ref 5–34)
BUN: 7 mg/dL (ref 7–26)
CO2: 27 mmol/L (ref 22–29)
Calcium: 9.7 mg/dL (ref 8.4–10.4)
Chloride: 105 mmol/L (ref 98–109)
Creatinine: 0.65 mg/dL (ref 0.60–1.10)
GFR, Est AFR Am: >60 mL/min (ref >60)
GFR, Estimated: >60 mL/min (ref >60)
GLUCOSE: 95 mg/dL (ref 70–140)
POTASSIUM: 3.7 mmol/L (ref 3.5–5.1)
Sodium: 138 mmol/L (ref 136–145)
Total Bilirubin: 0.3 mg/dL (ref 0.2–1.2)
Total Protein: 6.9 g/dL (ref 6.4–8.3)

## 2018-06-12 LAB — CBC WITH DIFFERENTIAL (CANCER CENTER ONLY)
Basophils Absolute: 0 10*3/uL (ref 0.0–0.1)
Basophils Relative: 0 %
EOS PCT: 2 %
Eosinophils Absolute: 0.1 10*3/uL (ref 0.0–0.5)
HEMATOCRIT: 27.7 % — AB (ref 34.8–46.6)
Hemoglobin: 9.7 g/dL — ABNORMAL LOW (ref 11.6–15.9)
LYMPHS PCT: 37 %
Lymphs Abs: 1 10*3/uL (ref 0.9–3.3)
MCH: 33.5 pg (ref 25.1–34.0)
MCHC: 35 g/dL (ref 31.5–36.0)
MCV: 95.8 fL (ref 79.5–101.0)
MONO ABS: 0.2 10*3/uL (ref 0.1–0.9)
MONOS PCT: 6 %
NEUTROS ABS: 1.5 10*3/uL (ref 1.5–6.5)
Neutrophils Relative %: 55 %
Platelet Count: 175 10*3/uL (ref 145–400)
RBC: 2.89 MIL/uL — ABNORMAL LOW (ref 3.70–5.45)
RDW: 15.7 % — AB (ref 11.2–14.5)
WBC Count: 2.7 10*3/uL — ABNORMAL LOW (ref 3.9–10.3)

## 2018-06-12 MED ORDER — SODIUM CHLORIDE 0.9 % IV SOLN
Freq: Once | INTRAVENOUS | Status: AC
  Administered 2018-06-12: 12:00:00 via INTRAVENOUS

## 2018-06-12 MED ORDER — HEPARIN SOD (PORK) LOCK FLUSH 100 UNIT/ML IV SOLN
500.0000 [IU] | Freq: Once | INTRAVENOUS | Status: AC | PRN
  Administered 2018-06-12: 500 [IU]
  Filled 2018-06-12: qty 5

## 2018-06-12 MED ORDER — VENLAFAXINE HCL ER 37.5 MG PO CP24: 37.5000 mg | ORAL_CAPSULE | Freq: Every day | ORAL | 2 refills | Status: DC

## 2018-06-12 MED ORDER — SODIUM CHLORIDE 0.9% FLUSH
10.0000 mL | Freq: Once | INTRAVENOUS | Status: AC
  Administered 2018-06-12: 10 mL
  Filled 2018-06-12: qty 10

## 2018-06-12 MED ORDER — PACLITAXEL PROTEIN-BOUND CHEMO INJECTION 100 MG
50.0000 mg/m2 | Freq: Once | INTRAVENOUS | Status: AC
  Administered 2018-06-12: 75 mg via INTRAVENOUS
  Filled 2018-06-12: qty 15

## 2018-06-12 MED ORDER — SODIUM CHLORIDE 0.9% FLUSH
10.0000 mL | INTRAVENOUS | Status: DC | PRN
  Administered 2018-06-12: 10 mL
  Filled 2018-06-12: qty 10

## 2018-06-12 NOTE — Progress Notes (Signed)
Patient Care Team: [name], MD as PCP - General (Cardiology)  DIAGNOSIS:  Encounter Diagnosis  Name Primary?  . Malignant neoplasm of upper-inner quadrant of right breast in female, estrogen receptor negative ([place])     SUMMARY OF ONCOLOGIC HISTORY:   Malignant neoplasm of upper-inner quadrant of right breast in female, estrogen receptor negative ([place])   02/01/2018 Initial Diagnosis    Screening detected right breast mass in the upper inner quadrant measuring 1.5 cm, with additional enhancement it measured 2.2 cm.  No axillary lymph nodes.  Biopsy revealed IDC with DCIS, grade 3, ER 0%, PR 0%, HER-2 negative, Ki-67 80%, T1CN0 stage Ib clinical stage      02/20/2018 Genetic Testing    BRCA1 c.2722G>T pathogenic mutation and MLH1 c.979C>G and RAD51C c.506T>C VUS identified on the common hereditary cancer panel.  The Hereditary Gene Panel offered by [facility] includes sequencing and/or deletion duplication testing of the following 47 genes: APC, ATM, AXIN2, BARD1, BMPR1A, BRCA1, BRCA2, BRIP1, CDH1, CDK4, CDKN2A (p14ARF), CDKN2A (p16INK4a), CHEK2, CTNNA1, DICER1, EPCAM (Deletion/duplication testing only), GREM1 (promoter region deletion/duplication testing only), KIT, MEN1, MLH1, MSH2, MSH3, MSH6, MUTYH, NBN, NF1, NHTL1, PALB2, PDGFRA, PMS2, POLD1, POLE, PTEN, RAD50, RAD51C, RAD51D, SDHB, SDHC, SDHD, SMAD4, SMARCA4. STK11, TP53, TSC1, TSC2, and VHL.  The following genes were evaluated for sequence changes only: SDHA and HOXB13 c.251G>A variant only. The report date is February 20, 2018.       02/20/2018 -  Neo-Adjuvant Chemotherapy    Neoadjuvant dose dense Adriamycin and Cytoxan x4 followed by Taxol and carboplatin       CHIEF COMPLIANT: Patient is here to receive Abraxane and carboplatin  INTERVAL HISTORY: JOYCELIN RADLOFF is a 32 year old with above-mentioned history of right breast cancer currently on neoadjuvant chemotherapy and today she is here to receive Abraxane and carboplatin.   Last week carboplatin was given along with Abraxane.  Overall she has continued to have problems with sinus congestion as well as abnormalities in blood counts today which included thrombocytopenia and anemia.  She felt very weak and tired for 1 week after last chemotherapy which included carboplatin.  She has recovered slightly from last week.  Continues to have cold symptoms but no sinus pain or congestion  REVIEW OF SYSTEMS:   Constitutional: Denies fevers, chills or abnormal weight loss Eyes: Denies blurriness of vision Ears, nose, mouth, throat, and face: Sinus congestion Respiratory: Denies cough, dyspnea or wheezes Cardiovascular: Denies palpitation, chest discomfort Gastrointestinal:  Denies nausea, heartburn or change in bowel habits Skin: Denies abnormal skin rashes Lymphatics: Denies new lymphadenopathy or easy bruising Neurological:Denies numbness, tingling or new weaknesses Behavioral/Psych: Mood is stable, no new changes  Extremities: No lower extremity edema Breast:  denies any pain or lumps or nodules in either breasts All other systems were reviewed with the patient and are negative.  I have reviewed the past medical history, past surgical history, social history and family history with the patient and they are unchanged from previous note.  ALLERGIES:  is allergic to lidocaine.  MEDICATIONS:  Current Outpatient Medications  Medication Sig Dispense Refill  . acetaminophen (TYLENOL) 500 MG tablet Take 1,000 mg by mouth daily as needed for mild pain.    . famotidine (PEPCID) 40 MG tablet Take 1 tablet (40 mg total) by mouth daily for 14 days. 14 tablet 0  . fluticasone (FLONASE) 50 MCG/ACT nasal spray Place 2 sprays into both nostrils daily.    Marland Kitchen gabapentin (NEURONTIN) 300 MG capsule Take 1 capsule (  300 mg total) by mouth 3 (three) times daily. 30 capsule 0  . ibuprofen (ADVIL,MOTRIN) 600 MG tablet Take 1 tablet (600 mg total) by mouth 3 (three) times daily. (Patient not  taking: Reported on 04/28/2018) 30 tablet 0  . pantoprazole (PROTONIX) 40 MG tablet Take 1 tablet (40 mg total) by mouth daily. 30 tablet 3  . venlafaxine XR (EFFEXOR-XR) 37.5 MG 24 hr capsule Take 1 capsule (37.5 mg total) by mouth daily with breakfast. 30 capsule 2   No current facility-administered medications for this visit.    Facility-Administered Medications Ordered in Other Visits  Medication Dose Route Frequency Provider Last Rate Last Dose  . sodium chloride flush (NS) 0.9 % injection 10 mL  10 mL Intracatheter PRN Nicholas Lose, MD   10 mL at 06/12/18 1423    PHYSICAL EXAMINATION: ECOG PERFORMANCE STATUS: 1 - Symptomatic but completely ambulatory  Vitals:   06/12/18 1117  BP: 130/64  Pulse: 79  Resp: 18  Temp: 98.7 F (37.1 C)  SpO2: 100%   Filed Weights   06/12/18 1117  Weight: 131 lb 9.6 oz (59.7 kg)    GENERAL:alert, no distress and comfortable SKIN: skin color, texture, turgor are normal, no rashes or significant lesions EYES: normal, Conjunctiva are pink and non-injected, sclera clear OROPHARYNX:no exudate, no erythema and lips, buccal mucosa, and tongue normal  NECK: supple, thyroid normal size, non-tender, without nodularity LYMPH:  no palpable lymphadenopathy in the cervical, axillary or inguinal LUNGS: clear to auscultation and percussion with normal breathing effort HEART: regular rate & rhythm and no murmurs and no lower extremity edema ABDOMEN:abdomen soft, non-tender and normal bowel sounds MUSCULOSKELETAL:no cyanosis of digits and no clubbing  NEURO: alert & oriented x 3 with fluent speech, no focal motor/sensory deficits EXTREMITIES: No lower extremity edema  LABORATORY DATA:  I have reviewed the data as listed CMP Latest Ref Rng & Units 06/12/2018 06/05/2018 05/29/2018  Glucose 70 - 140 mg/dL 95 97 103  BUN 7 - 26 mg/dL 7 7 9   Creatinine 0.60 - 1.10 mg/dL 0.65 0.68 0.66  Sodium 136 - 145 mmol/L 138 140 141  Potassium 3.5 - 5.1 mmol/L 3.7 3.5 3.4(L)   Chloride 98 - 109 mmol/L 105 104 104  CO2 22 - 29 mmol/L 27 29 28   Calcium 8.4 - 10.4 mg/dL 9.7 9.8 9.8  Total Protein 6.4 - 8.3 g/dL 6.9 7.0 6.8  Total Bilirubin 0.2 - 1.2 mg/dL 0.3 0.5 0.4  Alkaline Phos 40 - 150 U/L 61 62 70  AST 5 - 34 U/L 20 23 28   ALT 0 - 55 U/L 27 34 47    Lab Results  Component Value Date   WBC 2.7 (L) 06/12/2018   HGB 9.7 (L) 06/12/2018   HCT 27.7 (L) 06/12/2018   MCV 95.8 06/12/2018   PLT 175 06/12/2018   NEUTROABS 1.5 06/12/2018    ASSESSMENT & PLAN:  Malignant neoplasm of upper-inner quadrant of right breast in female, estrogen receptor negative ([place]) 02/01/2018:Screening detected right breast mass in the upper inner quadrant measuring 1.5 cm, with additional enhancement it measured 2.2 cm. No axillary lymph nodes. 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 Genetic counseling and testing 2. neoadjuvant chemotherapy with dose dense Adriamycin and Cytoxan every 2 weeks x4 followed by Taxol weekly x12 (changed to Abraxane due to rash) accompanied by carboplatin every 3 weeks x4 3.  Genetics negative so the  patient could get breast conserving surgery 4. Adjuvant radiation if she undergoes breast conserving surgery CT CAP and bone scan: Negative for metastases ---------------------------------------------------------------------------------------------------------------------------------  Current treatment: Completed 4 cycles of Adriamycin and Cytoxan;  Abraxane cycle 7 and Carboplatin (Carbo given every 3 weeks with AUC of 6)   Toxicities: 1.  Thrombocytopenia: Platelet count has improved spontaneously 2. anemia: Slight improvement than before. 3.  Sinus congestion 4. Improvement in neutropenia. Return to clinic weekly for chemo and every 2 weeks for follow-up with me   No orders of the defined types were placed in this encounter.  The patient has a good  understanding of the overall plan. she agrees with it. she will call with any problems that may develop before the next visit here.   Harriette Ohara, MD 06/12/18

## 2018-06-12 NOTE — Assessment & Plan Note (Signed)
02/01/2018:Screening detected right breast mass in the upper inner quadrant measuring 1.5 cm, with additional enhancement it measured 2.2 cm. No axillary lymph nodes. 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 Genetic counseling and testing 2. neoadjuvant chemotherapy with dose dense Adriamycin and Cytoxan every 2 weeks x4 followed by Taxol weekly x12 (changed to Abraxane due to rash) accompanied by carboplatin every 3 weeks x4 3. Followed by surgical options dependent on whether she has genetic mutation. 4. Adjuvant radiation if she undergoes breast conserving surgery CT CAP and bone scan: Negative for metastases ---------------------------------------------------------------------------------------------------------------------------------  Current treatment: Completed 4 cycles of Adriamycin and Cytoxan;  Abraxane cycle 7 and Carboplatin (Carbo given every 3 weeks with AUC of 6)   Toxicities: 1.  Thrombocytopenia: Platelet count only 86.  We will not give carboplatin today.  She will receive Abraxane. 2. anemia: Slight improvement than before. 3.  Sinus congestion Improvement in neutropenia.

## 2018-06-12 NOTE — Patient Instructions (Signed)
[facility] Discharge Instructions for Patients Receiving Chemotherapy   Today you received the following chemotherapy agents Abraxane.   To help prevent nausea and vomiting after your treatment, we encourage you to take your nausea medicationas prescribed.  If you develop nausea and vomiting that is not controlled by your nausea medication, call the clinic.   BELOW ARE SYMPTOMS THAT SHOULD BE REPORTED IMMEDIATELY:  *FEVER GREATER THAN 100.5 F  *CHILLS WITH OR WITHOUT FEVER  NAUSEA AND VOMITING THAT IS NOT CONTROLLED WITH YOUR NAUSEA MEDICATION  *UNUSUAL SHORTNESS OF BREATH  *UNUSUAL BRUISING OR BLEEDING  TENDERNESS IN MOUTH AND THROAT WITH OR WITHOUT PRESENCE OF ULCERS  *URINARY PROBLEMS  *BOWEL PROBLEMS  UNUSUAL RASH Items with * indicate a potential emergency and should be followed up as soon as possible.  Feel free to call the clinic should you have any questions or concerns. The clinic phone number is [phone].  Please show the CHEMO ALERT CARD at check-in to the Emergency Department and triage nurse.  

## 2018-06-19 ENCOUNTER — Inpatient Hospital Stay: Payer: BLUE CROSS/BLUE SHIELD

## 2018-06-19 VITALS — BP 93/57 | HR 78 | Temp 98.5°F | Resp 16 | Ht 61.0 in | Wt 131.0 lb

## 2018-06-19 DIAGNOSIS — R0981 Nasal congestion: Secondary | ICD-10-CM | POA: Diagnosis not present

## 2018-06-19 DIAGNOSIS — D696 Thrombocytopenia, unspecified: Secondary | ICD-10-CM | POA: Diagnosis not present

## 2018-06-19 DIAGNOSIS — Z5111 Encounter for antineoplastic chemotherapy: Secondary | ICD-10-CM | POA: Diagnosis not present

## 2018-06-19 DIAGNOSIS — C50011 Malignant neoplasm of nipple and areola, right female breast: Secondary | ICD-10-CM

## 2018-06-19 DIAGNOSIS — D649 Anemia, unspecified: Secondary | ICD-10-CM | POA: Diagnosis not present

## 2018-06-19 DIAGNOSIS — C50211 Malignant neoplasm of upper-inner quadrant of right female breast: Secondary | ICD-10-CM

## 2018-06-19 DIAGNOSIS — Z171 Estrogen receptor negative status [ER-]: Principal | ICD-10-CM

## 2018-06-19 DIAGNOSIS — D709 Neutropenia, unspecified: Secondary | ICD-10-CM | POA: Diagnosis not present

## 2018-06-19 DIAGNOSIS — Z79899 Other long term (current) drug therapy: Secondary | ICD-10-CM | POA: Diagnosis not present

## 2018-06-19 LAB — CBC WITH DIFFERENTIAL (CANCER CENTER ONLY)
Basophils Absolute: 0 10*3/uL (ref 0.0–0.1)
Basophils Relative: 1 %
EOS ABS: 0 10*3/uL (ref 0.0–0.5)
Eosinophils Relative: 1 %
HEMATOCRIT: 28 % — AB (ref 34.8–46.6)
HEMOGLOBIN: 9.6 g/dL — AB (ref 11.6–15.9)
LYMPHS ABS: 0.9 10*3/uL (ref 0.9–3.3)
LYMPHS PCT: 36 %
MCH: 33.3 pg (ref 25.1–34.0)
MCHC: 34.3 g/dL (ref 31.5–36.0)
MCV: 97.1 fL (ref 79.5–101.0)
MONOS PCT: 10 %
Monocytes Absolute: 0.2 10*3/uL (ref 0.1–0.9)
NEUTROS PCT: 52 %
Neutro Abs: 1.3 10*3/uL — ABNORMAL LOW (ref 1.5–6.5)
Platelet Count: 201 10*3/uL (ref 145–400)
RBC: 2.88 MIL/uL — AB (ref 3.70–5.45)
RDW: 16.1 % — ABNORMAL HIGH (ref 11.2–14.5)
WBC Count: 2.5 10*3/uL — ABNORMAL LOW (ref 3.9–10.3)

## 2018-06-19 LAB — CMP (CANCER CENTER ONLY)
ALK PHOS: 60 U/L (ref 38–126)
ALT: 39 U/L (ref 0–44)
AST: 24 U/L (ref 15–41)
Albumin: 4.4 g/dL (ref 3.5–5.0)
Anion gap: 6 (ref 5–15)
BILIRUBIN TOTAL: 0.3 mg/dL (ref 0.3–1.2)
BUN: 9 mg/dL (ref 6–20)
CALCIUM: 9.9 mg/dL (ref 8.9–10.3)
CO2: 29 mmol/L (ref 22–32)
CREATININE: 0.63 mg/dL (ref 0.44–1.00)
Chloride: 105 mmol/L (ref 98–111)
Glucose, Bld: 97 mg/dL (ref 70–99)
Potassium: 3.8 mmol/L (ref 3.5–5.1)
SODIUM: 140 mmol/L (ref 135–145)
TOTAL PROTEIN: 6.9 g/dL (ref 6.5–8.1)

## 2018-06-19 MED ORDER — SODIUM CHLORIDE 0.9% FLUSH
10.0000 mL | Freq: Once | INTRAVENOUS | Status: AC
  Administered 2018-06-19: 10 mL
  Filled 2018-06-19: qty 10

## 2018-06-19 MED ORDER — HEPARIN SOD (PORK) LOCK FLUSH 100 UNIT/ML IV SOLN
500.0000 [IU] | Freq: Once | INTRAVENOUS | Status: AC | PRN
  Administered 2018-06-19: 500 [IU]
  Filled 2018-06-19: qty 5

## 2018-06-19 MED ORDER — ACETAMINOPHEN 325 MG PO TABS
ORAL_TABLET | ORAL | Status: AC
  Filled 2018-06-19: qty 2

## 2018-06-19 MED ORDER — SODIUM CHLORIDE 0.9% FLUSH
10.0000 mL | INTRAVENOUS | Status: DC | PRN
  Administered 2018-06-19: 10 mL
  Filled 2018-06-19: qty 10

## 2018-06-19 MED ORDER — PACLITAXEL PROTEIN-BOUND CHEMO INJECTION 100 MG
50.0000 mg/m2 | Freq: Once | INTRAVENOUS | Status: AC
  Administered 2018-06-19: 75 mg via INTRAVENOUS
  Filled 2018-06-19: qty 15

## 2018-06-19 MED ORDER — ACETAMINOPHEN 325 MG PO TABS
650.0000 mg | ORAL_TABLET | Freq: Once | ORAL | Status: AC
  Administered 2018-06-19: 650 mg via ORAL

## 2018-06-19 MED ORDER — SODIUM CHLORIDE 0.9 % IV SOLN
Freq: Once | INTRAVENOUS | Status: AC
  Administered 2018-06-19: 13:00:00 via INTRAVENOUS

## 2018-06-19 NOTE — Patient Instructions (Signed)
[facility] Discharge Instructions for Patients Receiving Chemotherapy  Today you received the following chemotherapy agents:  Abraxane (protein bound paclitaxel)  To help prevent nausea and vomiting after your treatment, we encourage you to take your nausea medication as prescribed.   If you develop nausea and vomiting that is not controlled by your nausea medication, call the clinic.   BELOW ARE SYMPTOMS THAT SHOULD BE REPORTED IMMEDIATELY:  *FEVER GREATER THAN 100.5 F  *CHILLS WITH OR WITHOUT FEVER  NAUSEA AND VOMITING THAT IS NOT CONTROLLED WITH YOUR NAUSEA MEDICATION  *UNUSUAL SHORTNESS OF BREATH  *UNUSUAL BRUISING OR BLEEDING  TENDERNESS IN MOUTH AND THROAT WITH OR WITHOUT PRESENCE OF ULCERS  *URINARY PROBLEMS  *BOWEL PROBLEMS  UNUSUAL RASH Items with * indicate a potential emergency and should be followed up as soon as possible.  Feel free to call the clinic should you have any questions or concerns. The clinic phone number is [phone].  Please show the [place] at check-in to the Emergency Department and triage nurse.

## 2018-06-19 NOTE — Progress Notes (Signed)
I s/w Dr. Lindi Adie re: ANC = 1.3 & he ok'd to proceed w/ tx today. Kennith Center, Pharm.D., CPP 06/19/2018@12 :40 PM

## 2018-06-28 ENCOUNTER — Inpatient Hospital Stay (HOSPITAL_BASED_OUTPATIENT_CLINIC_OR_DEPARTMENT_OTHER): Payer: BLUE CROSS/BLUE SHIELD | Admitting: Hematology and Oncology

## 2018-06-28 ENCOUNTER — Ambulatory Visit: Payer: BLUE CROSS/BLUE SHIELD

## 2018-06-28 ENCOUNTER — Telehealth: Payer: Self-pay

## 2018-06-28 ENCOUNTER — Other Ambulatory Visit: Payer: BLUE CROSS/BLUE SHIELD

## 2018-06-28 ENCOUNTER — Inpatient Hospital Stay: Payer: BLUE CROSS/BLUE SHIELD

## 2018-06-28 ENCOUNTER — Encounter: Payer: Self-pay | Admitting: *Deleted

## 2018-06-28 ENCOUNTER — Ambulatory Visit: Payer: BLUE CROSS/BLUE SHIELD | Admitting: Hematology and Oncology

## 2018-06-28 ENCOUNTER — Inpatient Hospital Stay: Payer: BLUE CROSS/BLUE SHIELD | Attending: Genetic Counselor

## 2018-06-28 DIAGNOSIS — N6459 Other signs and symptoms in breast: Secondary | ICD-10-CM

## 2018-06-28 DIAGNOSIS — D709 Neutropenia, unspecified: Secondary | ICD-10-CM

## 2018-06-28 DIAGNOSIS — Z1509 Genetic susceptibility to other malignant neoplasm: Secondary | ICD-10-CM | POA: Insufficient documentation

## 2018-06-28 DIAGNOSIS — D649 Anemia, unspecified: Secondary | ICD-10-CM

## 2018-06-28 DIAGNOSIS — D696 Thrombocytopenia, unspecified: Secondary | ICD-10-CM | POA: Diagnosis not present

## 2018-06-28 DIAGNOSIS — Z1501 Genetic susceptibility to malignant neoplasm of breast: Secondary | ICD-10-CM | POA: Diagnosis not present

## 2018-06-28 DIAGNOSIS — C50211 Malignant neoplasm of upper-inner quadrant of right female breast: Secondary | ICD-10-CM

## 2018-06-28 DIAGNOSIS — Z171 Estrogen receptor negative status [ER-]: Secondary | ICD-10-CM

## 2018-06-28 DIAGNOSIS — C50011 Malignant neoplasm of nipple and areola, right female breast: Secondary | ICD-10-CM

## 2018-06-28 LAB — COMPREHENSIVE METABOLIC PANEL
ALBUMIN: 4.5 g/dL (ref 3.5–5.0)
ALK PHOS: 51 U/L (ref 38–126)
ALT: 36 U/L (ref 0–44)
AST: 26 U/L (ref 15–41)
Anion gap: 7 (ref 5–15)
BUN: 10 mg/dL (ref 6–20)
CALCIUM: 9.5 mg/dL (ref 8.9–10.3)
CO2: 29 mmol/L (ref 22–32)
CREATININE: 0.49 mg/dL (ref 0.44–1.00)
Chloride: 105 mmol/L (ref 98–111)
GFR calc Af Amer: >60 mL/min (ref >60)
GFR calc non Af Amer: >60 mL/min (ref >60)
Glucose, Bld: 120 mg/dL — ABNORMAL HIGH (ref 70–99)
Potassium: 3.5 mmol/L (ref 3.5–5.1)
SODIUM: 141 mmol/L (ref 135–145)
Total Bilirubin: 0.3 mg/dL (ref 0.3–1.2)
Total Protein: 7 g/dL (ref 6.5–8.1)

## 2018-06-28 LAB — CBC WITH DIFFERENTIAL (CANCER CENTER ONLY)
Basophils Absolute: 0 10*3/uL (ref 0.0–0.1)
Basophils Relative: 0 %
EOS ABS: 0 10*3/uL (ref 0.0–0.5)
EOS PCT: 1 %
HCT: 30.4 % — ABNORMAL LOW (ref 34.8–46.6)
Hemoglobin: 10.6 g/dL — ABNORMAL LOW (ref 11.6–15.9)
LYMPHS ABS: 0.8 10*3/uL — AB (ref 0.9–3.3)
Lymphocytes Relative: 25 %
MCH: 33.8 pg (ref 25.1–34.0)
MCHC: 34.8 g/dL (ref 31.5–36.0)
MCV: 97 fL (ref 79.5–101.0)
MONO ABS: 0.2 10*3/uL (ref 0.1–0.9)
Monocytes Relative: 8 %
Neutro Abs: 2 10*3/uL (ref 1.5–6.5)
Neutrophils Relative %: 66 %
PLATELETS: 144 10*3/uL — AB (ref 145–400)
RBC: 3.13 MIL/uL — AB (ref 3.70–5.45)
RDW: 16 % — ABNORMAL HIGH (ref 11.2–14.5)
WBC: 3 10*3/uL — AB (ref 3.9–10.3)

## 2018-06-28 MED ORDER — SODIUM CHLORIDE 0.9% FLUSH
10.0000 mL | INTRAVENOUS | Status: DC | PRN
  Administered 2018-06-28: 10 mL
  Filled 2018-06-28: qty 10

## 2018-06-28 MED ORDER — SODIUM CHLORIDE 0.9% FLUSH
10.0000 mL | Freq: Once | INTRAVENOUS | Status: AC
  Administered 2018-06-28: 10 mL
  Filled 2018-06-28: qty 10

## 2018-06-28 MED ORDER — PACLITAXEL PROTEIN-BOUND CHEMO INJECTION 100 MG
50.0000 mg/m2 | Freq: Once | INTRAVENOUS | Status: AC
  Administered 2018-06-28: 75 mg via INTRAVENOUS
  Filled 2018-06-28: qty 15

## 2018-06-28 MED ORDER — SODIUM CHLORIDE 0.9 % IV SOLN
Freq: Once | INTRAVENOUS | Status: AC
  Administered 2018-06-28: 10:00:00 via INTRAVENOUS

## 2018-06-28 MED ORDER — HEPARIN SOD (PORK) LOCK FLUSH 100 UNIT/ML IV SOLN
500.0000 [IU] | Freq: Once | INTRAVENOUS | Status: AC | PRN
  Administered 2018-06-28: 500 [IU]
  Filled 2018-06-28: qty 5

## 2018-06-28 NOTE — Progress Notes (Signed)
Patient Care Team: [name], MD as PCP - General (Cardiology)  DIAGNOSIS:  Encounter Diagnosis  Name Primary?  . Malignant neoplasm of upper-inner quadrant of right breast in female, estrogen receptor negative ([place])     SUMMARY OF ONCOLOGIC HISTORY:   Malignant neoplasm of upper-inner quadrant of right breast in female, estrogen receptor negative ([place])   02/01/2018 Initial Diagnosis    Screening detected right breast mass in the upper inner quadrant measuring 1.5 cm, with additional enhancement it measured 2.2 cm.  No axillary lymph nodes.  Biopsy revealed IDC with DCIS, grade 3, ER 0%, PR 0%, HER-2 negative, Ki-67 80%, T1CN0 stage Ib clinical stage      02/20/2018 Genetic Testing    BRCA1 c.2722G>T pathogenic mutation and MLH1 c.979C>G and RAD51C c.506T>C VUS identified on the common hereditary cancer panel.  The Hereditary Gene Panel offered by [facility] includes sequencing and/or deletion duplication testing of the following 47 genes: APC, ATM, AXIN2, BARD1, BMPR1A, BRCA1, BRCA2, BRIP1, CDH1, CDK4, CDKN2A (p14ARF), CDKN2A (p16INK4a), CHEK2, CTNNA1, DICER1, EPCAM (Deletion/duplication testing only), GREM1 (promoter region deletion/duplication testing only), KIT, MEN1, MLH1, MSH2, MSH3, MSH6, MUTYH, NBN, NF1, NHTL1, PALB2, PDGFRA, PMS2, POLD1, POLE, PTEN, RAD50, RAD51C, RAD51D, SDHB, SDHC, SDHD, SMAD4, SMARCA4. STK11, TP53, TSC1, TSC2, and VHL.  The following genes were evaluated for sequence changes only: SDHA and HOXB13 c.251G>A variant only. The report date is February 20, 2018.       02/20/2018 -  Neo-Adjuvant Chemotherapy    Neoadjuvant dose dense Adriamycin and Cytoxan x4 followed by Taxol and carboplatin       CHIEF COMPLIANT: Cycle 9 Abraxane  INTERVAL HISTORY: [name] is a 32 year old with above-mentioned history of triple negative right breast cancer currently neoadjuvant chemotherapy and today is cycle 9 of Abraxane.  She has tolerated chemotherapy reasonably well.   She continues to have upper respiratory symptoms but they have been stable.  Denies any fevers or chills.  Denies any cough or expectoration. Patient is feeling much better from the upper respiratory symptoms but she felt thickening off the right breast and she felt that this was a new finding and wanted to be checked out.  REVIEW OF SYSTEMS:   Constitutional: Denies fevers, chills or abnormal weight loss Eyes: Denies blurriness of vision Ears, nose, mouth, throat, and face: Denies mucositis or sore throat Respiratory: Denies cough, dyspnea or wheezes Cardiovascular: Denies palpitation, chest discomfort Gastrointestinal:  Denies nausea, heartburn or change in bowel habits Skin: Denies abnormal skin rashes Lymphatics: Denies new lymphadenopathy or easy bruising Neurological:Denies numbness, tingling or new weaknesses Behavioral/Psych: Mood is stable, no new changes  Extremities: No lower extremity edema   All other systems were reviewed with the patient and are negative.  I have reviewed the past medical history, past surgical history, social history and family history with the patient and they are unchanged from previous note.  ALLERGIES:  is allergic to lidocaine.  MEDICATIONS:  Current Outpatient Medications  Medication Sig Dispense Refill  . acetaminophen (TYLENOL) 500 MG tablet Take 1,000 mg by mouth daily as needed for mild pain.    . famotidine (PEPCID) 40 MG tablet Take 1 tablet (40 mg total) by mouth daily for 14 days. 14 tablet 0  . fluticasone (FLONASE) 50 MCG/ACT nasal spray Place 2 sprays into both nostrils daily.    Marland Kitchen gabapentin (NEURONTIN) 300 MG capsule Take 1 capsule (300 mg total) by mouth 3 (three) times daily. 30 capsule 0  . ibuprofen (ADVIL,MOTRIN) 600 MG  tablet Take 1 tablet (600 mg total) by mouth 3 (three) times daily. (Patient not taking: Reported on 04/28/2018) 30 tablet 0  . pantoprazole (PROTONIX) 40 MG tablet Take 1 tablet (40 mg total) by mouth daily. 30  tablet 3  . venlafaxine XR (EFFEXOR-XR) 37.5 MG 24 hr capsule Take 1 capsule (37.5 mg total) by mouth daily with breakfast. 30 capsule 2   No current facility-administered medications for this visit.     PHYSICAL EXAMINATION: ECOG PERFORMANCE STATUS: 1 - Symptomatic but completely ambulatory  Vitals:   06/28/18 0818  BP: (!) 109/50  Pulse: 77  Resp: 18  Temp: 99 F (37.2 C)  SpO2: 100%   Filed Weights   06/28/18 0818  Weight: 136 lb 12.8 oz (62.1 kg)    GENERAL:alert, no distress and comfortable SKIN: skin color, texture, turgor are normal, no rashes or significant lesions EYES: normal, Conjunctiva are pink and non-injected, sclera clear OROPHARYNX:no exudate, no erythema and lips, buccal mucosa, and tongue normal  NECK: supple, thyroid normal size, non-tender, without nodularity LYMPH:  no palpable lymphadenopathy in the cervical, axillary or inguinal LUNGS: clear to auscultation and percussion with normal breathing effort HEART: regular rate & rhythm and no murmurs and no lower extremity edema ABDOMEN:abdomen soft, non-tender and normal bowel sounds MUSCULOSKELETAL:no cyanosis of digits and no clubbing  NEURO: alert & oriented x 3 with fluent speech, no focal motor/sensory deficits EXTREMITIES: No lower extremity edema   LABORATORY DATA:  I have reviewed the data as listed CMP Latest Ref Rng & Units 06/19/2018 06/12/2018 06/05/2018  Glucose 70 - 99 mg/dL 97 95 97  BUN 6 - 20 mg/dL _0 Creatinine 0.44 - 1.00 mg/dL 0.63 0.65 0.68  Sodium 135 - 145 mmol/L 140 138 140  Potassium 3.5 - 5.1 mmol/L 3.8 3.7 3.5  Chloride 98 - 111 mmol/L 105 105 104  CO2 22 - 32 mmol/L _1 Calcium 8.9 - 10.3 mg/dL 9.9 9.7 9.8  Total Protein 6.5 - 8.1 g/dL 6.9 6.9 7.0  Total Bilirubin 0.3 - 1.2 mg/dL 0.3 0.3 0.5  Alkaline Phos 38 - 126 U/L 60 61 62  AST 15 - 41 U/L _2 ALT 0 - 44 U/L 39 27 34    Lab Results  Component Value Date   WBC 3.0 (L) 06/28/2018   HGB 10.6 (L)  06/28/2018   HCT 30.4 (L) 06/28/2018   MCV 97.0 06/28/2018   PLT 144 (L) 06/28/2018   NEUTROABS 2.0 06/28/2018    ASSESSMENT & PLAN:  Malignant neoplasm of upper-inner quadrant of right breast in female, estrogen receptor negative ([place]) 02/01/2018:Screening detected right breast mass in the upper inner quadrant measuring 1.5 cm, with additional enhancement it measured 2.2 cm. No axillary lymph nodes. 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 Genetic counseling and testing 2. neoadjuvant chemotherapy with dose dense Adriamycin and Cytoxan every 2 weeks x4 followed by Taxol weekly x12 (changed to Abraxane due to rash) accompanied by carboplatin every 3 weeks x4 3.  Genetics negative so the patient could get breast conserving surgery 4. Adjuvant radiation if she undergoes breast conserving surgery CT CAP and bone scan: Negative for metastases ---------------------------------------------------------------------------------------------------------------------------------  Current treatment:Completed 4 cycles of Adriamycin and Cytoxan; Abraxane cycle 11and Carboplatin (Carbo given every 3 weeks with AUC of 6)  Toxicities: 1.Thrombocytopenia: Platelet count has improved spontaneously 2.anemia: Slight improvement than before.  3.Sinus congestion 4. Improvement in neutropenia. Return to clinic weekly for chemo and every 2 weeks for follow-up with me  Palpable thickening involving the right breast: I did not feel any lumps or nodules. She will be scheduled for breast MRI after the conclusion of the next week's treatment I requested Dr. Donne Hazel to see her in follow-up and I requested her to be placed on tumor board  Orders Placed This Encounter  Procedures  . MR BREAST BILATERAL W WO CONTRAST INC CAD    Standing Status:   Future    Standing Expiration Date:   08/30/2019    Order Specific  Question:   ** REASON FOR EXAM (FREE TEXT)    Answer:   Post neoadj chemo breast MRI    Order Specific Question:   If indicated for the ordered procedure, I authorize the administration of contrast media per Radiology protocol    Answer:   Yes    Order Specific Question:   What is the patient's sedation requirement?    Answer:   No Sedation    Order Specific Question:   Does the patient have a pacemaker or implanted devices?    Answer:   No    Order Specific Question:   Radiology Contrast Protocol - do NOT remove file path    Answer:   [email or web address]    Order Specific Question:   Preferred imaging location?    Answer:   [facility] (table limit-350 lbs)   The patient has a good understanding of the overall plan. she agrees with it. she will call with any problems that may develop before the next visit here.   Jamie Ohara, MD 06/28/18

## 2018-06-28 NOTE — Assessment & Plan Note (Signed)
02/01/2018:Screening detected right breast mass in the upper inner quadrant measuring 1.5 cm, with additional enhancement it measured 2.2 cm. No axillary lymph nodes. 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 Genetic counseling and testing 2. neoadjuvant chemotherapy with dose dense Adriamycin and Cytoxan every 2 weeks x4 followed by Taxol weekly x12 (changed to Abraxane due to rash) accompanied by carboplatin every 3 weeks x4 3.  Genetics negative so the patient could get breast conserving surgery 4. Adjuvant radiation if she undergoes breast conserving surgery CT CAP and bone scan: Negative for metastases ---------------------------------------------------------------------------------------------------------------------------------  Current treatment:Completed 4 cycles of Adriamycin and Cytoxan; Abraxane cycle 9and Carboplatin (Carbo given every 3 weeks with AUC of 6)  Toxicities: 1.Thrombocytopenia: Platelet count has improved spontaneously 2.anemia: Slight improvement than before. 3.Sinus congestion 4. Improvement in neutropenia. Return to clinic weekly for chemo and every 2 weeks for follow-up with me

## 2018-06-28 NOTE — Telephone Encounter (Signed)
Maggie in pharmacy asked if Dr Lindi Adie wants pt to have Carboplatin today with her pre-meds? Per Dr Lindi Adie, pt is not receiving Carboplatin- notified Maggie in pharmacy.

## 2018-06-28 NOTE — Patient Instructions (Signed)
[facility] Discharge Instructions for Patients Receiving Chemotherapy   Today you received the following chemotherapy agents Abraxane.   To help prevent nausea and vomiting after your treatment, we encourage you to take your nausea medicationas prescribed.  If you develop nausea and vomiting that is not controlled by your nausea medication, call the clinic.   BELOW ARE SYMPTOMS THAT SHOULD BE REPORTED IMMEDIATELY:  *FEVER GREATER THAN 100.5 F  *CHILLS WITH OR WITHOUT FEVER  NAUSEA AND VOMITING THAT IS NOT CONTROLLED WITH YOUR NAUSEA MEDICATION  *UNUSUAL SHORTNESS OF BREATH  *UNUSUAL BRUISING OR BLEEDING  TENDERNESS IN MOUTH AND THROAT WITH OR WITHOUT PRESENCE OF ULCERS  *URINARY PROBLEMS  *BOWEL PROBLEMS  UNUSUAL RASH Items with * indicate a potential emergency and should be followed up as soon as possible.  Feel free to call the clinic should you have any questions or concerns. The clinic phone number is [phone].  Please show the CHEMO ALERT CARD at check-in to the Emergency Department and triage nurse.  

## 2018-06-28 NOTE — Telephone Encounter (Signed)
Tim RN called from infusion to let Dr Lindi Adie know that the [facility] machine is down, can we proceed with treatment today?  Per Dr Lindi Adie, ok to proceed without CMP back yet.  Notified Journalist, newspaper.

## 2018-06-28 NOTE — Progress Notes (Signed)
Ok to treat without CMET per MD

## 2018-07-02 ENCOUNTER — Telehealth: Payer: Self-pay | Admitting: *Deleted

## 2018-07-02 NOTE — Telephone Encounter (Signed)
Spoke to pt regarding sx decision. Pt will have bilateral mastectomy. 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 Pt aware she will receive a call regarding plastic sx appt and appt with gyn onc. Denies further needs at this time.

## 2018-07-03 ENCOUNTER — Telehealth: Payer: Self-pay | Admitting: *Deleted

## 2018-07-03 NOTE — Telephone Encounter (Signed)
Called, left the patient a message to call the office back. Need to set up the patient for a new appt.

## 2018-07-04 ENCOUNTER — Telehealth: Payer: Self-pay | Admitting: *Deleted

## 2018-07-04 NOTE — Telephone Encounter (Signed)
Called and spoke with the patient, appt scheduled for July 17th

## 2018-07-05 ENCOUNTER — Inpatient Hospital Stay: Payer: BLUE CROSS/BLUE SHIELD

## 2018-07-05 ENCOUNTER — Encounter: Payer: Self-pay | Admitting: *Deleted

## 2018-07-05 VITALS — BP 103/70 | HR 67 | Temp 98.9°F | Resp 18

## 2018-07-05 DIAGNOSIS — Z171 Estrogen receptor negative status [ER-]: Principal | ICD-10-CM

## 2018-07-05 DIAGNOSIS — C50211 Malignant neoplasm of upper-inner quadrant of right female breast: Secondary | ICD-10-CM | POA: Diagnosis not present

## 2018-07-05 DIAGNOSIS — C50011 Malignant neoplasm of nipple and areola, right female breast: Secondary | ICD-10-CM

## 2018-07-05 DIAGNOSIS — Z1501 Genetic susceptibility to malignant neoplasm of breast: Secondary | ICD-10-CM | POA: Diagnosis not present

## 2018-07-05 DIAGNOSIS — Z1509 Genetic susceptibility to other malignant neoplasm: Secondary | ICD-10-CM | POA: Diagnosis not present

## 2018-07-05 LAB — CMP (CANCER CENTER ONLY)
ALBUMIN: 4.6 g/dL (ref 3.5–5.0)
ALT: 33 U/L (ref 0–44)
AST: 22 U/L (ref 15–41)
Alkaline Phosphatase: 67 U/L (ref 38–126)
Anion gap: 8 (ref 5–15)
BILIRUBIN TOTAL: 0.4 mg/dL (ref 0.3–1.2)
BUN: 8 mg/dL (ref 6–20)
CHLORIDE: 104 mmol/L (ref 98–111)
CO2: 29 mmol/L (ref 22–32)
CREATININE: 0.69 mg/dL (ref 0.44–1.00)
Calcium: 9.9 mg/dL (ref 8.9–10.3)
GFR, Est AFR Am: >60 mL/min (ref >60)
GLUCOSE: 101 mg/dL — AB (ref 70–99)
POTASSIUM: 3.6 mmol/L (ref 3.5–5.1)
Sodium: 141 mmol/L (ref 135–145)
Total Protein: 7.3 g/dL (ref 6.5–8.1)

## 2018-07-05 LAB — CBC WITH DIFFERENTIAL (CANCER CENTER ONLY)
Basophils Absolute: 0 10*3/uL (ref 0.0–0.1)
Basophils Relative: 0 %
EOS ABS: 0 10*3/uL (ref 0.0–0.5)
Eosinophils Relative: 1 %
HCT: 30.9 % — ABNORMAL LOW (ref 34.8–46.6)
Hemoglobin: 10.7 g/dL — ABNORMAL LOW (ref 11.6–15.9)
LYMPHS ABS: 1.1 10*3/uL (ref 0.9–3.3)
LYMPHS PCT: 26 %
MCH: 32.7 pg (ref 25.1–34.0)
MCHC: 34.6 g/dL (ref 31.5–36.0)
MCV: 94.5 fL (ref 79.5–101.0)
MONO ABS: 0.2 10*3/uL (ref 0.1–0.9)
Monocytes Relative: 6 %
Neutro Abs: 2.7 10*3/uL (ref 1.5–6.5)
Neutrophils Relative %: 67 %
Platelet Count: 175 10*3/uL (ref 145–400)
RBC: 3.27 MIL/uL — ABNORMAL LOW (ref 3.70–5.45)
RDW: 13.8 % (ref 11.2–14.5)
WBC: 4.1 10*3/uL (ref 3.9–10.3)

## 2018-07-05 MED ORDER — SODIUM CHLORIDE 0.9 % IV SOLN
Freq: Once | INTRAVENOUS | Status: AC
  Administered 2018-07-05: 10:00:00 via INTRAVENOUS

## 2018-07-05 MED ORDER — HEPARIN SOD (PORK) LOCK FLUSH 100 UNIT/ML IV SOLN
500.0000 [IU] | Freq: Once | INTRAVENOUS | Status: AC | PRN
  Administered 2018-07-05: 500 [IU]
  Filled 2018-07-05: qty 5

## 2018-07-05 MED ORDER — PACLITAXEL PROTEIN-BOUND CHEMO INJECTION 100 MG
50.0000 mg/m2 | Freq: Once | INTRAVENOUS | Status: AC
  Administered 2018-07-05: 75 mg via INTRAVENOUS
  Filled 2018-07-05: qty 15

## 2018-07-05 MED ORDER — SODIUM CHLORIDE 0.9% FLUSH
10.0000 mL | Freq: Once | INTRAVENOUS | Status: AC
  Administered 2018-07-05: 10 mL
  Filled 2018-07-05: qty 10

## 2018-07-05 MED ORDER — SODIUM CHLORIDE 0.9% FLUSH
10.0000 mL | INTRAVENOUS | Status: DC | PRN
  Administered 2018-07-05: 10 mL
  Filled 2018-07-05: qty 10

## 2018-07-05 NOTE — Patient Instructions (Signed)
[facility] Discharge Instructions for Patients Receiving Chemotherapy  Today you received the following chemotherapy agents:  Abraxane.  To help prevent nausea and vomiting after your treatment, we encourage you to take your nausea medication as directed.   If you develop nausea and vomiting that is not controlled by your nausea medication, call the clinic.   BELOW ARE SYMPTOMS THAT SHOULD BE REPORTED IMMEDIATELY:  *FEVER GREATER THAN 100.5 F  *CHILLS WITH OR WITHOUT FEVER  NAUSEA AND VOMITING THAT IS NOT CONTROLLED WITH YOUR NAUSEA MEDICATION  *UNUSUAL SHORTNESS OF BREATH  *UNUSUAL BRUISING OR BLEEDING  TENDERNESS IN MOUTH AND THROAT WITH OR WITHOUT PRESENCE OF ULCERS  *URINARY PROBLEMS  *BOWEL PROBLEMS  UNUSUAL RASH Items with * indicate a potential emergency and should be followed up as soon as possible.  Feel free to call the clinic should you have any questions or concerns. The clinic phone number is [phone].  Please show the CHEMO ALERT CARD at check-in to the Emergency Department and triage nurse.   

## 2018-07-08 ENCOUNTER — Ambulatory Visit (HOSPITAL_COMMUNITY)
Admission: RE | Admit: 2018-07-08 | Discharge: 2018-07-08 | Disposition: A | Discharge status: Home / Self Care | Payer: BLUE CROSS/BLUE SHIELD | Source: Ambulatory Visit | Attending: Hematology and Oncology | Admitting: Hematology and Oncology

## 2018-07-08 DIAGNOSIS — Z171 Estrogen receptor negative status [ER-]: Secondary | ICD-10-CM | POA: Diagnosis not present

## 2018-07-08 DIAGNOSIS — C50911 Malignant neoplasm of unspecified site of right female breast: Secondary | ICD-10-CM | POA: Diagnosis not present

## 2018-07-08 DIAGNOSIS — C50211 Malignant neoplasm of upper-inner quadrant of right female breast: Secondary | ICD-10-CM | POA: Insufficient documentation

## 2018-07-08 DIAGNOSIS — Z5111 Encounter for antineoplastic chemotherapy: Secondary | ICD-10-CM | POA: Diagnosis not present

## 2018-07-08 MED ORDER — GADOBENATE DIMEGLUMINE 529 MG/ML IV SOLN
15.0000 mL | Freq: Once | INTRAVENOUS | Status: AC | PRN
  Administered 2018-07-08: 12 mL via INTRAVENOUS

## 2018-07-09 ENCOUNTER — Encounter: Payer: Self-pay | Admitting: Gynecologic Oncology

## 2018-07-10 ENCOUNTER — Encounter: Payer: Self-pay | Admitting: Gynecologic Oncology

## 2018-07-10 ENCOUNTER — Inpatient Hospital Stay (HOSPITAL_BASED_OUTPATIENT_CLINIC_OR_DEPARTMENT_OTHER): Payer: BLUE CROSS/BLUE SHIELD | Admitting: Gynecologic Oncology

## 2018-07-10 VITALS — BP 98/63 | HR 80 | Temp 98.5°F | Resp 16 | Ht 61.0 in | Wt 134.4 lb

## 2018-07-10 DIAGNOSIS — Z1509 Genetic susceptibility to other malignant neoplasm: Secondary | ICD-10-CM

## 2018-07-10 DIAGNOSIS — Z1501 Genetic susceptibility to malignant neoplasm of breast: Secondary | ICD-10-CM

## 2018-07-10 DIAGNOSIS — C50211 Malignant neoplasm of upper-inner quadrant of right female breast: Secondary | ICD-10-CM | POA: Diagnosis not present
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
## 2018-07-10 NOTE — Patient Instructions (Signed)
Dr Denman George is recommending risk reducing removal of the tubes and ovaries at age 32.  This is to reduce the risk for ovarian/tubal cancer for which you have a 35% lifetime risk. Removal of the tubes and ovaries premenopausally is associated with increased risk for development of osteoporosis and fractures, and accelerated development of cardiovascular disease and potential for earlier cardiac-related death.  The uterus can be removed at the time of surgery (hysterectomy), though the indication for this is less definitive. Patients with BRCA 1 mutations may have a slightly increased risk for uterine cancer. Patients with MLH1 mutations may also have an increased risk for uterine cancer, though your mutation was of undetermined significance. Hysterectomy, when added to removal of tubes and ovaries, is associated with increased procedural time, infection risk, bleeding risk and risk of damage to the bladder or healing abnormalities at the vagina.  If you desire hysterectomy, this can be added to the scheduled procedure.  If we do not proceed with risk reducing surgery now (coordinated with Dr Cristal Generous office), Dr Denman George will order a transvaginal US to evaluate the ovaries, and will repeat this study every 6 months until definitive surgery to remove the ovaries is performed.   Her office can be reached at [phone] when you have determined how you would like to proceed.

## 2018-07-11 ENCOUNTER — Other Ambulatory Visit: Payer: Self-pay | Admitting: General Surgery

## 2018-07-11 DIAGNOSIS — C50211 Malignant neoplasm of upper-inner quadrant of right female breast: Secondary | ICD-10-CM

## 2018-07-11 DIAGNOSIS — Z171 Estrogen receptor negative status [ER-]: Principal | ICD-10-CM

## 2018-07-12 DIAGNOSIS — C50211 Malignant neoplasm of upper-inner quadrant of right female breast: Secondary | ICD-10-CM | POA: Diagnosis not present

## 2018-07-12 DIAGNOSIS — Z1501 Genetic susceptibility to malignant neoplasm of breast: Secondary | ICD-10-CM | POA: Diagnosis not present

## 2018-07-12 DIAGNOSIS — Z171 Estrogen receptor negative status [ER-]: Secondary | ICD-10-CM | POA: Diagnosis not present

## 2018-07-12 DIAGNOSIS — Z1509 Genetic susceptibility to other malignant neoplasm: Secondary | ICD-10-CM | POA: Diagnosis not present

## 2018-07-12 NOTE — H&P (Signed)
  Subjective:     Patient ID: [name] is a 32 y.o. female.  HPI  Patient of Drs. Donne Hazel and [place] here for consultation breast reconstruction. Presented with palpable right breast mass. MMG/US demonstrated 1.5 cm UIQ right breast mass, with additional enhancement it measured 2.2 cm. No axillary lymph nodes. Biopsy revealed IDC with DCIS, triple negative. MRI demonstrated known malignancy in the upper inner right breast measures 1.8 x 2.2 X 2.2 cm.  Staging scans negative. Completed neoadjuvant chemotherapy 7.15.19. Final MRI with mass measuring 6 x 7 x 7 mm.  Genetics with BRCA1 mutation. Plan bilateral mastectomies.  Current 36 B, goal C cup. Wt stable. Accompanied by husband.  She works as an Chief Technology Officer. 3 daughters including twins.  Review of Systems  Constitutional: Positive for fatigue.  Eyes: Positive for visual disturbance.  Cardiovascular: Positive for palpitations.  Musculoskeletal: Positive for back pain and myalgias.  Hematological: Bruises/bleeds easily.  Psychiatric/Behavioral: Positive for sleep disturbance.   Remainder 12 point review negative    Objective:   Physical Exam  Cardiovascular: Normal rate, regular rhythm and normal heart sounds.   Pulmonary/Chest: Effort normal and breath sounds normal.  Abdominal: Soft.  Redundant tissue present, counseled likely smaller volume reconstruction then present breasts   No ptosis Sn to nipple R 22.5 L 23 cm BW R 14.5 (CW 13) L 14 Nipple to IMF R 10 L 9 cm    Assessment:     Right breast cancer UIQ ER- (triple negative) Neoadjuvant chemotherapy BRCA1    Plan:     Reviewed options prosthesis and provided brochure for [facility] [place]. Discussed immediate vs delayed, autologous vs implant based reconstruction. Reviewed incisions, drains, OR length, hospital stay and recovery for each. Discussed process of expansion and implant based risks including rupture, MRI surveillance for  silicone implants, infection requiring surgery or removal, contracture. Discussed asymmetry one can expect with implant unilateral and natural breast on opposite. Discussed TRAM vs DIEP, latter would require treatment at tertiary center. Discussed abdominal based complications including failure flap, abdominal bulge or hernia. Counseled I feel her abdominal volume would leas to smaller then present breast size. Discussed TE at time mastectomy does not preclude autologous in future. Discussed future surgery dependent on adjuvant treatments.  Reviewed SSM vs NSM, both will be asensate and not stimulate. Reviewed with both risks mastectomy flap necrosis requiring additional surgery. She is candidate for NSM.  Discussed use of acellular dermis in reconstruction, cadaveric source, incorporation over several weeks, risk that if has seroma or infection can act as additional nidus for infection if not incorporated.  Discussed prepectoral vs sub pectoral reconstruction. Discussed with patient and benefit of this is no animation deformity, may be less pain. Risk may be more visible rippling over upper poles, greater need of ADM. Reviewed pre pectoral would require larger amount acellular dermis, more drains. Discussed any type reconstruction also risks long term displacement implant and visible rippling.   Patient had many questions regarding liposcution and reconstruction. Reviewed purpose liposuction andfat grafting both for implant and autologous reconstruction to aid with contour, prevent visible rippling. Counseled her abdomen has redundant skin and liposuction will not create same aesthetic result as abdominoplasty.  Plan for bilateral NSM with immediate prepectoral TE, ADM placement. Rx for [facility] [place] given.  Reviewed portfolio, examples of expanders and implants.   Irene Limbo, MD [facility] [phone], pin [phone]

## 2018-07-16 ENCOUNTER — Telehealth: Payer: Self-pay | Admitting: Hematology and Oncology

## 2018-07-16 NOTE — Telephone Encounter (Signed)
Mailed patient avs and calendar of upcoming aug appts per 7/22 sch message

## 2018-07-17 DIAGNOSIS — Z6825 Body mass index (BMI) 25.0-25.9, adult: Secondary | ICD-10-CM | POA: Diagnosis not present

## 2018-07-17 DIAGNOSIS — H6502 Acute serous otitis media, left ear: Secondary | ICD-10-CM | POA: Diagnosis not present

## 2018-07-19 ENCOUNTER — Other Ambulatory Visit: Payer: Self-pay | Admitting: Gynecologic Oncology

## 2018-07-19 ENCOUNTER — Telehealth: Payer: Self-pay | Admitting: Oncology

## 2018-07-19 DIAGNOSIS — Z1509 Genetic susceptibility to other malignant neoplasm: Principal | ICD-10-CM

## 2018-07-19 DIAGNOSIS — Z1501 Genetic susceptibility to malignant neoplasm of breast: Secondary | ICD-10-CM

## 2018-07-19 NOTE — Telephone Encounter (Signed)
Called [name] to see what her thoughts are about surgery with Dr. Denman George.  She said she has decided to wait until the end of the school year (May or June 2020).  She also asked about setting up an ultrasound of her ovaries that Dr. Denman George had mentioned at her last appointment.  Advised her that we will work on setting up the ultrasound.

## 2018-07-22 ENCOUNTER — Other Ambulatory Visit: Payer: Self-pay

## 2018-07-22 ENCOUNTER — Encounter (HOSPITAL_BASED_OUTPATIENT_CLINIC_OR_DEPARTMENT_OTHER): Payer: Self-pay | Admitting: *Deleted

## 2018-07-22 DIAGNOSIS — Z6825 Body mass index (BMI) 25.0-25.9, adult: Secondary | ICD-10-CM | POA: Diagnosis not present

## 2018-07-22 DIAGNOSIS — C50919 Malignant neoplasm of unspecified site of unspecified female breast: Secondary | ICD-10-CM | POA: Diagnosis not present

## 2018-07-22 DIAGNOSIS — H6503 Acute serous otitis media, bilateral: Secondary | ICD-10-CM | POA: Diagnosis not present

## 2018-07-22 NOTE — Progress Notes (Signed)
Gave pt Ensure pre surgery drink with instruction to drink by 0800 DOS. CHG soap given also with instruction to shower night before and morning of surgery. Pt voiced understanding.

## 2018-07-22 NOTE — Pre-Procedure Instructions (Signed)
To come pick up Ensure pre-surgery drink 10 oz. - to drink by 0800 DOS; to pick up CHG soap to use in shower night before surgery and AM DOS.

## 2018-07-23 ENCOUNTER — Telehealth: Payer: Self-pay | Admitting: Oncology

## 2018-07-23 NOTE — Telephone Encounter (Signed)
Called [name] and notified her of Ultrasound appointment on 07/25/18 at 10:30 with 10:15 arrival.  Also advised her that she will need a full bladder.  She verbalized understanding and agreement.

## 2018-07-25 ENCOUNTER — Ambulatory Visit (HOSPITAL_COMMUNITY)
Admission: RE | Admit: 2018-07-25 | Discharge: 2018-07-25 | Disposition: A | Discharge status: Home / Self Care | Payer: BLUE CROSS/BLUE SHIELD | Source: Ambulatory Visit | Attending: Gynecologic Oncology | Admitting: Gynecologic Oncology

## 2018-07-25 DIAGNOSIS — Z1501 Genetic susceptibility to malignant neoplasm of breast: Secondary | ICD-10-CM | POA: Diagnosis not present

## 2018-07-25 DIAGNOSIS — R9389 Abnormal findings on diagnostic imaging of other specified body structures: Secondary | ICD-10-CM | POA: Diagnosis not present

## 2018-07-25 DIAGNOSIS — Z1509 Genetic susceptibility to other malignant neoplasm: Secondary | ICD-10-CM | POA: Insufficient documentation

## 2018-07-25 DIAGNOSIS — N8500 Endometrial hyperplasia, unspecified: Secondary | ICD-10-CM | POA: Diagnosis not present

## 2018-07-26 ENCOUNTER — Telehealth: Payer: Self-pay | Admitting: *Deleted

## 2018-07-26 NOTE — Telephone Encounter (Signed)
Patient left a message with our office enquiring about the results from her recent ultrasound.  I returned patient's call back to let her know that the results were in but Dr. Denman George has to review them.  I told patient that as soon as the results were reviewed by Dr. Denman George that Dr. Denman George or our office would give her a call. I told the patient that it may be the beginning of next week before the results are reviewed. Patient verbalized understanding.

## 2018-07-29 ENCOUNTER — Telehealth: Payer: Self-pay

## 2018-07-29 ENCOUNTER — Encounter (HOSPITAL_BASED_OUTPATIENT_CLINIC_OR_DEPARTMENT_OTHER): Payer: Self-pay | Admitting: Anesthesiology

## 2018-07-29 ENCOUNTER — Encounter (HOSPITAL_BASED_OUTPATIENT_CLINIC_OR_DEPARTMENT_OTHER)
Admission: RE | Disposition: A | Discharge status: Home / Self Care | Payer: Self-pay | Source: Ambulatory Visit | Attending: General Surgery

## 2018-07-29 ENCOUNTER — Ambulatory Visit (HOSPITAL_BASED_OUTPATIENT_CLINIC_OR_DEPARTMENT_OTHER): Payer: BLUE CROSS/BLUE SHIELD | Admitting: Anesthesiology

## 2018-07-29 ENCOUNTER — Other Ambulatory Visit: Payer: Self-pay

## 2018-07-29 ENCOUNTER — Observation Stay (HOSPITAL_BASED_OUTPATIENT_CLINIC_OR_DEPARTMENT_OTHER)
Admission: RE | Admit: 2018-07-29 | Discharge: 2018-07-30 | Disposition: A | Discharge status: Home / Self Care | Payer: BLUE CROSS/BLUE SHIELD | Source: Ambulatory Visit | Attending: General Surgery | Admitting: General Surgery

## 2018-07-29 ENCOUNTER — Ambulatory Visit (HOSPITAL_COMMUNITY)
Admission: RE | Admit: 2018-07-29 | Discharge: 2018-07-29 | Disposition: A | Discharge status: Home / Self Care | Payer: BLUE CROSS/BLUE SHIELD | Source: Ambulatory Visit | Attending: General Surgery | Admitting: General Surgery

## 2018-07-29 DIAGNOSIS — Z4001 Encounter for prophylactic removal of breast: Secondary | ICD-10-CM | POA: Insufficient documentation

## 2018-07-29 DIAGNOSIS — Z171 Estrogen receptor negative status [ER-]: Principal | ICD-10-CM

## 2018-07-29 DIAGNOSIS — N6031 Fibrosclerosis of right breast: Secondary | ICD-10-CM | POA: Diagnosis not present

## 2018-07-29 DIAGNOSIS — Z1501 Genetic susceptibility to malignant neoplasm of breast: Secondary | ICD-10-CM | POA: Diagnosis not present

## 2018-07-29 DIAGNOSIS — Z7951 Long term (current) use of inhaled steroids: Secondary | ICD-10-CM | POA: Insufficient documentation

## 2018-07-29 DIAGNOSIS — Z803 Family history of malignant neoplasm of breast: Secondary | ICD-10-CM | POA: Diagnosis not present

## 2018-07-29 DIAGNOSIS — Z79899 Other long term (current) drug therapy: Secondary | ICD-10-CM | POA: Diagnosis not present

## 2018-07-29 DIAGNOSIS — C50211 Malignant neoplasm of upper-inner quadrant of right female breast: Secondary | ICD-10-CM | POA: Diagnosis not present

## 2018-07-29 DIAGNOSIS — C50911 Malignant neoplasm of unspecified site of right female breast: Secondary | ICD-10-CM | POA: Diagnosis present

## 2018-07-29 DIAGNOSIS — Z886 Allergy status to analgesic agent status: Secondary | ICD-10-CM | POA: Diagnosis not present

## 2018-07-29 DIAGNOSIS — E039 Hypothyroidism, unspecified: Secondary | ICD-10-CM | POA: Diagnosis not present

## 2018-07-29 DIAGNOSIS — G8918 Other acute postprocedural pain: Secondary | ICD-10-CM | POA: Diagnosis not present

## 2018-07-29 DIAGNOSIS — E119 Type 2 diabetes mellitus without complications: Secondary | ICD-10-CM | POA: Insufficient documentation

## 2018-07-29 DIAGNOSIS — Z1509 Genetic susceptibility to other malignant neoplasm: Principal | ICD-10-CM

## 2018-07-29 HISTORY — DX: Personal history of other specified conditions: Z87.898

## 2018-07-29 HISTORY — DX: Personal history of antineoplastic chemotherapy: Z92.21

## 2018-07-29 HISTORY — PX: BREAST RECONSTRUCTION WITH PLACEMENT OF TISSUE EXPANDER AND ALLODERM: SHX6805

## 2018-07-29 LAB — POCT HEMOGLOBIN-HEMACUE: HEMOGLOBIN: 8.9 g/dL — AB (ref 12.0–15.0)

## 2018-07-29 SURGERY — NIPPLE SPARING MASTECTOMY WITH SENTINEL LYMPH NODE BIOPSY
Anesthesia: General | Site: Breast | Laterality: Bilateral

## 2018-07-29 MED ORDER — MIDAZOLAM HCL 5 MG/5ML IJ SOLN
INTRAMUSCULAR | Status: DC | PRN
  Administered 2018-07-29: 2 mg via INTRAVENOUS

## 2018-07-29 MED ORDER — FENTANYL CITRATE (PF) 100 MCG/2ML IJ SOLN
INTRAMUSCULAR | Status: AC
  Filled 2018-07-29: qty 2

## 2018-07-29 MED ORDER — GABAPENTIN 300 MG PO CAPS
ORAL_CAPSULE | ORAL | Status: AC
  Filled 2018-07-29: qty 1

## 2018-07-29 MED ORDER — LACTATED RINGERS IV BOLUS
1000.0000 mL | Freq: Once | INTRAVENOUS | Status: AC
Start: 2018-07-29 — End: 2018-07-29
  Administered 2018-07-29: 1000 mL via INTRAVENOUS

## 2018-07-29 MED ORDER — SODIUM CHLORIDE 0.9 % IV SOLN
INTRAVENOUS | Status: DC
  Administered 2018-07-29: 19:00:00 via INTRAVENOUS

## 2018-07-29 MED ORDER — BUPIVACAINE-EPINEPHRINE (PF) 0.5% -1:200000 IJ SOLN
INTRAMUSCULAR | Status: DC | PRN
  Administered 2018-07-29: 40 mL

## 2018-07-29 MED ORDER — SULFAMETHOXAZOLE-TRIMETHOPRIM 800-160 MG PO TABS: 1.0000 | ORAL_TABLET | Freq: Two times a day (BID) | ORAL | 0 refills | Status: DC

## 2018-07-29 MED ORDER — METHOCARBAMOL 500 MG PO TABS: 500.0000 mg | ORAL_TABLET | Freq: Three times a day (TID) | ORAL | 0 refills | Status: DC | PRN

## 2018-07-29 MED ORDER — TECHNETIUM TC 99M SULFUR COLLOID FILTERED
1.0000 | Freq: Once | INTRAVENOUS | Status: AC | PRN
  Administered 2018-07-29: 1 via INTRADERMAL

## 2018-07-29 MED ORDER — OXYCODONE HCL 5 MG PO TABS: 5.0000 mg | ORAL_TABLET | ORAL | 0 refills | Status: DC | PRN

## 2018-07-29 MED ORDER — ONDANSETRON HCL 4 MG/2ML IJ SOLN
INTRAMUSCULAR | Status: AC
  Filled 2018-07-29: qty 2

## 2018-07-29 MED ORDER — CEFAZOLIN SODIUM-DEXTROSE 2-4 GM/100ML-% IV SOLN
INTRAVENOUS | Status: AC
  Filled 2018-07-29: qty 100

## 2018-07-29 MED ORDER — GABAPENTIN 300 MG PO CAPS: 300.0000 mg | ORAL_CAPSULE | Freq: Two times a day (BID) | ORAL | Status: DC

## 2018-07-29 MED ORDER — SODIUM CHLORIDE 0.9 % IV SOLN
INTRAVENOUS | Status: DC | PRN
  Administered 2018-07-29: 1000 mL

## 2018-07-29 MED ORDER — CHLORHEXIDINE GLUCONATE CLOTH 2 % EX PADS: 6.0000 | MEDICATED_PAD | Freq: Once | CUTANEOUS | Status: DC

## 2018-07-29 MED ORDER — PHENYLEPHRINE 40 MCG/ML (10ML) SYRINGE FOR IV PUSH (FOR BLOOD PRESSURE SUPPORT)
PREFILLED_SYRINGE | INTRAVENOUS | Status: AC
  Filled 2018-07-29: qty 10

## 2018-07-29 MED ORDER — FENTANYL CITRATE (PF) 100 MCG/2ML IJ SOLN
25.0000 ug | INTRAMUSCULAR | Status: DC | PRN
  Administered 2018-07-29: 25 ug via INTRAVENOUS

## 2018-07-29 MED ORDER — ENSURE PRE-SURGERY PO LIQD: 296.0000 mL | Freq: Once | ORAL | Status: DC

## 2018-07-29 MED ORDER — OXYCODONE HCL 5 MG/5ML PO SOLN: 5.0000 mg | Freq: Once | ORAL | Status: DC | PRN

## 2018-07-29 MED ORDER — MORPHINE SULFATE (PF) 2 MG/ML IV SOLN: 1.0000 mg | INTRAVENOUS | Status: DC | PRN

## 2018-07-29 MED ORDER — ONDANSETRON HCL 4 MG/2ML IJ SOLN: 4.0000 mg | Freq: Four times a day (QID) | INTRAMUSCULAR | Status: DC | PRN

## 2018-07-29 MED ORDER — DEXAMETHASONE SODIUM PHOSPHATE 10 MG/ML IJ SOLN
INTRAMUSCULAR | Status: AC
  Filled 2018-07-29: qty 1

## 2018-07-29 MED ORDER — MIDAZOLAM HCL 2 MG/2ML IJ SOLN
INTRAMUSCULAR | Status: AC
  Filled 2018-07-29: qty 2

## 2018-07-29 MED ORDER — PHENYLEPHRINE HCL 10 MG/ML IJ SOLN
INTRAMUSCULAR | Status: DC | PRN
  Administered 2018-07-29 (×2): 80 ug via INTRAVENOUS

## 2018-07-29 MED ORDER — LORATADINE 10 MG PO TABS: 10.0000 mg | ORAL_TABLET | Freq: Every day | ORAL | Status: DC

## 2018-07-29 MED ORDER — CEFAZOLIN SODIUM-DEXTROSE 2-4 GM/100ML-% IV SOLN
2.0000 g | INTRAVENOUS | Status: AC
  Administered 2018-07-29: 2 g via INTRAVENOUS

## 2018-07-29 MED ORDER — SCOPOLAMINE 1 MG/3DAYS TD PT72: 1.0000 | MEDICATED_PATCH | Freq: Once | TRANSDERMAL | Status: DC | PRN

## 2018-07-29 MED ORDER — FENTANYL CITRATE (PF) 100 MCG/2ML IJ SOLN
INTRAMUSCULAR | Status: DC | PRN
  Administered 2018-07-29: 50 ug via INTRAVENOUS
  Administered 2018-07-29 (×4): 25 ug via INTRAVENOUS

## 2018-07-29 MED ORDER — SODIUM CHLORIDE 0.9 % IJ SOLN
INTRAVENOUS | Status: DC | PRN
  Administered 2018-07-29: 3 mL via INTRAMUSCULAR

## 2018-07-29 MED ORDER — LACTATED RINGERS IV SOLN
INTRAVENOUS | Status: DC
  Administered 2018-07-29 (×3): via INTRAVENOUS

## 2018-07-29 MED ORDER — PROPOFOL 10 MG/ML IV BOLUS
INTRAVENOUS | Status: AC
  Filled 2018-07-29: qty 20

## 2018-07-29 MED ORDER — PROPOFOL 10 MG/ML IV BOLUS
INTRAVENOUS | Status: DC | PRN
  Administered 2018-07-29: 200 mg via INTRAVENOUS

## 2018-07-29 MED ORDER — OXYCODONE HCL 5 MG PO TABS: 5.0000 mg | ORAL_TABLET | Freq: Once | ORAL | Status: DC | PRN

## 2018-07-29 MED ORDER — IBUPROFEN 600 MG PO TABS
600.0000 mg | ORAL_TABLET | Freq: Four times a day (QID) | ORAL | Status: DC | PRN
  Administered 2018-07-29 – 2018-07-30 (×3): 600 mg via ORAL
  Filled 2018-07-29 (×3): qty 1

## 2018-07-29 MED ORDER — METHOCARBAMOL 500 MG PO TABS
500.0000 mg | ORAL_TABLET | Freq: Three times a day (TID) | ORAL | Status: DC
  Administered 2018-07-29: 500 mg via ORAL
  Filled 2018-07-29: qty 1

## 2018-07-29 MED ORDER — PROMETHAZINE HCL 25 MG/ML IJ SOLN: 6.2500 mg | INTRAMUSCULAR | Status: DC | PRN

## 2018-07-29 MED ORDER — OXYCODONE HCL 5 MG PO TABS
5.0000 mg | ORAL_TABLET | ORAL | Status: DC | PRN
  Administered 2018-07-30 (×2): 5 mg via ORAL
  Filled 2018-07-29 (×2): qty 1

## 2018-07-29 MED ORDER — ACETAMINOPHEN 500 MG PO TABS
1000.0000 mg | ORAL_TABLET | ORAL | Status: AC
  Administered 2018-07-29: 1000 mg via ORAL

## 2018-07-29 MED ORDER — ENSURE PRE-SURGERY PO LIQD: 592.0000 mL | Freq: Once | ORAL | Status: DC

## 2018-07-29 MED ORDER — ONDANSETRON 4 MG PO TBDP: 4.0000 mg | ORAL_TABLET | Freq: Four times a day (QID) | ORAL | Status: DC | PRN

## 2018-07-29 MED ORDER — SIMETHICONE 80 MG PO CHEW: 40.0000 mg | CHEWABLE_TABLET | Freq: Four times a day (QID) | ORAL | Status: DC | PRN

## 2018-07-29 MED ORDER — CEFAZOLIN SODIUM-DEXTROSE 2-4 GM/100ML-% IV SOLN
2.0000 g | Freq: Three times a day (TID) | INTRAVENOUS | Status: DC
  Administered 2018-07-29 – 2018-07-30 (×2): 2 g via INTRAVENOUS
  Filled 2018-07-29 (×2): qty 100

## 2018-07-29 MED ORDER — ONDANSETRON HCL 4 MG/2ML IJ SOLN
INTRAMUSCULAR | Status: DC | PRN
  Administered 2018-07-29: 4 mg via INTRAVENOUS

## 2018-07-29 MED ORDER — SODIUM CHLORIDE 0.9 % IV SOLN
INTRAVENOUS | Status: DC
  Administered 2018-07-29 – 2018-07-30 (×2): via INTRAVENOUS

## 2018-07-29 MED ORDER — FENTANYL CITRATE (PF) 100 MCG/2ML IJ SOLN
50.0000 ug | INTRAMUSCULAR | Status: DC | PRN
  Administered 2018-07-29: 100 ug via INTRAVENOUS

## 2018-07-29 MED ORDER — ACETAMINOPHEN 500 MG PO TABS
1000.0000 mg | ORAL_TABLET | Freq: Four times a day (QID) | ORAL | Status: DC
  Administered 2018-07-29 – 2018-07-30 (×3): 1000 mg via ORAL
  Filled 2018-07-29 (×3): qty 2

## 2018-07-29 MED ORDER — GABAPENTIN 300 MG PO CAPS: 300.0000 mg | ORAL_CAPSULE | ORAL | Status: DC

## 2018-07-29 MED ORDER — DEXAMETHASONE SODIUM PHOSPHATE 10 MG/ML IJ SOLN
INTRAMUSCULAR | Status: DC | PRN
  Administered 2018-07-29: 10 mg via INTRAVENOUS

## 2018-07-29 MED ORDER — ACETAMINOPHEN 500 MG PO TABS
ORAL_TABLET | ORAL | Status: AC
  Filled 2018-07-29: qty 2

## 2018-07-29 MED ORDER — KETOROLAC TROMETHAMINE 15 MG/ML IJ SOLN
INTRAMUSCULAR | Status: AC
  Filled 2018-07-29: qty 1

## 2018-07-29 MED ORDER — KETOROLAC TROMETHAMINE 15 MG/ML IJ SOLN
15.0000 mg | INTRAMUSCULAR | Status: AC
  Administered 2018-07-29: 15 mg via INTRAVENOUS

## 2018-07-29 MED ORDER — FLUTICASONE PROPIONATE 50 MCG/ACT NA SUSP: 1.0000 | Freq: Every day | NASAL | Status: DC

## 2018-07-29 MED ORDER — MEPERIDINE HCL 25 MG/ML IJ SOLN: 6.2500 mg | INTRAMUSCULAR | Status: DC | PRN

## 2018-07-29 MED ORDER — MIDAZOLAM HCL 2 MG/2ML IJ SOLN
1.0000 mg | INTRAMUSCULAR | Status: DC | PRN
  Administered 2018-07-29: 2 mg via INTRAVENOUS

## 2018-07-29 SURGICAL SUPPLY — 83 items
ALLODERM 16X20 PERFORATED (Tissue) ×2 IMPLANT
ALLOGRAFT PERF 16X20 1.6+/-0.4 (Tissue) ×2 IMPLANT
APPLIER CLIP 11 MED OPEN (CLIP) ×3
BAG DECANTER FOR FLEXI CONT (MISCELLANEOUS) ×3 IMPLANT
BINDER BREAST LRG (GAUZE/BANDAGES/DRESSINGS) IMPLANT
BINDER BREAST XLRG (GAUZE/BANDAGES/DRESSINGS) ×2 IMPLANT
BLADE SURG 10 STRL SS (BLADE) ×6 IMPLANT
BLADE SURG 15 STRL LF DISP TIS (BLADE) ×2 IMPLANT
BLADE SURG 15 STRL SS (BLADE) ×4
BNDG GAUZE ELAST 4 BULKY (GAUZE/BANDAGES/DRESSINGS) ×4 IMPLANT
CANISTER SUCT 1200ML W/VALVE (MISCELLANEOUS) ×3 IMPLANT
CHLORAPREP W/TINT 26ML (MISCELLANEOUS) ×5 IMPLANT
CLIP APPLIE 11 MED OPEN (CLIP) ×1 IMPLANT
COUNTER NEEDLE 1200 MAGNETIC (NEEDLE) IMPLANT
COVER BACK TABLE 60X90IN (DRAPES) ×3 IMPLANT
COVER MAYO STAND STRL (DRAPES) ×6 IMPLANT
COVER PROBE W GEL 5X96 (DRAPES) ×3 IMPLANT
DERMABOND ADVANCED (GAUZE/BANDAGES/DRESSINGS) ×4
DERMABOND ADVANCED .7 DNX12 (GAUZE/BANDAGES/DRESSINGS) ×2 IMPLANT
DRAIN CHANNEL 15F RND FF W/TCR (WOUND CARE) ×4 IMPLANT
DRAIN CHANNEL 19F RND (DRAIN) ×5 IMPLANT
DRAPE TOP ARMCOVERS (MISCELLANEOUS) ×3 IMPLANT
DRAPE U-SHAPE 76X120 STRL (DRAPES) ×3 IMPLANT
DRAPE UTILITY XL STRL (DRAPES) ×6 IMPLANT
DRSG PAD ABDOMINAL 8X10 ST (GAUZE/BANDAGES/DRESSINGS) ×6 IMPLANT
DRSG TEGADERM 4X10 (GAUZE/BANDAGES/DRESSINGS) IMPLANT
ELECT BLADE 4.0 EZ CLEAN MEGAD (MISCELLANEOUS) ×3
ELECT COATED BLADE 2.86 ST (ELECTRODE) ×3 IMPLANT
ELECT REM PT RETURN 9FT ADLT (ELECTROSURGICAL) ×3
ELECTRODE BLDE 4.0 EZ CLN MEGD (MISCELLANEOUS) ×1 IMPLANT
ELECTRODE REM PT RTRN 9FT ADLT (ELECTROSURGICAL) ×1 IMPLANT
EVACUATOR SILICONE 100CC (DRAIN) ×10 IMPLANT
EXPANDER TISSUE FV FOURTE 400 (Prosthesis & Implant Plastic) IMPLANT
GAUZE SPONGE 4X4 12PLY STRL LF (GAUZE/BANDAGES/DRESSINGS) IMPLANT
GLOVE BIO SURGEON STRL SZ 6 (GLOVE) ×12 IMPLANT
GLOVE BIO SURGEON STRL SZ 6.5 (GLOVE) ×3 IMPLANT
GLOVE BIO SURGEON STRL SZ7 (GLOVE) ×5 IMPLANT
GLOVE BIO SURGEONS STRL SZ 6.5 (GLOVE) ×3
GLOVE BIOGEL PI IND STRL 7.0 (GLOVE) IMPLANT
GLOVE BIOGEL PI IND STRL 7.5 (GLOVE) ×1 IMPLANT
GLOVE BIOGEL PI INDICATOR 7.0 (GLOVE) ×4
GLOVE BIOGEL PI INDICATOR 7.5 (GLOVE) ×2
GOWN STRL REUS W/ TWL LRG LVL3 (GOWN DISPOSABLE) ×4 IMPLANT
GOWN STRL REUS W/TWL LRG LVL3 (GOWN DISPOSABLE) ×8
LIGHT WAVEGUIDE WIDE FLAT (MISCELLANEOUS) ×3 IMPLANT
MARKER SKIN DUAL TIP RULER LAB (MISCELLANEOUS) IMPLANT
NDL HYPO 25X1 1.5 SAFETY (NEEDLE) IMPLANT
NDL SAFETY ECLIPSE 18X1.5 (NEEDLE) IMPLANT
NEEDLE HYPO 18GX1.5 SHARP (NEEDLE) ×2
NEEDLE HYPO 25X1 1.5 SAFETY (NEEDLE) ×3 IMPLANT
NS IRRIG 1000ML POUR BTL (IV SOLUTION) ×3 IMPLANT
PACK BASIN DAY SURGERY FS (CUSTOM PROCEDURE TRAY) ×3 IMPLANT
PENCIL BUTTON HOLSTER BLD 10FT (ELECTRODE) ×3 IMPLANT
PIN SAFETY STERILE (MISCELLANEOUS) ×3 IMPLANT
SHEET MEDIUM DRAPE 40X70 STRL (DRAPES) ×6 IMPLANT
SLEEVE SCD COMPRESS KNEE MED (MISCELLANEOUS) ×3 IMPLANT
SPONGE LAP 18X18 RF (DISPOSABLE) ×12 IMPLANT
SPONGE LAP 4X18 RFD (DISPOSABLE) ×2 IMPLANT
STAPLER VISISTAT 35W (STAPLE) ×3 IMPLANT
SUT CHROMIC 4 0 PS 2 18 (SUTURE) ×12 IMPLANT
SUT ETHILON 2 0 FS 18 (SUTURE) ×5 IMPLANT
SUT MNCRL AB 4-0 PS2 18 (SUTURE) ×6 IMPLANT
SUT SILK 2 0 SH (SUTURE) ×3 IMPLANT
SUT VIC AB 3-0 54X BRD REEL (SUTURE) IMPLANT
SUT VIC AB 3-0 BRD 54 (SUTURE)
SUT VIC AB 3-0 SH 27 (SUTURE) ×4
SUT VIC AB 3-0 SH 27X BRD (SUTURE) IMPLANT
SUT VICRYL 0 CT-2 (SUTURE) ×8 IMPLANT
SUT VICRYL 3-0 CR8 SH (SUTURE) ×3 IMPLANT
SUT VICRYL 4-0 PS2 18IN ABS (SUTURE) ×4 IMPLANT
SUT VLOC 180 0 24IN GS25 (SUTURE) ×4 IMPLANT
SYR 50ML LL SCALE MARK (SYRINGE) ×3 IMPLANT
SYR BULB IRRIGATION 50ML (SYRINGE) ×3 IMPLANT
SYR CONTROL 10ML LL (SYRINGE) ×2 IMPLANT
TISSUE EXPNDR FV FOURTE 400 (Prosthesis & Implant Plastic) ×6 IMPLANT
TOWEL GREEN STERILE FF (TOWEL DISPOSABLE) ×3 IMPLANT
TOWEL OR NON WOVEN STRL DISP B (DISPOSABLE) ×3 IMPLANT
TRAY DSU PREP LF (CUSTOM PROCEDURE TRAY) ×3 IMPLANT
TRAY FOLEY W/BAG SLVR 14FR (SET/KITS/TRAYS/PACK) ×2 IMPLANT
TUBE CONNECTING 20'X1/4 (TUBING) ×1
TUBE CONNECTING 20X1/4 (TUBING) ×2 IMPLANT
UNDERPAD 30X30 (UNDERPADS AND DIAPERS) ×6 IMPLANT
YANKAUER SUCT BULB TIP NO VENT (SUCTIONS) ×3 IMPLANT

## 2018-07-29 NOTE — Op Note (Signed)
Preoperative diagnosis: Clinical stage I right breast cancer, BRCA positive Postoperative diagnosis: Same as above Procedure: 1.  Left risk reducing nipple sparing mastectomy 2.  Right nipple sparing mastectomy 3.  Right deep axillary sentinel lymph node biopsy 4.  Injection of methylene blue dye for sentinel lymph node identification Surgeon: Dr. Serita Grammes Asst: Dr Irene Limbo Anesthesia: General with bilateral pectoral blocks Estimated blood loss: 100 cc Drains: Per plastic surgery Specimens: 1.  Left breast tissue marked short stitch superior, long stitch lateral, double stitch nipple areolar complex 2.  Left nipple specimen 3.  Right nipple sparing mastectomy marked short stitch superior, long stitch lateral, double stitch nipple areolar complex 4.  Right nipple specimen 5.  Right axillary sentinel lymph nodes with highest count of [id number] Complications: None Disposition Case turned over to plastic surgery for reconstruction  Indications: This is a 32 year old female was found to have a right breast cancer.  She has undergone primary chemotherapy.  She was noted to be BRCA positive during this evaluation.  She has had a very good radiologic response to chemotherapy.  We have discussed all of her options and elected to proceed with a bilateral nipple sparing mastectomy, right axillary sentinel lymph node biopsy, and immediate expander reconstruction in the prepectoral position.  Procedure: After informed consent was obtained the fascia first underwent bilateral pectoral blocks.  She was injected with technetium on the right side in the standard periareolar fashion.  She was given antibiotics.  SCDs were in place.  She was placed under general anesthesia without complication.  Her chest was prepped and draped in the standard sterile surgical fashion.  A surgical timeout was then performed.  I did the risk reducing side first.  I made an 11 cm incision 8 cm from the xiphoid process  in the inframammary fold.  I then used cautery to remove the pectoralis fashion the breast tissue from the pectoralis muscle to the clavicle, parasternal region, latissimus.  I then used cautery to remove the breast tissue from the skin flap.  She had a lot of dense breast tissue that was adherent to her skin and I did most of this dissection with the facelift scissors.  I then remove the breast and marked as above.  I then remove the remaining tissue in the nipple after inverting it and sent this off as a separate specimen.  The site was packed and I turned my attention to the other side.  I injected a mixture of methylene blue dye and saline in the subareolar position and massaged this.  This was done for sentinel lymph node identification given her primary chemotherapy I made a similar 11 cm incision about 7-1/2 cm from the xiphoid process due to the location of her tumor.  In the inframammary fold.  I then remove the breast tissue from in the pectoralis fashion from the pectoralis muscle.  Her port remained in place and this was not disturbed.  I then made my anterior skin flap in a similar fashion and removed all of the breast tissue marked as above.  I then remove the nipple specimen and sent this off separately as well.  I then packed this.  I elected to make a separate incision at her axillary hairline in the right axilla.  I carried this through the axillary fascia.  I was able to note several sentinel lymph nodes in a couple that were also blue.  I removed all these and sent them off the table.  There was  no further radioactivity and no more blue dye present.  I then closed this with 2-0 Vicryl, 3-0 Vicryl, and 4-0 Monocryl.  The case was then turned over to plastic surgery for reconstruction.

## 2018-07-29 NOTE — Op Note (Signed)
Operative Note   DATE OF OPERATION: 8.5.19  LOCATION: [facility]-observation  SURGICAL DIVISION: Plastic Surgery  PREOPERATIVE DIAGNOSES:  1. Right breast cancer UIQ ER- 2. BRCA1 mutation  POSTOPERATIVE DIAGNOSES:  same  PROCEDURE:  1. Bilateral breast reconstruction with tissue expanders 2. Acellular dermis (Alloderm) for breast reconstruction 600cm2  SURGEON: Irene Limbo MD MBA  ASSISTANT: none  ANESTHESIA:  General.   EBL: 350 ml for entire procedure  COMPLICATIONS: None immediate.   INDICATIONS FOR PROCEDURE:  The patient, [name], is a 32 y.o. female born on Jun 29, 1986, is here for immediate prepectoral tissue expander acellular dermis reconstruction following nipple sparing mastectomies.   FINDINGS: Natrelle 133SFV-12-T 400 ml tissue expanders placed bilateral, initial fill volume 330 ml air. RIGHT SN [id number] LEFT SN [id number]  DESCRIPTION OF PROCEDURE:  The patient was marked with the patient in the preoperative area to mark sternal notch, chest midline, anterior axillary lines and inframammary folds.Following induction,Foley catheter placed. The patient's operative site was prepped and draped in a sterile fashion. A time out was performed and all information was confirmed to be correct. Following completion of mastectomies, reconstruction began onleft side.  The cavity was irrigated with solution containing Ancef, gentamicin, and bacitracin. Hemostasis was ensured. A 19 Fr drain was placed in subcutaneous position laterally and a 15 Fr drain placed along inframammary fold. Each secured to skin with 2-0 nylon. Cavity irrigated with Betadine. The tissue expanders were prepared on back table prior in insertion. The expander was filled with air to317m.Acellular dermis wasperforated anddraped over anterior surface expander. The ADM was then secured to itself over posterior surface of expander. Redundant folds acellular dermis excised so that the ADM lay  flat without folds over air filled expander.The expander was secured tofascia over lateral sternal borderwith a 0 vicryl. Thelateral tab wasalso secured to pectoralis muscle with 0-vicryl. The ADM was secured to pectoralis muscle and chest wall along inferior border at inframammary foldwith 0 V-lock suture.Laterally the mastectomy flap over posterior axillary line was advanced anteriorly and the subcutaneous tissue and superficial fascia was secured to pectoralis muscle and acellular dermis with 0-vicryl. Skin closure completedwith 3-0 vicryl in fascial layer and 4-0 vicryl in dermis. Skin closure completed with 4-0 monocryl subcuticular and tissue adhesive.  I then directed my attention torightchest where similar irrigation and drain placement completed. The prepared expander with ADM secured over anterior surface was placed in right chest and tabs secured to chest wall and pectoralis muscle with 0- vicryl suture. The acellular dermis at inframammary fold was secured to chest wall with 0 V-lock suture.Laterally the mastectomy flap over posterior axillary line was advanced anteriorly and the subcutaneous tissue and superficial fascia was secured to pectoralis muscle and acellular dermis with 0-vicryl. Skin closure completedwith 3-0 vicryl in fascial layer and 4-0 vicryl in dermis. Skin closure completed with 4-0 monocryl subcuticular and tissue adhesive.The mastectomy flaps were redraped so that NAC was symmetric from midline and sternal notch. Tegaderm dressings applied followed bydry dressing,breast binder.  The patient was allowed to wake from anesthesia, extubated and taken to the recovery room in satisfactory condition.   SPECIMENS: none  DRAINS: 15 and 19 Fr JP in right and left breast reconstruction  BIrene Limbo MD [facility] [phone] pin [phone]

## 2018-07-29 NOTE — Anesthesia Preprocedure Evaluation (Addendum)
Anesthesia Evaluation  Patient identified by MRN, date of birth, ID band Patient awake    Reviewed: Allergy & Precautions, H&P , NPO status , Patient's Chart, lab work & pertinent test results  History of Anesthesia Complications (+) PONV and history of anesthetic complications  Airway Mallampati: II  TM Distance: >3 FB     Dental  (+) Teeth Intact   Pulmonary shortness of breath, former smoker,    breath sounds clear to auscultation       Cardiovascular Exercise Tolerance: Good + dysrhythmias  Rhythm:regular Rate:Normal     Neuro/Psych  Headaches,    GI/Hepatic GERD  ,  Endo/Other  diabetesHyperthyroidism   Renal/GU      Musculoskeletal   Abdominal   Peds  Hematology   Anesthesia Other Findings   Reproductive/Obstetrics (+) Pregnancy                            Anesthesia Physical  Anesthesia Plan  ASA: II  Anesthesia Plan: General   Post-op Pain Management: GA combined w/ Regional for post-op pain   Induction: Intravenous  PONV Risk Score and Plan: 2 and Ondansetron and Dexamethasone  Airway Management Planned: Oral ETT  Additional Equipment:   Intra-op Plan:   Post-operative Plan: Extubation in OR  Informed Consent: I have reviewed the patients History and Physical, chart, labs and discussed the procedure including the risks, benefits and alternatives for the proposed anesthesia with the patient or authorized representative who has indicated his/her understanding and acceptance.   Dental advisory given  Plan Discussed with: CRNA  Anesthesia Plan Comments:         Anesthesia Quick Evaluation

## 2018-07-29 NOTE — Transfer of Care (Signed)
Immediate Anesthesia Transfer of Care Note  Patient: [name]  Procedure(s) Performed: BILATERAL NIPPLE SPARING MASTECTOMIES WITH RIGHT  AXILLARY SENTINEL LYMPH NODE BIOPSY (Bilateral Breast) BILATERAL BREAST RECONSTRUCTION WITH PLACEMENT OF TISSUE EXPANDER AND ALLODERM (Bilateral Breast)  Patient Location: PACU  Anesthesia Type:GA combined with regional for post-op pain  Level of Consciousness: awake, alert  and drowsy  Airway & Oxygen Therapy: Patient Spontanous Breathing and Patient connected to face mask oxygen  Post-op Assessment: Report given to RN and Post -op Vital signs reviewed and stable  Post vital signs: Reviewed and stable  Last Vitals:  Vitals Value Taken Time  BP    Temp    Pulse 94 07/29/2018  4:45 PM  Resp 18 07/29/2018  4:45 PM  SpO2 100 % 07/29/2018  4:45 PM  Vitals shown include unvalidated device data.  Last Pain:  Vitals:   07/29/18 1039  TempSrc: Oral  PainSc: 0-No pain         Complications: No apparent anesthesia complications

## 2018-07-29 NOTE — Progress Notes (Signed)
Giving RN shift report and patient said that she needed to use the restroom. Patient said that she felt sick and hot. Patients blood pressure 63/34 while lying in bed. Patient very sweaty and had a very weak pulse. MD called and notified. Plans are to start a bolus and increase IV fluids. Drains are within appropriate limits. BP is now 80/50. Will continue to closely monitor patient. Setzer, Marchelle Folks

## 2018-07-29 NOTE — Progress Notes (Signed)
Assisted Dr. Lissa Hoard with right, left, ultrasound guided, pectoralis block. Side rails up, monitors on throughout procedure. See vital signs in flow sheet. Tolerated Procedure well.

## 2018-07-29 NOTE — H&P (Signed)
5 yof I saw before for right breast cancer. she has history in a paternal aunt of brca 1 e908 mutation. no nipple discharge. she has three girls. 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 she had a mm that shows c density breasts. there was a mass in ruiq. US shows a 1.5x1.3x1.2 cm mass with adjacent abnormality measuring 2.2x2.2x1.5 cm. there is no axillary adenopathy. 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 she now has undergone primary chemotherapy. she has a brca 1 mutation. she has done well with chemo. her repeat mr at end of chemotherapy shows only a 6x7 mm residual disease. she does not feel a mass now.   Past Surgical History Rolm Bookbinder, MD; 07/11/2018 6:26 PM) Breast Biopsy  Right. Cesarean Section - Multiple  Gallbladder Surgery - Laparoscopic   Diagnostic Studies History Rolm Bookbinder, MD; 07/11/2018 6:26 PM) Colonoscopy  never  Allergies Sabino Gasser; 07/11/2018 2:31 PM) Local Anesthetics (Ester - Lidocaine)  Allergies Reconciled   Medication History Sabino Gasser; 07/11/2018 2:31 PM) Gabapentin (300MG Capsule, Oral) Active. Medications Reconciled  Social History Rolm Bookbinder, MD; 07/11/2018 6:26 PM) Alcohol use  Remotely quit alcohol use. Caffeine use  Carbonated beverages. No drug use  Tobacco use  Current some day smoker.  Family History Rolm Bookbinder, MD; 07/11/2018 6:26 PM) Alcohol Abuse  Father. Breast Cancer  Family Members In General. Thyroid problems  Brother.  ROS pos for fatigue and hair loss otherwise negative  Vitals Sabino Gasser; 07/11/2018 2:32 PM) 07/11/2018 2:31 PM Weight: 134 lb Height: 60in Body Surface Area: 1.57 m Body Mass Index: 26.17 kg/m  Temp.: 98.18F(Oral)  Pulse: 86 (Regular)  BP: 112/70 (Sitting, Left Arm, Standard) Physical Exam Rolm Bookbinder MD; 07/11/2018 6:18 PM) General Mental Status-Alert. Orientation-Oriented  X3. Head and Neck Trachea-midline. Thyroid Gland Characteristics - normal size and consistency. Eye Sclera/Conjunctiva - Bilateral-No scleral icterus. Chest and Lung Exam Chest and lung exam reveals -quiet, even and easy respiratory effort with no use of accessory muscles and on auscultation, normal breath sounds, no adventitious sounds and normal vocal resonance. Breast Nipples-No Discharge. Breast Lump-No Palpable Breast Mass. Cardiovascular Cardiovascular examination reveals -normal heart sounds, regular rate and rhythm with no murmurs. Lymphatic Head & Neck General Head & Neck Lymphatics: Bilateral - Description - Normal. Axillary General Axillary Region: Bilateral - Description - Normal. Note: no [place] adenopathy   Assessment & Plan Rolm Bookbinder MD; 07/11/2018 6:26 PM) BREAST CANCER OF UPPER-INNER QUADRANT OF RIGHT FEMALE BREAST (C50.211) Story: Right nsm, risk reducing left nsm, right axillary sn biopsy we discussed options including lumpectomy/sn with mri follow up. I recommended bilateral nsm.we discussed nsm bilaterally with sn on right side. We discussed a sentinel lymph node biopsy as she does not appear to having lymph node involvement right now. We discussed the performance of that with injection of radioactive tracer and blue dye. We discussed that she would have an incision underneath her axillary hairline or would be done through the same incision. We discussed that there is a chance of having a positive node with a sentinel lymph node biopsy and we will await the permanent pathology to make any other first further decisions in terms of her treatment. One of these options might be to return to the operating room to perform an axillary lymph node dissection. We discussed up to a 5% risk lifetime of chronic shoulder pain as well as lymphedema associated with a sentinel lymph node biopsy. NSM  is safe oncologically and ok for her. we discussed asensate nipple,  skin loss, nipple and areolar loss as well as pos nipple requiring excision. She sees plastic surgery tomorrow and will plan surgery from there somewhere within four weeks of chemotherapy completion.

## 2018-07-29 NOTE — Discharge Instructions (Signed)
[facility] °[phone] ° °MASTECTOMY: POST OP INSTRUCTIONS ° °Always review your discharge instruction sheet given to you by the facility where your surgery was performed. °IF YOU HAVE DISABILITY OR FAMILY LEAVE FORMS, YOU MUST BRING THEM TO THE OFFICE FOR PROCESSING.   °DO NOT GIVE THEM TO YOUR DOCTOR. °A prescription for pain medication may be given to you upon discharge.  Take your pain medication as prescribed, if needed.  If narcotic pain medicine is not needed, then you may take acetaminophen (Tylenol), naprosyn (Alleve) or ibuprofen (Advil) as needed. °1. Take your usually prescribed medications unless otherwise directed. °2. If you need a refill on your pain medication, please contact your pharmacy.  They will contact our office to request authorization.  Prescriptions will not be filled after 5pm or on week-ends. °3. You should follow a light diet the first few days after arrival home, such as soup and crackers, etc.  Resume your normal diet the day after surgery. °4. Most patients will experience some swelling and bruising on the chest and underarm.  Ice packs will help.  Swelling and bruising can take several days to resolve. Wear the binder day and night until you return to the office.  °5. It is common to experience some constipation if taking pain medication after surgery.  Increasing fluid intake and taking a stool softener (such as Colace) will usually help or prevent this problem from occurring.  A mild laxative (Milk of Magnesia or Miralax) should be taken according to package instructions if there are no bowel movements after 48 hours. °6. Unless discharge instructions indicate otherwise, leave your bandage dry and in place until your next appointment in 3-5 days.  You may take a limited sponge bath.  No tube baths or showers until the drains are removed.  You may have steri-strips (small skin tapes) in place directly over the incision.  These strips should be left on the  skin for 7-10 days. If you have glue it will come off in next couple week.  Any sutures will be removed at an office visit °7. DRAINS:  If you have drains in place, it is important to keep a list of the amount of drainage produced each day in your drains.  Before leaving the hospital, you should be instructed on drain care.  Call our office if you have any questions about your drains. I will remove your drains when they put out less than 30 cc or ml for 2 consecutive days. °8. ACTIVITIES:  You may resume regular (light) daily activities beginning the next day--such as daily self-care, walking, climbing stairs--gradually increasing activities as tolerated.  You may have sexual intercourse when it is comfortable.  Refrain from any heavy lifting or straining until approved by your doctor. °a. You may drive when you are no longer taking prescription pain medication, you can comfortably wear a seatbelt, and you can safely maneuver your car and apply brakes. °b. RETURN TO WORK:  __________________________________________________________ °9. You should see your doctor in the office for a follow-up appointment approximately 3-5 days after your surgery.  Your doctor’s nurse will typically make your follow-up appointment when she calls you with your pathology report.  Expect your pathology report 3-4business days after surgery. °10. OTHER INSTRUCTIONS: ______________________________________________________________________________________________ ____________________________________________________________________________________________ °WHEN TO CALL YOUR DR Pati Thinnes: °1. Fever over 101.0 °2. Nausea and/or vomiting °3. Extreme swelling or bruising °4. Continued bleeding from incision. °5. Increased pain, redness, or drainage from the incision. °The clinic staff is available   to answer your questions during regular business hours.  Please don’t hesitate to call and ask to speak to one of the nurses for clinical concerns.  If  you have a medical emergency, go to the nearest emergency room or call 911.  A surgeon from [facility] is always on call at the hospital. °[address], [address], [place], [place]  [address] ? [address] [address], [place], [place]   [address] °[phone] ? [phone] ? FAX [phone] °Web site: [email or web address] ° °

## 2018-07-29 NOTE — Progress Notes (Signed)
Nuc med inj performed by nuc med staff. Pt tol well with no additional sedation required. Will call family to bedside and update/provide emotional support.

## 2018-07-29 NOTE — Anesthesia Procedure Notes (Signed)
Anesthesia Regional Block: Pectoralis block   Pre-Anesthetic Checklist: ,, timeout performed, Correct Patient, Correct Site, Correct Laterality, Correct Procedure, Correct Position, site marked, Risks and benefits discussed,  Surgical consent,  Pre-op evaluation,  At surgeon's request and post-op pain management  Laterality: Right and Left  Prep: chloraprep       Needles:   Needle Type: Stimiplex     Needle Length: 9cm      Additional Needles:   Procedures:,,,, ultrasound used (permanent image in chart),,,,  Narrative:  Start time: 07/29/2018 11:55 AM End time: 07/29/2018 12:05 PM Injection made incrementally with aspirations every 5 mL.  Performed by: Personally  Anesthesiologist: Nolon Nations, MD  Additional Notes: Patient tolerated well. Good fascial spread noted.

## 2018-07-29 NOTE — Interval H&P Note (Signed)
History and Physical Interval Note:  07/29/2018 11:57 AM  [name]  has presented today for surgery, with the diagnosis of BRCA POSITIVE, RIGHT BREAST CANCER  The various methods of treatment have been discussed with the patient and family. After consideration of risks, benefits and other options for treatment, the patient has consented to  Procedure(s): BILATERAL NIPPLE SPARING MASTECTOMIES WITH RIGHT  AXILLARY SENTINEL LYMPH NODE BIOPSY (Bilateral) BILATERAL BREAST RECONSTRUCTION WITH PLACEMENT OF TISSUE EXPANDER AND ALLODERM (Bilateral) as a surgical intervention .  The patient's history has been reviewed, patient examined, no change in status, stable for surgery.  I have reviewed the patient's chart and labs.  Questions were answered to the patient's satisfaction.     Rolm Bookbinder

## 2018-07-29 NOTE — Interval H&P Note (Signed)
History and Physical Interval Note:  07/29/2018 10:48 AM  [name]  has presented today for surgery, with the diagnosis of BRCA POSITIVE, RIGHT BREAST CANCER  The various methods of treatment have been discussed with the patient and family. After consideration of risks, benefits and other options for treatment, the patient has consented to  Procedure(s): BILATERAL NIPPLE SPARING MASTECTOMIES WITH RIGHT  AXILLARY SENTINEL LYMPH NODE BIOPSY (Bilateral) BILATERAL BREAST RECONSTRUCTION WITH PLACEMENT OF TISSUE EXPANDER AND ALLODERM (Bilateral) as a surgical intervention .  The patient's history has been reviewed, patient examined, no change in status, stable for surgery.  I have reviewed the patient's chart and labs.  Questions were answered to the patient's satisfaction.     THIMMAPPA, BRINDA   

## 2018-07-29 NOTE — Progress Notes (Signed)
Dr. Donne Hazel notified of patients blood pressure of 71/44 on admission to [facility]. Patient felt a little sick on her stomach. IV LR remaining given to patient. Blood pressure is now 97/62. Patient feels much better and is eating a drinking. Will continue to closely monitor patient. Setzer, Marchelle Folks

## 2018-07-29 NOTE — Telephone Encounter (Signed)
LM for [name] as she said to leave a detailed message in her voice mail if she did not answer. Stated that Morral said no ovarian masses seen. It shows the lining of the uterus thick which could be from her cycle if it was around the time of the [place]. Dr. Denman George is not concerned and says that she will see how the lining looks at her next [place].

## 2018-07-30 ENCOUNTER — Encounter (HOSPITAL_BASED_OUTPATIENT_CLINIC_OR_DEPARTMENT_OTHER): Payer: Self-pay | Admitting: General Surgery

## 2018-07-30 DIAGNOSIS — Z886 Allergy status to analgesic agent status: Secondary | ICD-10-CM | POA: Diagnosis not present

## 2018-07-30 DIAGNOSIS — Z4001 Encounter for prophylactic removal of breast: Secondary | ICD-10-CM | POA: Diagnosis not present

## 2018-07-30 DIAGNOSIS — Z1501 Genetic susceptibility to malignant neoplasm of breast: Secondary | ICD-10-CM | POA: Diagnosis not present

## 2018-07-30 DIAGNOSIS — Z803 Family history of malignant neoplasm of breast: Secondary | ICD-10-CM | POA: Diagnosis not present

## 2018-07-30 DIAGNOSIS — Z7951 Long term (current) use of inhaled steroids: Secondary | ICD-10-CM | POA: Diagnosis not present

## 2018-07-30 DIAGNOSIS — C50211 Malignant neoplasm of upper-inner quadrant of right female breast: Secondary | ICD-10-CM | POA: Diagnosis not present

## 2018-07-30 DIAGNOSIS — Z79899 Other long term (current) drug therapy: Secondary | ICD-10-CM | POA: Diagnosis not present

## 2018-07-30 DIAGNOSIS — C50911 Malignant neoplasm of unspecified site of right female breast: Secondary | ICD-10-CM | POA: Diagnosis not present

## 2018-07-30 DIAGNOSIS — E119 Type 2 diabetes mellitus without complications: Secondary | ICD-10-CM | POA: Diagnosis not present

## 2018-07-30 MED ORDER — KETOROLAC TROMETHAMINE 30 MG/ML IJ SOLN
INTRAMUSCULAR | Status: AC
  Filled 2018-07-30: qty 1

## 2018-07-30 MED ORDER — KETOROLAC TROMETHAMINE 15 MG/ML IJ SOLN
15.0000 mg | Freq: Once | INTRAMUSCULAR | Status: AC
  Administered 2018-07-30: 15 mg via INTRAVENOUS
  Filled 2018-07-30: qty 1

## 2018-07-30 NOTE — Discharge Summary (Signed)
Physician Discharge Summary  Patient ID: [name] MRN: [id number] DOB/AGE: 12/30/1985 32 y.o.  Admit date: 07/29/2018 Discharge date: 07/30/2018  Admission Diagnoses: Right breast cancer, BRCA1  Discharge Diagnoses:  same  Discharged Condition: stable  Hospital Course: Patient demonstrated some hypotension post operatively and received IVF, Hb as expected post op. On POD#1 she was able to tolerated oral pain medication and ambulate without symptoms. She tolerated diet without nausea.  Treatments: surgery: bilateral NSM with TE/ADM reconstruction, right SLN  Discharge Exam: Blood pressure (!) 84/54, pulse 77, temperature 99.2 F (37.3 C), resp. rate 16, height 5' 1"  (1.549 m), weight 61.2 kg (135 lb), last menstrual period 01/29/2018, SpO2 100 %. Incision/Wound: chest soft Tegaderms in place JPs serosanguinous incisions dry  Disposition: Discharge disposition: 01-Home or Self Care       Discharge Instructions    Call MD for:  redness, tenderness, or signs of infection (pain, swelling, bleeding, redness, odor or green/yellow discharge around incision site)   Complete by:  As directed    Call MD for:  temperature >100.5   Complete by:  As directed    Discharge instructions   Complete by:  As directed    Ok to remove dressings and shower am 8.7.19. Soap and water ok, pat Tegaderms dry. Do not remove Tegaderms. No creams or ointments over incisions. Do not let drains dangle in shower, attach to lanyard or similar.Strip and record drains twice daily and bring log to clinic visit.  Breast binder or soft compression bra all other times.  Ok to raise arms above shoulders for bathing and dressing.  No house yard work or exercise until cleared by MD. Recommend ibuprofen as directed with meals to aid with pain control. It is also ok to use Tylenol for pain. Recommend Miralax or Dulcolax as needed for constipation.   Driving Restrictions   Complete by:  As directed    No driving for  2 weeks then no driving if taking narcotics   Lifting restrictions   Complete by:  As directed    No lifting > 5 lbs until cleared by MD   Resume previous diet   Complete by:  As directed       Follow-up Information    Rolm Bookbinder, MD In 2 weeks.   Specialty:  General Surgery Contact information: [address] [place] [address] [phone]        Irene Limbo, MD In 1 week.   Specialty:  Plastic Surgery Why:  as scheduled Contact information: [address] [place] [address] [phone]           Signed: Irene Limbo 07/30/2018, 1:22 PM

## 2018-07-30 NOTE — Telephone Encounter (Signed)
Spoke with Ms Sann and she has not menstruated since 01-2018. Reviewed with [name], NP. Will obtain a follow up US in ~6 weeks as recommend by radiologist in report Pt scheduled for [place] 09-02-18 at [facility] at 1430.  Pt aware of appointment.  Will call Ms Broski with the results of the [place] and plan follow up with Dr. Denman George.

## 2018-07-30 NOTE — Progress Notes (Signed)
  POD#1 bilateral NSM, TE/ADM reconstruction, right SLN  Temp:  [97.4 F (36.3 C)-98.6 F (37 C)] 98.6 F (37 C) (08/06 0159) Pulse Rate:  [77-109] 97 (08/06 0642) Resp:  [13-20] 16 (08/05 1800) BP: (63-112)/(34-73) 88/58 (08/06 0642) SpO2:  [96 %-100 %] 98 % (08/06 0642) Weight:  [61.2 kg (135 lb)] 61.2 kg (135 lb) (08/05 1039)   PO 640 JP L 43/47 R 55/58  PE Chest soft tegaderms in place Drains watery serosanguinous  A/P Some hypotension, alert tolerating PO IVF and observation F/u arranged, reviewed post op instructions  Irene Limbo, MD [facility] [phone], pin [phone]

## 2018-07-30 NOTE — Anesthesia Postprocedure Evaluation (Signed)
Anesthesia Post Note  Patient: [name]  Procedure(s) Performed: BILATERAL NIPPLE SPARING MASTECTOMIES WITH RIGHT  AXILLARY SENTINEL LYMPH NODE BIOPSY (Bilateral Breast) BILATERAL BREAST RECONSTRUCTION WITH PLACEMENT OF TISSUE EXPANDER AND ALLODERM (Bilateral Breast)     Patient location during evaluation: PACU Anesthesia Type: General Level of consciousness: sedated and patient cooperative Pain management: pain level controlled Vital Signs Assessment: post-procedure vital signs reviewed and stable Respiratory status: spontaneous breathing Cardiovascular status: stable Anesthetic complications: no    Last Vitals:  Vitals:   07/30/18 0642 07/30/18 0751  BP: (!) 88/58 (!) 94/57  Pulse: 97 96  Resp:    Temp:  37.8 C  SpO2: 98% 99%    Last Pain:  Vitals:   07/30/18 0751  TempSrc:   PainSc: 2                  Nolon Nations

## 2018-07-30 NOTE — Progress Notes (Signed)
Patient ID: [name], female   DOB: 1986/01/02, 32 y.o.   MRN: [id number] Decreased bp overnight, responsive to fluids, happened with port also She is awake, alert this am.  bp low but I think close to her normal.  No hematoma on either surgical site, drains as expected. Hb recheck overnight was acceptable.  Will have her get up this am and see how she does to determine if needs to stay another night.

## 2018-07-30 NOTE — Progress Notes (Signed)
Ambulated patient in hallway. BP after ambulation was 84/57. Patient not complaining of feeling dizzy or faint. Patient had good color and only complaining of pain. MD told to discharge if patient tolerated ambulation. Will discharge at this time. Setzer, Marchelle Folks

## 2018-08-05 ENCOUNTER — Inpatient Hospital Stay: Payer: BLUE CROSS/BLUE SHIELD | Admitting: Hematology and Oncology

## 2018-08-07 ENCOUNTER — Telehealth: Payer: Self-pay | Admitting: *Deleted

## 2018-08-07 NOTE — Telephone Encounter (Signed)
Patient called in to say that she would not have insurance after August 31st and would like to move her Ultrasound appointment up.  I called scheduling and got her ultrasound appointment moved up to August 22nd at 1:30pm.  Patient was very appreciative.  Patient verbalized understanding.

## 2018-08-12 ENCOUNTER — Inpatient Hospital Stay: Payer: BLUE CROSS/BLUE SHIELD | Attending: Genetic Counselor | Admitting: Hematology and Oncology

## 2018-08-12 ENCOUNTER — Encounter: Payer: Self-pay | Admitting: *Deleted

## 2018-08-12 DIAGNOSIS — C50211 Malignant neoplasm of upper-inner quadrant of right female breast: Secondary | ICD-10-CM | POA: Insufficient documentation

## 2018-08-12 DIAGNOSIS — Z87891 Personal history of nicotine dependence: Secondary | ICD-10-CM | POA: Insufficient documentation

## 2018-08-12 DIAGNOSIS — H60502 Unspecified acute noninfective otitis externa, left ear: Secondary | ICD-10-CM | POA: Insufficient documentation

## 2018-08-12 DIAGNOSIS — H9202 Otalgia, left ear: Secondary | ICD-10-CM | POA: Insufficient documentation

## 2018-08-12 NOTE — Assessment & Plan Note (Deleted)
02/01/2018:Screening detected right breast mass in the upper inner quadrant measuring 1.5 cm, with additional enhancement it measured 2.2 cm. No axillary lymph nodes. 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 Genetic counseling and testing 2. neoadjuvant chemotherapy with dose dense Adriamycin and Cytoxan every 2 weeks x4 followed by Taxol weekly x8 (changed to Abraxane due to rash) accompanied by carboplatin every 3 weeks x 3 completed 07/05/2018 3.Genetics negative    CT CAP and bone scan: Negative for metastases ---------------------------------------------------------------------------------------------------------------------------------  07/29/2018:Bilateral mastectomies: Right mastectomy benign, 0/6 lymph nodes; left mastectomy: Benign, pathologic complete response  Pathology review: Discussed final pathology report.  I described the importance of pathologic complete response and prognosis.  Since she had mastectomies, there is no role of adjuvant radiation. There is also no role of adjuvant additional systemic chemotherapy.  I will request port to be removed.  Return to clinic in 3 months for survivorship care plan visit

## 2018-08-13 ENCOUNTER — Telehealth: Payer: Self-pay | Admitting: Hematology and Oncology

## 2018-08-13 NOTE — Telephone Encounter (Signed)
Left message for patient regarding upcoming nov appts per 8/19 sch message.

## 2018-08-15 ENCOUNTER — Telehealth: Payer: Self-pay

## 2018-08-15 ENCOUNTER — Ambulatory Visit (HOSPITAL_COMMUNITY)
Admission: RE | Admit: 2018-08-15 | Discharge: 2018-08-15 | Disposition: A | Discharge status: Home / Self Care | Payer: BLUE CROSS/BLUE SHIELD | Source: Ambulatory Visit | Attending: Gynecologic Oncology | Admitting: Gynecologic Oncology

## 2018-08-15 DIAGNOSIS — Z1509 Genetic susceptibility to other malignant neoplasm: Principal | ICD-10-CM

## 2018-08-15 DIAGNOSIS — Z1501 Genetic susceptibility to malignant neoplasm of breast: Secondary | ICD-10-CM

## 2018-08-19 ENCOUNTER — Inpatient Hospital Stay (HOSPITAL_BASED_OUTPATIENT_CLINIC_OR_DEPARTMENT_OTHER): Payer: BLUE CROSS/BLUE SHIELD | Admitting: Medical

## 2018-08-19 ENCOUNTER — Telehealth: Payer: Self-pay | Admitting: *Deleted

## 2018-08-19 VITALS — BP 114/71 | HR 75 | Temp 98.5°F | Resp 18 | Ht 61.0 in | Wt 136.4 lb

## 2018-08-19 DIAGNOSIS — H60502 Unspecified acute noninfective otitis externa, left ear: Secondary | ICD-10-CM | POA: Diagnosis not present

## 2018-08-19 DIAGNOSIS — H9202 Otalgia, left ear: Secondary | ICD-10-CM | POA: Diagnosis not present

## 2018-08-19 DIAGNOSIS — C50011 Malignant neoplasm of nipple and areola, right female breast: Secondary | ICD-10-CM

## 2018-08-19 DIAGNOSIS — C50211 Malignant neoplasm of upper-inner quadrant of right female breast: Secondary | ICD-10-CM

## 2018-08-19 DIAGNOSIS — Z87891 Personal history of nicotine dependence: Secondary | ICD-10-CM | POA: Diagnosis not present

## 2018-08-19 MED ORDER — OFLOXACIN 0.3 % OT SOLN: 5.0000 [drp] | Freq: Two times a day (BID) | OTIC | 0 refills | Status: DC

## 2018-08-19 NOTE — Progress Notes (Signed)
[facility]pt[facility] Management Clinic Progress Note   [name] [id number] 1986-03-21 32 y.o.  Chyrl Civatte is managed by Dr. Nicholas Lose  Actively treated with chemotherapy/immunotherapy: no  Assessment: Plan:    Acute otitis externa of left ear, unspecified type - Plan: ofloxacin (FLOXIN OTIC) 0.3 % OTIC solution  Ear pain, left - Plan: Ambulatory referral to ENT  Malignant neoplasm of nipple of right breast in female, unspecified estrogen receptor status ([place])   Otitis externa of the left ear: The patient was given a prescription for ofloxacin otic suspension.  Left ear pain: The patient has been referred to ENT.  Malignant neoplasm of the right breast: Patient is status post cycle 8-day 15 of Abraxane which was dosed on 07/05/2018.  She is scheduled to be seen in follow-up on 11/01/2018.  Please see After Visit Summary for patient specific instructions.  Future Appointments  Date Time Provider [place]  11/01/2018 10:00 AM Causey, Charlestine Massed, NP [facility] None    Orders Placed This Encounter  Procedures  . Ambulatory referral to ENT       Subjective:   Patient ID:  [name] is a 32 y.o. (DOB 03-17-1986) female.  Chief Complaint:  Chief Complaint  Patient presents with  . Ear Pain    left ear only  . Nasal Congestion    HPI [name] is a 32 year old female with a history of a malignant neoplasm of the right breast who is managed by Dr. Payton Mccallum and is status post cycle 8, day 15 of Abraxane which was last dosed on 07/05/2018.  She is currently receiving Cipro for a postsurgical infection of the right chest.  She has a history of left ear pain and otitis media.  She had been on Claritin, Zyrtec, and Flonase daily while she was on chemotherapy but stopped this around the time of her surgery.  She discontinued this for 2 weeks.  Since that time she has developed left ear pain and congestion.  She restarted Claritin, Zyrtec, and Flonase 2 days  ago.  She has been taking oxycodone, Tylenol, and Motrin without relief of her symptoms.  She reports that her left ear pain is throbbing.  It is previously been discussed with the patient that she may need to be referred to ENT.  She request a referral today.  She would like to be seen by someone close to her home in [facility].  She denies fevers, chills, or sweats.  She has no other upper respiratory symptoms.  She has no productive cough.  Medications: I have reviewed the patient's current medications.  Allergies:  Allergies  Allergen Reactions  . Lidocaine Other (See Comments)    NUMBNESS AND TINGLING ARMS AND LEGS    Past Medical History:  Diagnosis Date  . Acute ear infection 07/22/2018   current antibiotic  . Breast cancer of upper-inner quadrant of right female breast ([place]) 01/2018  . Family history of breast cancer   . Family history of colon cancer   . Family history of stomach cancer   . History of chemotherapy    finished 07/05/2018  . History of palpitations    due to low K+  . Thyroid nodule     Past Surgical History:  Procedure Laterality Date  . BREAST RECONSTRUCTION WITH PLACEMENT OF TISSUE EXPANDER AND ALLODERM Bilateral 07/29/2018   Procedure: BILATERAL BREAST RECONSTRUCTION WITH PLACEMENT OF TISSUE EXPANDER AND ALLODERM;  Surgeon: Irene Limbo, MD;  Location: [facility];  Service: Clinical cytogeneticist;  Laterality: Bilateral;  . CESAREAN SECTION    . CESAREAN SECTION WITH BILATERAL TUBAL LIGATION Bilateral 11/19/2013   Procedure: REPEAT CESAREAN SECTION WITH BILATERAL TUBAL LIGATION; TWINS;  Surgeon: Lovenia Kim, MD;  Location: [facility];  Service: Obstetrics;  Laterality: Bilateral;  EDD: 12/09/13  . CHOLECYSTECTOMY    . PORTACATH PLACEMENT N/A 02/20/2018   Procedure: INSERTION PORT-A-CATH WITH ULTRASOUND;  Surgeon: Rolm Bookbinder, MD;  Location: [facility];  Service: General;  Laterality: N/A;    Family History  Problem  Relation Age of Onset  . Cirrhosis Father   . Breast cancer Paternal Aunt 41       BRCA1 [id number] pos  . Heart disease Maternal Grandfather        Heart attack  . Breast cancer Paternal Aunt        dx in her 28s  . Colon cancer Maternal Aunt        dx in her 60s  . Lupus Sister        pat 1/2 sister  . Other Brother 19       pat 1/2 brother died in [place]  . Lung cancer Maternal Uncle        smoker  . Other Paternal Aunt        died in a MVA  . Other Paternal Aunt        died from [place] History   Socioeconomic History  . Marital status: Married    Spouse name: Not on file  . Number of children: Not on file  . Years of education: Not on file  . Highest education level: Not on file  Occupational History  . Not on file  Social Needs  . Financial resource strain: Not on file  . Food insecurity:    Worry: Not on file    Inability: Not on file  . Transportation needs:    Medical: Not on file    Non-medical: Not on file  Tobacco Use  . Smoking status: Former Smoker    Last attempt to quit: 12/25/2003    Years since quitting: 14.6  . Smokeless tobacco: Never Used  Substance and Sexual Activity  . Alcohol use: No  . Drug use: No  . Sexual activity: Not Currently  Lifestyle  . Physical activity:    Days per week: Not on file    Minutes per session: Not on file  . Stress: Not on file  Relationships  . Social connections:    Talks on phone: Not on file    Gets together: Not on file    Attends religious service: Not on file    Active member of club or organization: Not on file    Attends meetings of clubs or organizations: Not on file    Relationship status: Not on file  . Intimate partner violence:    Fear of current or ex partner: Not on file    Emotionally abused: Not on file    Physically abused: Not on file    Forced sexual activity: Not on file  Other Topics Concern  . Not on file  Social History Narrative  . Not on file    Past Medical  History, Surgical history, Social history, and Family history were reviewed and updated as appropriate.   Please see review of systems for further details on the patient's review from today.   Review of Systems:  Review of Systems  Constitutional: Negative  for chills, diaphoresis, fatigue and fever.  HENT: Positive for congestion and ear pain. Negative for postnasal drip, rhinorrhea and sore throat.   Respiratory: Negative for cough, shortness of breath and wheezing.   Cardiovascular: Negative for palpitations.  Neurological: Negative for headaches.    Objective:   Physical Exam:  BP 114/71 (BP Location: Left Arm, Patient Position: Sitting)   Pulse 75   Temp 98.5 F (36.9 C) (Oral)   Resp 18   Ht 5' 1"  (1.549 m)   Wt 136 lb 6.4 oz (61.9 kg)   SpO2 100%   BMI 25.77 kg/m  ECOG: 0  Physical Exam  Constitutional: No distress.  HENT:  Head: Normocephalic and atraumatic.  Right Ear: External ear normal.  Left Ear: External ear normal.  Mouth/Throat: Oropharynx is clear and moist. No oropharyngeal exudate.  Left ear pain with manipulation of the tragus.  Neck: Normal range of motion. Neck supple.  Cardiovascular: Normal rate, regular rhythm and normal heart sounds. Exam reveals no gallop and no friction rub.  No murmur heard. Pulmonary/Chest: Effort normal and breath sounds normal. No respiratory distress. She has no wheezes. She has no rales.  Lymphadenopathy:    She has no cervical adenopathy.  Neurological: She is alert. Coordination normal.  Skin: Skin is warm and dry. No rash noted. She is not diaphoretic. No erythema.  Psychiatric: She has a normal mood and affect. Her behavior is normal. Judgment and thought content normal.    Lab Review:     Component Value Date/Time   NA 141 07/05/2018 0918   K 3.6 07/05/2018 0918   CL 104 07/05/2018 0918   CO2 29 07/05/2018 0918   GLUCOSE 101 (H) 07/05/2018 0918   BUN 8 07/05/2018 0918   CREATININE 0.69 07/05/2018 0918    CALCIUM 9.9 07/05/2018 0918   PROT 7.3 07/05/2018 0918   ALBUMIN 4.6 07/05/2018 0918   AST 22 07/05/2018 0918   ALT 33 07/05/2018 0918   ALKPHOS 67 07/05/2018 0918   BILITOT 0.4 07/05/2018 0918   GFRNONAA >60 07/05/2018 0918   GFRAA >60 07/05/2018 0918       Component Value Date/Time   WBC 4.1 07/05/2018 0918   WBC 3.1 (L) 04/28/2018 1315   RBC 3.27 (L) 07/05/2018 0918   HGB 8.9 (L) 07/29/2018 2151   HGB 10.7 (L) 07/05/2018 0918   HCT 30.9 (L) 07/05/2018 0918   PLT 175 07/05/2018 0918   MCV 94.5 07/05/2018 0918   MCH 32.7 07/05/2018 0918   MCHC 34.6 07/05/2018 0918   RDW 13.8 07/05/2018 0918   LYMPHSABS 1.1 07/05/2018 0918   MONOABS 0.2 07/05/2018 0918   EOSABS 0.0 07/05/2018 0918   BASOSABS 0.0 07/05/2018 0918   -------------------------------  Imaging from last 24 hours (if applicable):  Radiology interpretation: US Pelvis Transvanginal Non-ob (tv Only)  Result Date: 07/25/2018 CLINICAL DATA:  BRCA 1 positive gene carrier, current treatment for breast cancer EXAM: TRANSABDOMINAL AND TRANSVAGINAL ULTRASOUND OF PELVIS TECHNIQUE: Both transabdominal and transvaginal ultrasound examinations of the pelvis were performed. Transabdominal technique was performed for global imaging of the pelvis including uterus, ovaries, adnexal regions, and pelvic cul-de-sac. It was necessary to proceed with endovaginal exam following the transabdominal exam to visualize the ovaries. COMPARISON:  CT 02/19/2018, ultrasound 06/03/2015 FINDINGS: Uterus Measurements: 9.4 x 3.7 x 5.8 cm. No fibroids or other mass visualized. Endometrium Thickness: 18.4 mm.  No focal abnormality visualized. Right ovary Measurements: 2.3 x 1.9 x 2.1 cm. Punctate 1 mm calcification. No mass. Left  ovary Measurements: 2.8 x 1.9 x 2.2 cm.  Follicle measuring 1.4 cm. Other findings No abnormal free fluid. IMPRESSION: 1. Negative for ovarian mass 2. Thickened endometrial stripe at 18.4 mm. Endometrial thickness is considered  abnormal. Consider follow-up by [place] in 6-8 weeks. Electronically Signed   By: Donavan Foil M.D.   On: 07/25/2018 15:59   US Pelvis (transabdominal Only)  Result Date: 07/25/2018 CLINICAL DATA:  BRCA 1 positive gene carrier, current treatment for breast cancer EXAM: TRANSABDOMINAL AND TRANSVAGINAL ULTRASOUND OF PELVIS TECHNIQUE: Both transabdominal and transvaginal ultrasound examinations of the pelvis were performed. Transabdominal technique was performed for global imaging of the pelvis including uterus, ovaries, adnexal regions, and pelvic cul-de-sac. It was necessary to proceed with endovaginal exam following the transabdominal exam to visualize the ovaries. COMPARISON:  CT 02/19/2018, ultrasound 06/03/2015 FINDINGS: Uterus Measurements: 9.4 x 3.7 x 5.8 cm. No fibroids or other mass visualized. Endometrium Thickness: 18.4 mm.  No focal abnormality visualized. Right ovary Measurements: 2.3 x 1.9 x 2.1 cm. Punctate 1 mm calcification. No mass. Left ovary Measurements: 2.8 x 1.9 x 2.2 cm.  Follicle measuring 1.4 cm. Other findings No abnormal free fluid. IMPRESSION: 1. Negative for ovarian mass 2. Thickened endometrial stripe at 18.4 mm. Endometrial thickness is considered abnormal. Consider follow-up by [place] in 6-8 weeks. Electronically Signed   By: Donavan Foil M.D.   On: 07/25/2018 15:59   Nm Sentinel Node Inj-no Rpt (breast)  Result Date: 07/29/2018 Sulfur colloid was injected by the nuclear medicine technologist for melanoma sentinel node.   US Pelvic Complete With Transvaginal  Result Date: 08/15/2018 CLINICAL DATA:  Follow-up examination for thickened endometrial stripe. BRCA positive. EXAM: TRANSABDOMINAL AND TRANSVAGINAL ULTRASOUND OF PELVIS TECHNIQUE: Both transabdominal and transvaginal ultrasound examinations of the pelvis were performed. Transabdominal technique was performed for global imaging of the pelvis including uterus, ovaries, adnexal regions, and pelvic cul-de-sac. It was necessary to  proceed with endovaginal exam following the transabdominal exam to visualize the uterus, endometrium, and ovaries. COMPARISON:  Prior ultrasound from 07/25/2018. FINDINGS: Uterus Measurements: 9.5 x 4.6 x 5.4 cm. No fibroids or other mass visualized. Endometrium Thickness: 14.0 mm. No focal abnormality visualized. Small volume free anechoic fluid present within the endometrial canal. Right ovary Measurements: 2.9 x 1.7 x 2.4 cm. Normal appearance/no adnexal mass. 1.3 cm dominant follicle/follicular cyst noted. Left ovary Measurements: 2.7 x 1.5 x 1.5 cm. Normal appearance/no adnexal mass. 1.5 cm dominant follicle/follicular cyst noted. Other findings No abnormal free fluid. IMPRESSION: 1. Endometrial stripe measures 14 mm on today's study, within normal limits for thickness. 2. Otherwise negative pelvic ultrasound.  No pelvic or adnexal mass. Electronically Signed   By: Jeannine Boga M.D.   On: 08/15/2018 14:51

## 2018-08-19 NOTE — Telephone Encounter (Signed)
Told [name] that the US showed that the uterine lining was in in normal limits for thickness at 14 mm Per Melissa Cross,NP. Pt will need follow up US in 6 months. Will let patient know if an appointment with Dr. Denman George will be needed after [place]. Pt would like to know Dr. Serita Grit opinion  as to what may have caused the uterine thickening on previous US since she has not menstruated since 01-2018.

## 2018-08-19 NOTE — Telephone Encounter (Signed)
Received call from Dr. Donne Hazel regarding pt with left ear pain/congestion that started after chemotherapy began and pt still c/o symptoms, request for her to be seen in [facility]. Msg sent top schedule for 1145 today. VM left with [facility] nurse.

## 2018-08-28 NOTE — Telephone Encounter (Signed)
LM for [name] stating that Dr. Denman George said that the blood flow to the uterus has an ebb and flow.  There was probably and increased flow to her uterus  with [place] on 07-25-18 vs 08-15-18. Requested pt call the office to let us know if she wants to schedule the [place] now or call back in in a couple of months to schedule appointment.

## 2018-09-02 ENCOUNTER — Ambulatory Visit (HOSPITAL_COMMUNITY): Payer: BLUE CROSS/BLUE SHIELD

## 2018-10-25 ENCOUNTER — Other Ambulatory Visit: Payer: Self-pay | Admitting: Plastic Surgery

## 2018-10-25 DIAGNOSIS — N6310 Unspecified lump in the right breast, unspecified quadrant: Secondary | ICD-10-CM

## 2018-10-29 ENCOUNTER — Ambulatory Visit
Admission: RE | Admit: 2018-10-29 | Discharge: 2018-10-29 | Disposition: A | Discharge status: Home / Self Care | Payer: No Typology Code available for payment source | Source: Ambulatory Visit | Attending: Plastic Surgery | Admitting: Plastic Surgery

## 2018-10-29 ENCOUNTER — Ambulatory Visit
Admission: RE | Admit: 2018-10-29 | Discharge: 2018-10-29 | Disposition: A | Discharge status: Home / Self Care | Payer: BLUE CROSS/BLUE SHIELD | Source: Ambulatory Visit | Attending: Plastic Surgery | Admitting: Plastic Surgery

## 2018-10-29 DIAGNOSIS — N6310 Unspecified lump in the right breast, unspecified quadrant: Secondary | ICD-10-CM

## 2018-11-01 ENCOUNTER — Encounter: Payer: BLUE CROSS/BLUE SHIELD | Admitting: Adult Health

## 2018-11-11 ENCOUNTER — Other Ambulatory Visit: Payer: Self-pay

## 2018-11-11 ENCOUNTER — Encounter (HOSPITAL_BASED_OUTPATIENT_CLINIC_OR_DEPARTMENT_OTHER): Payer: Self-pay | Admitting: *Deleted

## 2018-11-11 DIAGNOSIS — N61 Mastitis without abscess: Secondary | ICD-10-CM

## 2018-11-11 HISTORY — DX: Mastitis without abscess: N61.0

## 2018-11-11 NOTE — Pre-Procedure Instructions (Signed)
To come pick up Ensure pre-surgery drink 10 oz. - to drink by 0815 DOS.

## 2018-11-11 NOTE — H&P (Signed)
Subjective:     Patient ID: [name] is a 32 y.o. female.  HPI  5.5 weeks post op. Completed Cipro course for concern cellulitis left chest after fever. Since last visit has had tooth extracted.  Presented with palpable right breast mass. MMG/US demonstrated 1.5 cm UIQ right breast mass, with additional enhancement it measured 2.2 cm. No axillary lymph nodes. Biopsy revealed IDC with DCIS, triple negative. MRI demonstrated known malignancy in the upper inner right breast measures 1.8 x 2.2 X 2.2 cm.  Staging scans negative. Completed neoadjuvant chemotherapy 7.15.19. Final MRI with mass measuring 6 x 7 x 7 mm.  Final pathology right breast with no residual carcinoma, 0/6 SLN.  Genetics with BRCA1 mutation.   Prior 36 B, goal C cup. Right mastectomy 382 g Left 360 g She works as an Chief Technology Officer. 3 daughters including twins.  Review of Systems     Objective:   Physical Exam CV: normal heart sounds PULM: clear to auscultation Chest:  Scars maturing soft, SN to nipple R 21 cm 23 cm, Nipple to IMF R 7.5 L 8 cm Right chest port  Assessment:     Right breast cancer UIQ ER- (triple negative) Neoadjuvant chemotherapy BRCA1 S/p bilateral NSM, prepectoral TE/ADM (Alloderm) reconstruction    Plan:    Plan removal bilateral TE and placement implants. Given prepectoral placement, recommend HCG or capacity filled implants. Reviewed saline vs silicone, MRI or [place] surveillance silicone implants, shared risks contracture, rupture. Reviewed implants are not permanent devices and will require additional surgery. Plan smooth round capacity filled.  Discussed role of fat grafting to improve contour, reduce visible rippling. Discussed variable take graft, need to repeat procedure, fat necrosis that presents as lumps, cysts. She declines this at this time.  Oncology has given ok to remove port.  RX Oxycodone 5/325 given.  Natrelle 133SFV-12-T 400 ml tissue  expanders placed bilateral,  390 ml saline bilateral  Irene Limbo, MD [facility] Plastic & Reconstructive Surgery [phone], pin [phone]

## 2018-11-15 ENCOUNTER — Telehealth: Payer: Self-pay | Admitting: Hematology and Oncology

## 2018-11-15 NOTE — Telephone Encounter (Signed)
Scheduled appt per 11/22 sch msg - pt is aware of appt date and time  ° °

## 2018-11-19 ENCOUNTER — Ambulatory Visit (HOSPITAL_BASED_OUTPATIENT_CLINIC_OR_DEPARTMENT_OTHER): Payer: BLUE CROSS/BLUE SHIELD | Admitting: Anesthesiology

## 2018-11-19 ENCOUNTER — Ambulatory Visit (HOSPITAL_BASED_OUTPATIENT_CLINIC_OR_DEPARTMENT_OTHER)
Admission: RE | Admit: 2018-11-19 | Discharge: 2018-11-19 | Disposition: A | Discharge status: Home / Self Care | Payer: BLUE CROSS/BLUE SHIELD | Source: Ambulatory Visit | Attending: Plastic Surgery | Admitting: Plastic Surgery

## 2018-11-19 ENCOUNTER — Encounter (HOSPITAL_BASED_OUTPATIENT_CLINIC_OR_DEPARTMENT_OTHER)
Admission: RE | Disposition: A | Discharge status: Home / Self Care | Payer: Self-pay | Source: Ambulatory Visit | Attending: Plastic Surgery

## 2018-11-19 ENCOUNTER — Encounter (HOSPITAL_BASED_OUTPATIENT_CLINIC_OR_DEPARTMENT_OTHER): Payer: Self-pay | Admitting: Anesthesiology

## 2018-11-19 ENCOUNTER — Other Ambulatory Visit: Payer: Self-pay

## 2018-11-19 DIAGNOSIS — Z421 Encounter for breast reconstruction following mastectomy: Secondary | ICD-10-CM | POA: Diagnosis not present

## 2018-11-19 DIAGNOSIS — Z452 Encounter for adjustment and management of vascular access device: Secondary | ICD-10-CM | POA: Diagnosis not present

## 2018-11-19 DIAGNOSIS — E876 Hypokalemia: Secondary | ICD-10-CM | POA: Diagnosis not present

## 2018-11-19 DIAGNOSIS — Z87891 Personal history of nicotine dependence: Secondary | ICD-10-CM | POA: Insufficient documentation

## 2018-11-19 DIAGNOSIS — Z9221 Personal history of antineoplastic chemotherapy: Secondary | ICD-10-CM | POA: Diagnosis not present

## 2018-11-19 DIAGNOSIS — Z853 Personal history of malignant neoplasm of breast: Secondary | ICD-10-CM | POA: Insufficient documentation

## 2018-11-19 DIAGNOSIS — Z9013 Acquired absence of bilateral breasts and nipples: Secondary | ICD-10-CM | POA: Diagnosis not present

## 2018-11-19 DIAGNOSIS — Z9011 Acquired absence of right breast and nipple: Secondary | ICD-10-CM | POA: Insufficient documentation

## 2018-11-19 HISTORY — DX: Adverse effect of unspecified anesthetic, initial encounter: T41.45XA

## 2018-11-19 HISTORY — DX: Mastitis without abscess: N61.0

## 2018-11-19 HISTORY — DX: Irritable bowel syndrome, unspecified: K58.9

## 2018-11-19 HISTORY — DX: Other complications of anesthesia, initial encounter: T88.59XA

## 2018-11-19 HISTORY — PX: REMOVAL OF BILATERAL TISSUE EXPANDERS WITH PLACEMENT OF BILATERAL BREAST IMPLANTS: SHX6431

## 2018-11-19 HISTORY — PX: PORTA CATH REMOVAL: CATH118286

## 2018-11-19 HISTORY — DX: Cluster headache syndrome, unspecified, not intractable: G44.009

## 2018-11-19 HISTORY — DX: Personal history of malignant neoplasm of breast: Z85.3

## 2018-11-19 LAB — POCT PREGNANCY, URINE: Preg Test, Ur: NEGATIVE

## 2018-11-19 SURGERY — REMOVAL, TISSUE EXPANDER, BREAST, BILATERAL, WITH BILATERAL IMPLANT IMPLANT INSERTION
Anesthesia: General | Site: Chest | Laterality: Right

## 2018-11-19 MED ORDER — ONDANSETRON HCL 4 MG/2ML IJ SOLN
4.0000 mg | Freq: Once | INTRAMUSCULAR | Status: AC | PRN
  Administered 2018-11-19: 4 mg via INTRAVENOUS

## 2018-11-19 MED ORDER — ONDANSETRON HCL 4 MG/2ML IJ SOLN
INTRAMUSCULAR | Status: AC
  Filled 2018-11-19: qty 2

## 2018-11-19 MED ORDER — POVIDONE-IODINE 10 % EX SOLN
CUTANEOUS | Status: DC | PRN
  Administered 2018-11-19: 1 via TOPICAL

## 2018-11-19 MED ORDER — CHLORHEXIDINE GLUCONATE CLOTH 2 % EX PADS: 6.0000 | MEDICATED_PAD | Freq: Once | CUTANEOUS | Status: DC

## 2018-11-19 MED ORDER — ACETAMINOPHEN 325 MG PO TABS: 325.0000 mg | ORAL_TABLET | ORAL | Status: DC | PRN

## 2018-11-19 MED ORDER — MIDAZOLAM HCL 2 MG/2ML IJ SOLN
INTRAMUSCULAR | Status: AC
  Filled 2018-11-19: qty 2

## 2018-11-19 MED ORDER — LIDOCAINE 2% (20 MG/ML) 5 ML SYRINGE
INTRAMUSCULAR | Status: AC
  Filled 2018-11-19: qty 5

## 2018-11-19 MED ORDER — OXYCODONE HCL 5 MG PO TABS
ORAL_TABLET | ORAL | Status: AC
  Filled 2018-11-19: qty 1

## 2018-11-19 MED ORDER — CEFAZOLIN SODIUM-DEXTROSE 2-4 GM/100ML-% IV SOLN
2.0000 g | INTRAVENOUS | Status: AC
  Administered 2018-11-19: 2 g via INTRAVENOUS

## 2018-11-19 MED ORDER — PHENYLEPHRINE 40 MCG/ML (10ML) SYRINGE FOR IV PUSH (FOR BLOOD PRESSURE SUPPORT)
PREFILLED_SYRINGE | INTRAVENOUS | Status: AC
  Filled 2018-11-19: qty 10

## 2018-11-19 MED ORDER — SCOPOLAMINE 1 MG/3DAYS TD PT72
MEDICATED_PATCH | TRANSDERMAL | Status: AC
  Filled 2018-11-19: qty 1

## 2018-11-19 MED ORDER — ROCURONIUM BROMIDE 50 MG/5ML IV SOSY
PREFILLED_SYRINGE | INTRAVENOUS | Status: AC
  Filled 2018-11-19: qty 5

## 2018-11-19 MED ORDER — SUCCINYLCHOLINE CHLORIDE 200 MG/10ML IV SOSY
PREFILLED_SYRINGE | INTRAVENOUS | Status: AC
  Filled 2018-11-19: qty 10

## 2018-11-19 MED ORDER — ACETAMINOPHEN 500 MG PO TABS
ORAL_TABLET | ORAL | Status: AC
  Filled 2018-11-19: qty 2

## 2018-11-19 MED ORDER — CEFAZOLIN SODIUM-DEXTROSE 2-4 GM/100ML-% IV SOLN
INTRAVENOUS | Status: AC
  Filled 2018-11-19: qty 100

## 2018-11-19 MED ORDER — DEXAMETHASONE SODIUM PHOSPHATE 4 MG/ML IJ SOLN
INTRAMUSCULAR | Status: DC | PRN
  Administered 2018-11-19: 10 mg via INTRAVENOUS

## 2018-11-19 MED ORDER — OXYCODONE HCL 5 MG/5ML PO SOLN: 5.0000 mg | Freq: Once | ORAL | Status: AC | PRN

## 2018-11-19 MED ORDER — SUFENTANIL CITRATE 50 MCG/ML IV SOLN
INTRAVENOUS | Status: DC | PRN
  Administered 2018-11-19 (×2): 5 ug via INTRAVENOUS
  Administered 2018-11-19: 25 ug via INTRAVENOUS

## 2018-11-19 MED ORDER — MEPERIDINE HCL 25 MG/ML IJ SOLN: 6.2500 mg | INTRAMUSCULAR | Status: DC | PRN

## 2018-11-19 MED ORDER — SODIUM CHLORIDE 0.9 % IV SOLN
INTRAVENOUS | Status: DC | PRN
  Administered 2018-11-19: 1000 mL

## 2018-11-19 MED ORDER — ROCURONIUM BROMIDE 100 MG/10ML IV SOLN
INTRAVENOUS | Status: DC | PRN
  Administered 2018-11-19: 40 mg via INTRAVENOUS

## 2018-11-19 MED ORDER — SCOPOLAMINE 1 MG/3DAYS TD PT72: 1.0000 | MEDICATED_PATCH | Freq: Once | TRANSDERMAL | Status: DC | PRN

## 2018-11-19 MED ORDER — ACETAMINOPHEN 160 MG/5ML PO SOLN: 325.0000 mg | ORAL | Status: DC | PRN

## 2018-11-19 MED ORDER — DEXAMETHASONE SODIUM PHOSPHATE 10 MG/ML IJ SOLN
INTRAMUSCULAR | Status: AC
  Filled 2018-11-19: qty 1

## 2018-11-19 MED ORDER — KETOROLAC TROMETHAMINE 30 MG/ML IJ SOLN: 30.0000 mg | Freq: Once | INTRAMUSCULAR | Status: DC | PRN

## 2018-11-19 MED ORDER — CELECOXIB 200 MG PO CAPS
ORAL_CAPSULE | ORAL | Status: AC
  Filled 2018-11-19: qty 1

## 2018-11-19 MED ORDER — ACETAMINOPHEN 500 MG PO TABS
1000.0000 mg | ORAL_TABLET | ORAL | Status: AC
  Administered 2018-11-19: 1000 mg via ORAL

## 2018-11-19 MED ORDER — ONDANSETRON HCL 4 MG/2ML IJ SOLN
INTRAMUSCULAR | Status: DC | PRN
  Administered 2018-11-19: 4 mg via INTRAVENOUS

## 2018-11-19 MED ORDER — SUGAMMADEX SODIUM 500 MG/5ML IV SOLN
INTRAVENOUS | Status: AC
  Filled 2018-11-19: qty 5

## 2018-11-19 MED ORDER — FENTANYL CITRATE (PF) 100 MCG/2ML IJ SOLN: 50.0000 ug | INTRAMUSCULAR | Status: DC | PRN

## 2018-11-19 MED ORDER — SUFENTANIL CITRATE 50 MCG/ML IV SOLN
INTRAVENOUS | Status: AC
  Filled 2018-11-19: qty 1

## 2018-11-19 MED ORDER — GABAPENTIN 300 MG PO CAPS
ORAL_CAPSULE | ORAL | Status: AC
  Filled 2018-11-19: qty 1

## 2018-11-19 MED ORDER — EPHEDRINE 5 MG/ML INJ
INTRAVENOUS | Status: AC
  Filled 2018-11-19: qty 10

## 2018-11-19 MED ORDER — OXYCODONE HCL 5 MG PO TABS
5.0000 mg | ORAL_TABLET | Freq: Once | ORAL | Status: AC | PRN
  Administered 2018-11-19: 5 mg via ORAL

## 2018-11-19 MED ORDER — SCOPOLAMINE 1 MG/3DAYS TD PT72
MEDICATED_PATCH | TRANSDERMAL | Status: DC | PRN
  Administered 2018-11-19: 1 via TRANSDERMAL

## 2018-11-19 MED ORDER — PROPOFOL 10 MG/ML IV BOLUS
INTRAVENOUS | Status: AC
  Filled 2018-11-19: qty 20

## 2018-11-19 MED ORDER — FENTANYL CITRATE (PF) 100 MCG/2ML IJ SOLN
25.0000 ug | INTRAMUSCULAR | Status: DC | PRN
  Administered 2018-11-19: 50 ug via INTRAVENOUS

## 2018-11-19 MED ORDER — CELECOXIB 200 MG PO CAPS
200.0000 mg | ORAL_CAPSULE | ORAL | Status: AC
  Administered 2018-11-19: 200 mg via ORAL

## 2018-11-19 MED ORDER — FENTANYL CITRATE (PF) 100 MCG/2ML IJ SOLN
INTRAMUSCULAR | Status: AC
  Filled 2018-11-19: qty 2

## 2018-11-19 MED ORDER — PROPOFOL 10 MG/ML IV BOLUS
INTRAVENOUS | Status: DC | PRN
  Administered 2018-11-19: 150 mg via INTRAVENOUS

## 2018-11-19 MED ORDER — LACTATED RINGERS IV SOLN
INTRAVENOUS | Status: DC
  Administered 2018-11-19 (×3): via INTRAVENOUS

## 2018-11-19 MED ORDER — GABAPENTIN 300 MG PO CAPS
300.0000 mg | ORAL_CAPSULE | ORAL | Status: AC
  Administered 2018-11-19: 300 mg via ORAL

## 2018-11-19 MED ORDER — MIDAZOLAM HCL 2 MG/2ML IJ SOLN
1.0000 mg | INTRAMUSCULAR | Status: DC | PRN
  Administered 2018-11-19: 2 mg via INTRAVENOUS

## 2018-11-19 MED ORDER — SUGAMMADEX SODIUM 200 MG/2ML IV SOLN
INTRAVENOUS | Status: DC | PRN
  Administered 2018-11-19: 150 mg via INTRAVENOUS

## 2018-11-19 SURGICAL SUPPLY — 90 items
BAG DECANTER FOR FLEXI CONT (MISCELLANEOUS) ×4 IMPLANT
BINDER ABDOMINAL 10 UNV 27-48 (MISCELLANEOUS) IMPLANT
BINDER ABDOMINAL 12 SM 30-45 (SOFTGOODS) IMPLANT
BINDER BREAST 3XL (GAUZE/BANDAGES/DRESSINGS) IMPLANT
BINDER BREAST LRG (GAUZE/BANDAGES/DRESSINGS) IMPLANT
BINDER BREAST MEDIUM (GAUZE/BANDAGES/DRESSINGS) IMPLANT
BINDER BREAST XLRG (GAUZE/BANDAGES/DRESSINGS) ×4 IMPLANT
BINDER BREAST XXLRG (GAUZE/BANDAGES/DRESSINGS) IMPLANT
BLADE SURG 10 STRL SS (BLADE) ×8 IMPLANT
BLADE SURG 11 STRL SS (BLADE) IMPLANT
BNDG GAUZE ELAST 4 BULKY (GAUZE/BANDAGES/DRESSINGS) ×8 IMPLANT
CANISTER LIPO FAT HARVEST (MISCELLANEOUS) IMPLANT
CANISTER SUCT 1200ML W/VALVE (MISCELLANEOUS) ×8 IMPLANT
CHLORAPREP W/TINT 26ML (MISCELLANEOUS) ×8 IMPLANT
COVER BACK TABLE 60X90IN (DRAPES) ×4 IMPLANT
COVER MAYO STAND STRL (DRAPES) ×4 IMPLANT
COVER WAND RF STERILE (DRAPES) IMPLANT
DECANTER SPIKE VIAL GLASS SM (MISCELLANEOUS) IMPLANT
DERMABOND ADVANCED (GAUZE/BANDAGES/DRESSINGS) ×4
DERMABOND ADVANCED .7 DNX12 (GAUZE/BANDAGES/DRESSINGS) ×4 IMPLANT
DRAIN CHANNEL 15F RND FF W/TCR (WOUND CARE) IMPLANT
DRAPE TOP ARMCOVERS (MISCELLANEOUS) ×4 IMPLANT
DRAPE U-SHAPE 76X120 STRL (DRAPES) ×4 IMPLANT
DRSG PAD ABDOMINAL 8X10 ST (GAUZE/BANDAGES/DRESSINGS) ×8 IMPLANT
ELECT BLADE 4.0 EZ CLEAN MEGAD (MISCELLANEOUS)
ELECT COATED BLADE 2.86 ST (ELECTRODE) ×4 IMPLANT
ELECT REM PT RETURN 9FT ADLT (ELECTROSURGICAL) ×4
ELECTRODE BLDE 4.0 EZ CLN MEGD (MISCELLANEOUS) IMPLANT
ELECTRODE REM PT RTRN 9FT ADLT (ELECTROSURGICAL) ×2 IMPLANT
EVACUATOR SILICONE 100CC (DRAIN) IMPLANT
GLOVE BIO SURGEON STRL SZ 6 (GLOVE) ×12 IMPLANT
GLOVE BIO SURGEON STRL SZ7 (GLOVE) ×12 IMPLANT
GLOVE BIOGEL PI IND STRL 7.5 (GLOVE) ×4 IMPLANT
GLOVE BIOGEL PI INDICATOR 7.5 (GLOVE) ×4
GOWN STRL REUS W/ TWL LRG LVL3 (GOWN DISPOSABLE) ×2 IMPLANT
GOWN STRL REUS W/ TWL XL LVL3 (GOWN DISPOSABLE) ×2 IMPLANT
GOWN STRL REUS W/TWL LRG LVL3 (GOWN DISPOSABLE) ×2
GOWN STRL REUS W/TWL XL LVL3 (GOWN DISPOSABLE) ×2
IMPL BREAST P6: XFULL RND 445 (Breast) ×2 IMPLANT
IMPL BREAST SILICONE 470CC (Breast) ×2 IMPLANT
IMPL BRST P6: XFULL RND 445CC (Breast) ×2 IMPLANT
IMPLANT BREAST GEL 445CC (Breast) ×2 IMPLANT
IMPLANT BREAST SILICONE 470CC (Breast) ×4 IMPLANT
IV NS 500ML (IV SOLUTION)
IV NS 500ML BAXH (IV SOLUTION) IMPLANT
KIT FILL SYSTEM UNIVERSAL (SET/KITS/TRAYS/PACK) IMPLANT
LINER CANISTER 1000CC FLEX (MISCELLANEOUS) IMPLANT
MARKER SKIN DUAL TIP RULER LAB (MISCELLANEOUS) IMPLANT
NDL SAFETY ECLIPSE 18X1.5 (NEEDLE) IMPLANT
NEEDLE FILTER BLUNT 18X 1/2SAF (NEEDLE) ×2
NEEDLE FILTER BLUNT 18X1 1/2 (NEEDLE) ×2 IMPLANT
NEEDLE HYPO 18GX1.5 SHARP (NEEDLE)
NEEDLE HYPO 25X1 1.5 SAFETY (NEEDLE) IMPLANT
NS IRRIG 1000ML POUR BTL (IV SOLUTION) IMPLANT
PACK BASIN DAY SURGERY FS (CUSTOM PROCEDURE TRAY) ×4 IMPLANT
PAD ALCOHOL SWAB (MISCELLANEOUS) ×4 IMPLANT
PENCIL BUTTON HOLSTER BLD 10FT (ELECTRODE) ×4 IMPLANT
PIN SAFETY STERILE (MISCELLANEOUS) IMPLANT
PUNCH BIOPSY DERMAL 4MM (MISCELLANEOUS) IMPLANT
SHEET MEDIUM DRAPE 40X70 STRL (DRAPES) ×4 IMPLANT
SIZER BREAST REUSE 420CC (SIZER) ×4
SIZER BREAST REUSE 445CC (SIZER) ×4
SIZER BREAST REUSE 470CC (SIZER) ×4
SIZER BRST REUSE 420CC (SIZER) ×2 IMPLANT
SIZER BRST REUSE 445CC (SIZER) ×2 IMPLANT
SIZER BRST REUSE 470CC (SIZER) ×2 IMPLANT
SLEEVE SCD COMPRESS KNEE MED (MISCELLANEOUS) ×4 IMPLANT
SPONGE LAP 18X18 RF (DISPOSABLE) ×8 IMPLANT
STAPLER VISISTAT 35W (STAPLE) ×4 IMPLANT
SUT ETHILON 2 0 FS 18 (SUTURE) IMPLANT
SUT MNCRL AB 4-0 PS2 18 (SUTURE) ×12 IMPLANT
SUT PDS AB 2-0 CT2 27 (SUTURE) IMPLANT
SUT VIC AB 3-0 PS1 18 (SUTURE)
SUT VIC AB 3-0 PS1 18XBRD (SUTURE) IMPLANT
SUT VIC AB 3-0 SH 27 (SUTURE) ×6
SUT VIC AB 3-0 SH 27X BRD (SUTURE) ×6 IMPLANT
SUT VICRYL 4-0 PS2 18IN ABS (SUTURE) ×8 IMPLANT
SYR 10ML LL (SYRINGE) IMPLANT
SYR 20CC LL (SYRINGE) ×4 IMPLANT
SYR 50ML LL SCALE MARK (SYRINGE) IMPLANT
SYR BULB IRRIGATION 50ML (SYRINGE) ×8 IMPLANT
SYR CONTROL 10ML LL (SYRINGE) IMPLANT
SYR TB 1ML LL NO SAFETY (SYRINGE) IMPLANT
TOWEL GREEN STERILE FF (TOWEL DISPOSABLE) ×8 IMPLANT
TUBE CONNECTING 20'X1/4 (TUBING) ×2
TUBE CONNECTING 20X1/4 (TUBING) ×6 IMPLANT
TUBING INFILTRATION IT-10001 (TUBING) IMPLANT
TUBING SET GRADUATE ASPIR 12FT (MISCELLANEOUS) ×4 IMPLANT
UNDERPAD 30X30 (UNDERPADS AND DIAPERS) ×8 IMPLANT
YANKAUER SUCT BULB TIP NO VENT (SUCTIONS) ×4 IMPLANT

## 2018-11-19 NOTE — Interval H&P Note (Signed)
History and Physical Interval Note:  11/19/2018 10:38 AM  [name]  has presented today for surgery, with the diagnosis of history breast cancer, aquired absence breasts  The various methods of treatment have been discussed with the patient and family. After consideration of risks, benefits and other options for treatment, the patient has consented to  Removal bilateral chest tissue expanders and placement silicone implants, removal right chest port a surgical intervention .  The patient's history has been reviewed, patient examined, no change in status, stable for surgery.  I have reviewed the patient's chart and labs.  Questions were answered to the patient's satisfaction.     Arnoldo Hooker Kamara Allan

## 2018-11-19 NOTE — Op Note (Signed)
Operative Note   DATE OF OPERATION: 11.26.2019  LOCATION: [facility]out[facility]  SURGICAL DIVISION: Plastic Surgery  PREOPERATIVE DIAGNOSES:  1. History breast cancer 2. Acquired absence breast  POSTOPERATIVE DIAGNOSES:  same  PROCEDURE:  1. Removal right chest port 2. Removal bilateral chest tissue expanders and placement silicone implants  SURGEON: Irene Limbo MD MBA  ASSISTANT: none  ANESTHESIA:  General.   EBL: 75 ml  COMPLICATIONS: None immediate.   INDICATIONS FOR PROCEDURE:  The patient, [name], is a 32 y.o. female born on 03/15/1986, is here for staged breast reconstruction following bilateral nipple sparing mastectomies, immediate prepectoral expander ADM reconstruction.   FINDINGS: Full incorporation ADM over right chest, partial incorporation over left chest with absent ADM over upper pole. Natrelle Inspira Extra Projection Smooth Round implants placed bilateral. RIGHT 470 ml REF SRX-470 [id number] LEFT 445 ml REF SRX-445 [id number]  DESCRIPTION OF PROCEDURE:  The patient's operative site was marked with the patient in the preoperative area. The patient was taken to the operating room. SCDs were placed and IV antibiotics were given. The patient's operative site was prepped and draped in a sterile fashion. A time out was performed and all information was confirmed to be correct. Incision made in left inframammary fold scar and carried through superficial fascia and ADM. Expander removed. As noted in findings partial incorporation of ADM noted with absent ADM over majority upper pole. Significant amount granulating tissue or developing capsule noted and with was treated with curettage. Capsulotomies performed medially and superiorly. Scoring of anterior ADM over lower pole completed. Sizer placed.  I then directed attention to right chest. Incision made scar over port. Incision carried through superficial fascia and capsule. Sutures removed and port removed  intact. Closure completed with 4-0 vicryl in dermis and superficial fascia followed by 4-0 monocryl subcuticular skin closure.  Incision made in right inframammary fold scar and carried through superficial fascia and ADM. Expander removed. Full incorporation ADM noted on this size. Right mastectomy flap thickness noted to be significantly thinner than left. Sizer placed. Patient brought to upright sitting position and assessed for symmetry. Natrelle Inspira Extra Projection 470 ml implant selected for right and 445 ml implant selected for left. Patient returned to supine position.  Each cavity irrigated with bacitracin, Ancef, gentamicinsolution.Hemostasis ensured.Each cavity then irrigated with Betadine. The implant was placed inrightchest andimplant orientation ensured. Closure completed with 3-0 vicryl to closesuperficial fascia and ADMover implant.4-0 vicryl used to close dermis followed by 4-0 monocryl subcuticular. Implant placed in left chest cavity.Closure completedin similar fashion. Tissue adhesive and dry dressing applied, followed by breastbinder.  The patient was allowed to wake from anesthesia, extubated and taken to the recovery room in satisfactory condition.   SPECIMENS: none  DRAINS: none  Irene Limbo, MD [facility] Plastic & Reconstructive Surgery [phone], pin [phone]

## 2018-11-19 NOTE — Anesthesia Procedure Notes (Signed)
Procedure Name: Intubation Date/Time: 11/19/2018 11:18 AM Performed by: Willa Frater, CRNA Pre-anesthesia Checklist: Patient identified, Emergency Drugs available, Suction available and Patient being monitored Patient Re-evaluated:Patient Re-evaluated prior to induction Oxygen Delivery Method: Circle system utilized Preoxygenation: Pre-oxygenation with 100% oxygen Induction Type: IV induction Ventilation: Mask ventilation without difficulty Laryngoscope Size: Miller and 3 Grade View: Grade I Tube type: Oral Tube size: 7.0 mm Number of attempts: 1 Airway Equipment and Method: Stylet and Oral airway Placement Confirmation: ETT inserted through vocal cords under direct vision,  positive ETCO2 and breath sounds checked- equal and bilateral Secured at: 21 cm Tube secured with: Tape Dental Injury: Teeth and Oropharynx as per pre-operative assessment

## 2018-11-19 NOTE — Discharge Instructions (Signed)

## 2018-11-19 NOTE — Transfer of Care (Signed)
Immediate Anesthesia Transfer of Care Note  Patient: [name]  Procedure(s) Performed: REMOVAL OF BILATERAL TISSUE EXPANDERS WITH PLACEMENT OF BILATERAL BREAST IMPLANTS (Bilateral Breast) PORTA CATH REMOVAL (Right Chest)  Patient Location: PACU  Anesthesia Type:General  Level of Consciousness: awake, alert , oriented and drowsy  Airway & Oxygen Therapy: Patient Spontanous Breathing and Patient connected to face mask oxygen  Post-op Assessment: Report given to RN and Post -op Vital signs reviewed and stable  Post vital signs: Reviewed and stable  Last Vitals:  Vitals Value Taken Time  BP 110/58 11/19/2018  1:30 PM  Temp    Pulse 104 11/19/2018  1:34 PM  Resp 23 11/19/2018  1:34 PM  SpO2 100 % 11/19/2018  1:34 PM  Vitals shown include unvalidated device data.  Last Pain:  Vitals:   11/19/18 1046  TempSrc: Oral  PainSc: 0-No pain         Complications: No apparent anesthesia complications

## 2018-11-19 NOTE — Anesthesia Preprocedure Evaluation (Signed)
Anesthesia Evaluation  Patient identified by MRN, date of birth, ID band Patient awake    Reviewed: Allergy & Precautions, H&P , NPO status , Patient's Chart, lab work & pertinent test results  History of Anesthesia Complications (+) PONV and history of anesthetic complications  Airway Mallampati: I  TM Distance: >3 FB     Dental no notable dental hx. (+) Teeth Intact   Pulmonary former smoker,    Pulmonary exam normal breath sounds clear to auscultation       Cardiovascular Exercise Tolerance: Good Normal cardiovascular exam Rhythm:regular Rate:Normal     Neuro/Psych  Headaches,    GI/Hepatic   Endo/Other  Hyperthyroidism   Renal/GU      Musculoskeletal   Abdominal Normal abdominal exam  (+)   Peds  Hematology   Anesthesia Other Findings [name]  ECHO COMPLETE WO IMAGING ENHANCING AGENT  Order# [id number]  Reading physician: Thayer Headings, MD Ordering physician: Nicholas Lose, MD Study date: 02/18/18 Study Result   Result status: Final result                             *[facility].                        [place], [place] [address]                            [phone]  ------------------------------------------------------------------- Transthoracic Echocardiography  Patient:    [name], [name] MR #:       [id number] Study Date: 02/18/2018 Gender:     F Age:        32 Height:     154.9 cm Weight:     56.8 kg BSA:        1.57 m^2 Pt. Status: Room:   PERFORMING   [facility], Outpatient  ATTENDING    Nicholas Lose [id number]  PVVZSMOL     MBEMLJ, [place], [place]  SONOGRAPHER  Roseanna Rainbow  cc:  ------------------------------------------------------------------- LV EF: 60% -   65%  -------------------------------------------------------------------    Reproductive/Obstetrics                             Anesthesia Physical  Anesthesia Plan  ASA: II  Anesthesia Plan: General   Post-op Pain Management:    Induction: Intravenous  PONV Risk Score and Plan: 4 or greater and Ondansetron, Dexamethasone, Scopolamine patch - Pre-op and Midazolam  Airway Management Planned: Oral ETT  Additional Equipment:   Intra-op Plan:   Post-operative Plan: Extubation in OR  Informed Consent: I have reviewed the patients History and Physical, chart, labs and discussed the procedure including the risks, benefits and alternatives for the proposed anesthesia with the patient or authorized representative who has indicated his/her understanding and acceptance.   Dental advisory given  Plan Discussed with: CRNA and Surgeon  Anesthesia Plan Comments:         Anesthesia Quick Evaluation

## 2018-11-19 NOTE — Anesthesia Postprocedure Evaluation (Signed)
Anesthesia Post Note  Patient: [name]  Procedure(s) Performed: REMOVAL OF BILATERAL TISSUE EXPANDERS WITH PLACEMENT OF BILATERAL BREAST IMPLANTS (Bilateral Breast) PORTA CATH REMOVAL (Right Chest)     Patient location during evaluation: PACU Anesthesia Type: General Level of consciousness: awake Pain management: pain level controlled Vital Signs Assessment: post-procedure vital signs reviewed and stable Respiratory status: spontaneous breathing Cardiovascular status: stable Postop Assessment: no apparent nausea or vomiting Anesthetic complications: no    Last Vitals:  Vitals:   11/19/18 1400 11/19/18 1415  BP: 112/65 110/66  Pulse: 94 (!) 105  Resp: 20 18  Temp:    SpO2: 100% 100%    Last Pain:  Vitals:   11/19/18 1400  TempSrc:   PainSc: 3    Pain Goal:                 Huston Foley

## 2018-11-20 ENCOUNTER — Encounter (HOSPITAL_BASED_OUTPATIENT_CLINIC_OR_DEPARTMENT_OTHER): Payer: Self-pay | Admitting: Plastic Surgery

## 2018-12-03 ENCOUNTER — Inpatient Hospital Stay: Payer: BLUE CROSS/BLUE SHIELD | Attending: Adult Health | Admitting: Adult Health

## 2018-12-03 ENCOUNTER — Encounter: Payer: Self-pay | Admitting: Adult Health

## 2018-12-03 ENCOUNTER — Telehealth: Payer: Self-pay | Admitting: Oncology

## 2018-12-03 NOTE — Telephone Encounter (Signed)
[name] with appointments for February: 02/13/19 labs at 3:45, pelvic US at 4:30 (arrival at 4:15), 02/17/19 follow up with Dr. Denman George at 3:45.  She verbalized understanding and agreement.

## 2018-12-04 ENCOUNTER — Telehealth: Payer: Self-pay | Admitting: Oncology

## 2018-12-04 NOTE — Telephone Encounter (Signed)
Entered in error

## 2019-01-27 DIAGNOSIS — H6990 Unspecified Eustachian tube disorder, unspecified ear: Secondary | ICD-10-CM | POA: Diagnosis not present

## 2019-01-27 DIAGNOSIS — J019 Acute sinusitis, unspecified: Secondary | ICD-10-CM | POA: Diagnosis not present

## 2019-01-27 DIAGNOSIS — Z6824 Body mass index (BMI) 24.0-24.9, adult: Secondary | ICD-10-CM | POA: Diagnosis not present

## 2019-01-31 ENCOUNTER — Inpatient Hospital Stay: Payer: Self-pay | Attending: Adult Health | Admitting: Adult Health

## 2019-01-31 ENCOUNTER — Telehealth: Payer: Self-pay | Admitting: Hematology and Oncology

## 2019-01-31 ENCOUNTER — Encounter: Payer: Self-pay | Admitting: Adult Health

## 2019-01-31 VITALS — BP 114/65 | HR 75 | Temp 98.8°F | Resp 18 | Ht 60.0 in | Wt 131.1 lb

## 2019-01-31 DIAGNOSIS — H9201 Otalgia, right ear: Secondary | ICD-10-CM | POA: Insufficient documentation

## 2019-01-31 DIAGNOSIS — Z87891 Personal history of nicotine dependence: Secondary | ICD-10-CM | POA: Insufficient documentation

## 2019-01-31 DIAGNOSIS — Z803 Family history of malignant neoplasm of breast: Secondary | ICD-10-CM | POA: Insufficient documentation

## 2019-01-31 DIAGNOSIS — M2550 Pain in unspecified joint: Secondary | ICD-10-CM | POA: Insufficient documentation

## 2019-01-31 DIAGNOSIS — Z171 Estrogen receptor negative status [ER-]: Secondary | ICD-10-CM | POA: Insufficient documentation

## 2019-01-31 DIAGNOSIS — C50211 Malignant neoplasm of upper-inner quadrant of right female breast: Secondary | ICD-10-CM | POA: Insufficient documentation

## 2019-01-31 DIAGNOSIS — Z1502 Genetic susceptibility to malignant neoplasm of ovary: Secondary | ICD-10-CM | POA: Insufficient documentation

## 2019-01-31 DIAGNOSIS — Z1501 Genetic susceptibility to malignant neoplasm of breast: Secondary | ICD-10-CM

## 2019-01-31 DIAGNOSIS — H9202 Otalgia, left ear: Secondary | ICD-10-CM | POA: Insufficient documentation

## 2019-01-31 NOTE — Progress Notes (Signed)
CLINIC:  Survivorship   REASON FOR VISIT:  Routine follow-up post-treatment for a recent history of breast cancer.  BRIEF ONCOLOGIC HISTORY:    Malignant neoplasm of upper-inner quadrant of right breast in female, estrogen receptor negative ([place])   02/01/2018 Initial Diagnosis    Screening detected right breast mass in the upper inner quadrant measuring 1.5 cm, with additional enhancement it measured 2.2 cm.  No axillary lymph nodes.  Biopsy revealed IDC with DCIS, grade 3, ER 0%, PR 0%, HER-2 negative, Ki-67 80%, T1CN0 stage Ib clinical stage    02/20/2018 Genetic Testing    BRCA1 c.2722G>T pathogenic mutation and MLH1 c.979C>G and RAD51C c.506T>C VUS identified on the common hereditary cancer panel.  The Hereditary Gene Panel offered by [facility] includes sequencing and/or deletion duplication testing of the following 47 genes: APC, ATM, AXIN2, BARD1, BMPR1A, BRCA1, BRCA2, BRIP1, CDH1, CDK4, CDKN2A (p14ARF), CDKN2A (p16INK4a), CHEK2, CTNNA1, DICER1, EPCAM (Deletion/duplication testing only), GREM1 (promoter region deletion/duplication testing only), KIT, MEN1, MLH1, MSH2, MSH3, MSH6, MUTYH, NBN, NF1, NHTL1, PALB2, PDGFRA, PMS2, POLD1, POLE, PTEN, RAD50, RAD51C, RAD51D, SDHB, SDHC, SDHD, SMAD4, SMARCA4. STK11, TP53, TSC1, TSC2, and VHL.  The following genes were evaluated for sequence changes only: SDHA and HOXB13 c.251G>A variant only. The report date is February 20, 2018.     02/20/2018 - 07/05/2018 Neo-Adjuvant Chemotherapy    Neoadjuvant dose dense Adriamycin and Cytoxan x4 followed by Taxol and carboplatin (received 8 cycles of Taxol and 3 cycles of carboplatin)    07/29/2018 Surgery    Bilateral mastectomies: Right mastectomy benign, 0/6 lymph nodes; left mastectomy: Benign, pathologic complete response     INTERVAL HISTORY:  [name] presents to the [facility] today for our initial meeting to review her survivorship care plan detailing her treatment course for breast cancer, as  well as monitoring long-term side effects of that treatment, education regarding health maintenance, screening, and overall wellness and health promotion.     Overall, [name] reports feeling moderately well.  She notes increasing achiness since she has completed chemotherapy and fatigue that is worse at times and she has to go and sleep.  She is concerned about her continued pain.  [name] also has BRCA 1 positivity and follows with our GYN oncology group.  She has an appt in February for repeat ultrasound and CA-125 testing.  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    She notes that she has continued to have worsening pain in her ears, and ? Fluid in her ears.  She has had several PCP visits and has undergone several rounds of antibiotics.  She also had ENT visits with two different specialists.  Most recently there is concern that the pain is not from an infection, or fluid, but from some underlying etiology in the area.  A CT scan was ordered and she is going to do this next week.      REVIEW OF SYSTEMS:  Review of Systems  Constitutional: Positive for fatigue. Negative for appetite change, chills and unexpected weight change.  HENT:   Negative for hearing loss.   Eyes: Negative for eye problems and icterus.  Respiratory: Negative for chest tightness, cough and shortness of breath.   Cardiovascular: Negative for chest pain, leg swelling and palpitations.  Gastrointestinal: Negative for abdominal distention, abdominal pain, constipation, diarrhea, nausea and vomiting.  Endocrine: Negative for hot flashes.  Musculoskeletal: Negative for arthralgias.  Skin: Negative for itching and rash.  Neurological: Negative for dizziness, extremity weakness and numbness.  Hematological: Negative for adenopathy. Does not bruise/bleed easily.  Psychiatric/Behavioral: Negative for depression. The patient is not  nervous/anxious.   Breast: Denies any new nodularity, masses, tenderness, nipple changes, or nipple discharge.      ONCOLOGY TREATMENT TEAM:  1. Surgeon:  Dr. Donne Hazel at [facility] 2. Medical Oncologist: Dr. Lindi Adie     PAST MEDICAL/SURGICAL HISTORY:  Past Medical History:  Diagnosis Date  . Cluster headaches   . Complication of anesthesia    severe hypotension to the point of syncope  . Family history of breast cancer   . Family history of colon cancer   . Family history of stomach cancer   . History of breast cancer 01/2018   right  . History of chemotherapy    finished 07/05/2018  . History of palpitations    due to low K+  . Hyperthyroidism    no current med.  . Infection of left breast 11/11/2018   to start antibiotic 11/11/2018  . Irritable bowel syndrome (IBS)    no current med.   Past Surgical History:  Procedure Laterality Date  . BREAST RECONSTRUCTION WITH PLACEMENT OF TISSUE EXPANDER AND ALLODERM Bilateral 07/29/2018   Procedure: BILATERAL BREAST RECONSTRUCTION WITH PLACEMENT OF TISSUE EXPANDER AND ALLODERM;  Surgeon: Irene Limbo, MD;  Location: [facility];  Service: Plastics;  Laterality: Bilateral;  . CESAREAN SECTION    . CESAREAN SECTION WITH BILATERAL TUBAL LIGATION Bilateral 11/19/2013   Procedure: REPEAT CESAREAN SECTION WITH BILATERAL TUBAL LIGATION; TWINS;  Surgeon: Lovenia Kim, MD;  Location: [facility];  Service: Obstetrics;  Laterality: Bilateral;  EDD: 12/09/13  . CHOLECYSTECTOMY    . PORTA CATH REMOVAL Right 11/19/2018   Procedure: PORTA CATH REMOVAL;  Surgeon: Irene Limbo, MD;  Location: [facility];  Service: Plastics;  Laterality: Right;  . PORTACATH PLACEMENT N/A 02/20/2018   Procedure: INSERTION PORT-A-CATH WITH ULTRASOUND;  Surgeon: Rolm Bookbinder, MD;  Location: [facility];  Service: General;  Laterality: N/A;  . REMOVAL OF BILATERAL TISSUE EXPANDERS WITH PLACEMENT OF  BILATERAL BREAST IMPLANTS Bilateral 11/19/2018   Procedure: REMOVAL OF BILATERAL TISSUE EXPANDERS WITH PLACEMENT OF BILATERAL BREAST IMPLANTS;  Surgeon: Irene Limbo, MD;  Location: [facility];  Service: Plastics;  Laterality: Bilateral;     ALLERGIES:  Allergies  Allergen Reactions  . Lidocaine Other (See Comments)    NUMBNESS AND TINGLING ARMS AND LEGS     CURRENT MEDICATIONS:  Outpatient Encounter Medications as of 01/31/2019  Medication Sig  . Cyclobenzaprine HCl (FLEXERIL PO) Take by mouth at bedtime.  Marland Kitchen ibuprofen (ADVIL,MOTRIN) 200 MG tablet Take 200 mg by mouth every 6 (six) hours as needed.  . [DISCONTINUED] sulfamethoxazole-trimethoprim (BACTRIM,SEPTRA) 400-80 MG tablet Take 1 tablet by mouth 2 (two) times daily.   No facility-administered encounter medications on file as of 01/31/2019.      ONCOLOGIC FAMILY HISTORY:  Family History  Problem Relation Age of Onset  . Cirrhosis Father   . Breast cancer Paternal Aunt 51       BRCA1 p.E908 pos  . Heart disease Maternal Grandfather        Heart attack  . Breast cancer Paternal Aunt        dx in her 68s  . Colon cancer Maternal Aunt        dx in her 63s  . Lupus Sister  pat 1/2 sister  . Other Brother 39       pat 1/2 brother died in [place]  . Lung cancer Maternal Uncle        smoker  . Other Paternal Aunt        died in a MVA  . Other Paternal Aunt        died from poisioning     GENETIC COUNSELING/TESTING: BRCA positive  SOCIAL HISTORY:  Social History   Socioeconomic History  . Marital status: Married    Spouse name: Not on file  . Number of children: Not on file  . Years of education: Not on file  . Highest education level: Not on file  Occupational History  . Not on file  Social Needs  . Financial resource strain: Not on file  . Food insecurity:    Worry: Not on file    Inability: Not on file  . Transportation needs:    Medical: Not on file    Non-medical: Not on file    Tobacco Use  . Smoking status: Former Smoker    Last attempt to quit: 12/25/2003    Years since quitting: 15.1  . Smokeless tobacco: Never Used  Substance and Sexual Activity  . Alcohol use: No  . Drug use: No  . Sexual activity: Not Currently  Lifestyle  . Physical activity:    Days per week: Not on file    Minutes per session: Not on file  . Stress: Not on file  Relationships  . Social connections:    Talks on phone: Not on file    Gets together: Not on file    Attends religious service: Not on file    Active member of club or organization: Not on file    Attends meetings of clubs or organizations: Not on file    Relationship status: Not on file  . Intimate partner violence:    Fear of current or ex partner: Not on file    Emotionally abused: Not on file    Physically abused: Not on file    Forced sexual activity: Not on file  Other Topics Concern  . Not on file  Social History Narrative  . Not on file      PHYSICAL EXAMINATION:  Vital Signs:   Vitals:   01/31/19 1455  BP: 114/65  Pulse: 75  Resp: 18  Temp: 98.8 F (37.1 C)  SpO2: 100%   Filed Weights   01/31/19 1455  Weight: 131 lb 1.6 oz (59.5 kg)   General: Well-nourished, well-appearing female in no acute distress.  She is unaccompanied today.   HEENT: Head is normocephalic.  Pupils equal and reactive to light. Conjunctivae clear without exudate.  Sclerae anicteric. Oral mucosa is pink, moist.  Oropharynx is pink without lesions or erythema.  Lymph: No cervical, supraclavicular, or infraclavicular lymphadenopathy noted on palpation.  Cardiovascular: Regular rate and rhythm.Marland Kitchen Respiratory: Clear to auscultation bilaterally. Chest expansion symmetric; breathing non-labored.  Breasts: s/p bilateral mastectomy and reconstruction.  No sign of local recurrence.   GI: Abdomen soft and round; non-tender, non-distended. Bowel sounds normoactive.  GU: Deferred.  Neuro: No focal deficits. Steady gait.  Psych:  Mood and affect normal and appropriate for situation.  Extremities: No edema. MSK: No focal spinal tenderness to palpation.  Full range of motion in bilateral upper extremities Skin: Warm and dry.  LABORATORY DATA:  None for this visit.  DIAGNOSTIC IMAGING:  None for this visit.      ASSESSMENT  AND PLAN:  [name] is a pleasant 33 y.o. female with Stage IB right breast invasive ductal carcinoma, ER-/PR-/HER2-, diagnosed in 01/2018, treated with neoadjuvant chemotherapy and bilateral mastectomies.  She presents to the Survivorship Clinic for our initial meeting and routine follow-up post-completion of treatment for breast cancer.    1. Stage IB right breast cancer:  [name] is continuing to recover from definitive treatment for breast cancer. She will follow-up with her medical oncologist, Dr. Lindi Adie in 06/2019 with history and physical exam per surveillance protocol.  Today, a comprehensive survivorship care plan and treatment summary was reviewed with the patient today detailing her breast cancer diagnosis, treatment course, potential late/long-term effects of treatment, appropriate follow-up care with recommendations for the future, and patient education resources.  A copy of this summary, along with a letter will be sent to the patient's primary care provider via mail/fax/In Basket message after today's visit.    2. BRCA1 positivity: following with GYN oncology.  Will undergo continued surveillance until she can have RRSO over the summer.    3. Arthralgias: I added on some labs to test when she returns on 02/13/2019, including an autoimmune panel.  I also ordered a bone scan.  4. ENT concerns: This is an unusual occurrence.  I asked [name] to please have [place] copied on the notes from the ENT specialist and have any tests done outside the [facility] system forwarded so we can review.   5. Cancer screening:  Due to [name]'s history and her age, she should receive screening for skin cancers,  and gynecologic cancers.  The information and recommendations are listed on the patient's comprehensive care plan/treatment summary and were reviewed in detail with the patient.    6. Health maintenance and wellness promotion: [name] was encouraged to consume 5-7 servings of fruits and vegetables per day. We reviewed the "Nutrition Rainbow" handout, as well as the handout "Take Control of Your Health and Reduce Your Cancer Risk" from the [facility].  She was also encouraged to engage in moderate to vigorous exercise for 30 minutes per day most days of the week. We discussed the [employer] fitness program, which is designed for cancer survivors to help them become more physically fit after cancer treatments.  She was instructed to limit her alcohol consumption and continue to abstain from tobacco use.     7. Support services/counseling: It is not uncommon for this period of the patient's cancer care trajectory to be one of many emotions and stressors.  We discussed an opportunity for her to participate in the next session of [facility] ("Finding Your New Normal") support group series designed for patients after they have completed treatment.   [name] was encouraged to take advantage of our many other support services programs, support groups, and/or counseling in coping with her new life as a cancer survivor after completing anti-cancer treatment.  She was offered support today through active listening and expressive supportive counseling.  She was given information regarding our available services and encouraged to contact me with any questions or for help enrolling in any of our support group/programs.    Dispo:   -Return to cancer center in 06/2019 for f/u with Dr. Lindi Adie -Follow up with surgery 11/2019 -She is welcome to return back to the Survivorship Clinic at any time; no additional follow-up needed at this time.  -Consider referral back to survivorship as a long-term survivor for  continued surveillance  A total of (30) minutes of face-to-face  time was spent with this patient with greater than 50% of that time in counseling and care-coordination.   Gardenia Phlegm, NP Survivorship Program [place] [phone]   Note: PRIMARY CARE PROVIDER System, Pcp Not In None None

## 2019-01-31 NOTE — Telephone Encounter (Signed)
Gave avs and calendar ° °

## 2019-02-05 ENCOUNTER — Telehealth (HOSPITAL_COMMUNITY): Payer: Self-pay

## 2019-02-11 ENCOUNTER — Telehealth: Payer: Self-pay | Admitting: *Deleted

## 2019-02-11 NOTE — Telephone Encounter (Signed)
Patient called back and moved her appt to Friday afternoon

## 2019-02-11 NOTE — Telephone Encounter (Signed)
Called and left the patient a message to call the office. Need to move her appt from Monday to either earlier in the day or Friday afternoon

## 2019-02-13 ENCOUNTER — Inpatient Hospital Stay: Payer: Self-pay

## 2019-02-13 ENCOUNTER — Ambulatory Visit (HOSPITAL_COMMUNITY)
Admission: RE | Admit: 2019-02-13 | Discharge: 2019-02-13 | Disposition: A | Discharge status: Home / Self Care | Payer: BLUE CROSS/BLUE SHIELD | Source: Ambulatory Visit | Attending: Gynecologic Oncology | Admitting: Gynecologic Oncology

## 2019-02-13 DIAGNOSIS — Z1501 Genetic susceptibility to malignant neoplasm of breast: Secondary | ICD-10-CM | POA: Insufficient documentation

## 2019-02-13 DIAGNOSIS — Z1509 Genetic susceptibility to other malignant neoplasm: Secondary | ICD-10-CM

## 2019-02-13 DIAGNOSIS — C50211 Malignant neoplasm of upper-inner quadrant of right female breast: Secondary | ICD-10-CM

## 2019-02-13 DIAGNOSIS — Z171 Estrogen receptor negative status [ER-]: Principal | ICD-10-CM

## 2019-02-13 DIAGNOSIS — C50919 Malignant neoplasm of unspecified site of unspecified female breast: Secondary | ICD-10-CM | POA: Diagnosis not present

## 2019-02-13 LAB — CBC WITH DIFFERENTIAL (CANCER CENTER ONLY)
Abs Immature Granulocytes: 0.02 10*3/uL (ref 0.00–0.07)
BASOS ABS: 0 10*3/uL (ref 0.0–0.1)
Basophils Relative: 0 %
EOS ABS: 0.2 10*3/uL (ref 0.0–0.5)
Eosinophils Relative: 4 %
HCT: 38.8 % (ref 36.0–46.0)
Hemoglobin: 12.8 g/dL (ref 12.0–15.0)
IMMATURE GRANULOCYTES: 0 %
Lymphocytes Relative: 25 %
Lymphs Abs: 1.5 10*3/uL (ref 0.7–4.0)
MCH: 29.4 pg (ref 26.0–34.0)
MCHC: 33 g/dL (ref 30.0–36.0)
MCV: 89 fL (ref 80.0–100.0)
Monocytes Absolute: 0.4 10*3/uL (ref 0.1–1.0)
Monocytes Relative: 6 %
NEUTROS ABS: 3.9 10*3/uL (ref 1.7–7.7)
Neutrophils Relative %: 65 %
Platelet Count: 286 10*3/uL (ref 150–400)
RBC: 4.36 MIL/uL (ref 3.87–5.11)
RDW: 11.9 % (ref 11.5–15.5)
WBC Count: 5.9 10*3/uL (ref 4.0–10.5)
nRBC: 0 % (ref 0.0–0.2)

## 2019-02-13 LAB — CMP (CANCER CENTER ONLY)
ALBUMIN: 4.6 g/dL (ref 3.5–5.0)
ALK PHOS: 74 U/L (ref 38–126)
ALT: 8 U/L (ref 0–44)
AST: 12 U/L — AB (ref 15–41)
Anion gap: 8 (ref 5–15)
BUN: 10 mg/dL (ref 6–20)
CO2: 30 mmol/L (ref 22–32)
Calcium: 9.5 mg/dL (ref 8.9–10.3)
Chloride: 103 mmol/L (ref 98–111)
Creatinine: 0.73 mg/dL (ref 0.44–1.00)
GFR, Est AFR Am: >60 mL/min (ref >60)
GFR, Estimated: >60 mL/min (ref >60)
GLUCOSE: 80 mg/dL (ref 70–99)
POTASSIUM: 3.4 mmol/L — AB (ref 3.5–5.1)
Sodium: 141 mmol/L (ref 135–145)
TOTAL PROTEIN: 7.8 g/dL (ref 6.5–8.1)
Total Bilirubin: 0.3 mg/dL (ref 0.3–1.2)

## 2019-02-13 LAB — C-REACTIVE PROTEIN: CRP: 0.9 mg/dL (ref <1.0)

## 2019-02-14 ENCOUNTER — Encounter: Payer: Self-pay | Admitting: Gynecologic Oncology

## 2019-02-14 ENCOUNTER — Inpatient Hospital Stay (HOSPITAL_BASED_OUTPATIENT_CLINIC_OR_DEPARTMENT_OTHER): Payer: Self-pay | Admitting: Gynecologic Oncology

## 2019-02-14 VITALS — BP 106/69 | HR 80 | Temp 98.6°F | Resp 20 | Ht 60.0 in | Wt 131.5 lb

## 2019-02-14 DIAGNOSIS — C50211 Malignant neoplasm of upper-inner quadrant of right female breast: Secondary | ICD-10-CM

## 2019-02-14 DIAGNOSIS — Z1509 Genetic susceptibility to other malignant neoplasm: Principal | ICD-10-CM

## 2019-02-14 DIAGNOSIS — Z1502 Genetic susceptibility to malignant neoplasm of ovary: Secondary | ICD-10-CM

## 2019-02-14 DIAGNOSIS — Z1501 Genetic susceptibility to malignant neoplasm of breast: Secondary | ICD-10-CM

## 2019-02-14 DIAGNOSIS — Z171 Estrogen receptor negative status [ER-]: Secondary | ICD-10-CM

## 2019-02-14 DIAGNOSIS — Z803 Family history of malignant neoplasm of breast: Secondary | ICD-10-CM

## 2019-02-14 LAB — ANTINUCLEAR ANTIBODIES, IFA: ANA Ab, IFA: NEGATIVE

## 2019-02-14 LAB — CA 125: CANCER ANTIGEN (CA) 125: 15.7 U/mL (ref 0.0–38.1)

## 2019-02-14 LAB — RHEUMATOID FACTOR: Rhuematoid fact SerPl-aCnc: <10 IU/mL (ref 0.0–13.9)

## 2019-02-14 NOTE — Progress Notes (Signed)
Follow-up Note: Gyn-Onc  Consult was initially requested by Dr. Lindi Adie for the evaluation of [name] 33 y.o. female  CC:  Chief Complaint  Patient presents with  . BRCA1 gene mutation positive    Assessment/Plan:  Ms. [name]  is a 33 y.o.  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  She desires risk reducing hysterectomy, BSO. This will be scheduled for 6 weeks following her breast reconstruction revision. She will notify our office regarding the timing.  She has aching in her arms and leg muscles. Recent labs normal except for potassium - recommended potassium rich foods.  She has a bone scan scheduled for next week.   HPI: [name] is a 33 year old G2P3 who is seen in consultation at the request of Dr Lindi Adie for a deleterious mutation in BRCA 1 and stage IB triple negative breast cancer.  Patient was diagnosed with breast cancer in February 2019.  This is been treated to date with neoadjuvant chemotherapy which she has completed on July 08, 2018.  She tolerated therapy fairly well.  The patient has a strong family history for breast cancer including both aunts who noted to be BRCA1 positive.  She has no known family history for ovarian cancer.  Her aunts are on the paternal side.  She does not have a relationship with her father.  She has siblings who have not been tested and have no history of cancer.  She underwent genetic testing herself on February 20, 2018.  This revealed a deleterious  mutation in BRCA1 as well as variance of uncertain significance identified in MLH1 and RAD51C.  She is planning to undergo definitive risk reducing bilateral mastectomies and reconstruction, ideally in this coming month with Dr. Donne Hazel.  Patient's gynecologic history is significant for amenorrhea since March 2019 when she  commenced chemotherapy.  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  During that time she was diagnosed with endometriosis.  She underwent Clomid therapy and conceived twins spontaneously in 2014.  Prior to that she had a delivery of full-term infant approximately 14 years ago via cesarean section.  Her twins were also delivered via cesarean section.  She is a history of a tubal ligation was performed at the time of cesarean cesarean section.  Her only other prior surgeries were laparoscopies performed at the time of infertility treatment.  She has no prior history of abnormal Pap smears.  Her family history is significant for a paternal aunt who developed breast cancer in her 99s and was noted to be BRCA 1 germline positive.  An alternative paternal aunt also died of breast cancer in her 53s and was also BRCA1 positive.  A maternal aunt has a history of colon cancer in her 5s.  A maternal uncle died from lung cancer, he was a smoker.  CT scan in February, 2019 showed normal ovaries and uterus. CA 125 on 07/08/18 was normal at 9.4.  Interval Hx:  CA 125 pm 02/13/19 was normal at 15.7.   She underwent breast reconstructive surgery in November, 2019. She requires a revision of one of the reconstruction sites and this is planned for April.   She desires risk reducing hysterectomy, BSO in May late 2020.   TVUS on 02/13/19 showed a 9.5cm uterus with blood clot  within the cavity. Ovaries bilaterally were small and normal.   Current Meds:  Outpatient Encounter Medications as of 02/14/2019  Medication Sig  . fluticasone (FLONASE) 50 MCG/ACT nasal spray Place 2 sprays into both nostrils daily.  Marland Kitchen ibuprofen (ADVIL,MOTRIN) 200 MG tablet Take 200 mg by mouth every 6 (six) hours as needed.  . loratadine (CLARITIN) 10 MG tablet Take 10 mg by mouth daily.  . [DISCONTINUED] Cyclobenzaprine HCl (FLEXERIL PO) Take by mouth at bedtime.   No  facility-administered encounter medications on file as of 02/14/2019.     Allergy:  Allergies  Allergen Reactions  . Lidocaine Other (See Comments)    NUMBNESS AND TINGLING ARMS AND LEGS    Social Hx:   Social History   Socioeconomic History  . Marital status: Married    Spouse name: Not on file  . Number of children: Not on file  . Years of education: Not on file  . Highest education level: Not on file  Occupational History  . Not on file  Social Needs  . Financial resource strain: Not on file  . Food insecurity:    Worry: Not on file    Inability: Not on file  . Transportation needs:    Medical: Not on file    Non-medical: Not on file  Tobacco Use  . Smoking status: Former Smoker    Last attempt to quit: 12/25/2003    Years since quitting: 15.1  . Smokeless tobacco: Never Used  Substance and Sexual Activity  . Alcohol use: No  . Drug use: No  . Sexual activity: Not Currently  Lifestyle  . Physical activity:    Days per week: Not on file    Minutes per session: Not on file  . Stress: Not on file  Relationships  . Social connections:    Talks on phone: Not on file    Gets together: Not on file    Attends religious service: Not on file    Active member of club or organization: Not on file    Attends meetings of clubs or organizations: Not on file    Relationship status: Not on file  . Intimate partner violence:    Fear of current or ex partner: Not on file    Emotionally abused: Not on file    Physically abused: Not on file    Forced sexual activity: Not on file  Other Topics Concern  . Not on file  Social History Narrative  . Not on file    Past Surgical Hx:  Past Surgical History:  Procedure Laterality Date  . BREAST RECONSTRUCTION WITH PLACEMENT OF TISSUE EXPANDER AND ALLODERM Bilateral 07/29/2018   Procedure: BILATERAL BREAST RECONSTRUCTION WITH PLACEMENT OF TISSUE EXPANDER AND ALLODERM;  Surgeon: Irene Limbo, MD;  Location: [facility];  Service: Plastics;  Laterality: Bilateral;  . CESAREAN SECTION    . CESAREAN SECTION WITH BILATERAL TUBAL LIGATION Bilateral 11/19/2013   Procedure: REPEAT CESAREAN SECTION WITH BILATERAL TUBAL LIGATION; TWINS;  Surgeon: Lovenia Kim, MD;  Location: [facility];  Service: Obstetrics;  Laterality: Bilateral;  EDD: 12/09/13  . CHOLECYSTECTOMY    . PORTA CATH REMOVAL Right 11/19/2018   Procedure: PORTA CATH REMOVAL;  Surgeon: Irene Limbo, MD;  Location: [facility];  Service: Plastics;  Laterality: Right;  . PORTACATH PLACEMENT N/A 02/20/2018   Procedure: INSERTION PORT-A-CATH WITH ULTRASOUND;  Surgeon: Rolm Bookbinder, MD;  Location: [facility];  Service: General;  Laterality: N/A;  . REMOVAL  OF BILATERAL TISSUE EXPANDERS WITH PLACEMENT OF BILATERAL BREAST IMPLANTS Bilateral 11/19/2018   Procedure: REMOVAL OF BILATERAL TISSUE EXPANDERS WITH PLACEMENT OF BILATERAL BREAST IMPLANTS;  Surgeon: Irene Limbo, MD;  Location: [facility];  Service: Plastics;  Laterality: Bilateral;    Past Medical Hx:  Past Medical History:  Diagnosis Date  . Cluster headaches   . Complication of anesthesia    severe hypotension to the point of syncope  . Family history of breast cancer   . Family history of colon cancer   . Family history of stomach cancer   . History of breast cancer 01/2018   right  . History of chemotherapy    finished 07/05/2018  . History of palpitations    due to low K+  . Hyperthyroidism    no current med.  . Infection of left breast 11/11/2018   to start antibiotic 11/11/2018  . Irritable bowel syndrome (IBS)    no current med.    Past Gynecological History:  [id number] No LMP recorded.  Family Hx:  Family History  Problem Relation Age of Onset  . Cirrhosis Father   . Breast cancer Paternal Aunt 75       BRCA1 [id number] pos  . Heart disease Maternal Grandfather        Heart attack  . Breast cancer Paternal Aunt         dx in her 98s  . Colon cancer Maternal Aunt        dx in her 69s  . Lupus Sister        pat 1/2 sister  . Other Brother 61       pat 1/2 brother died in [place]  . Lung cancer Maternal Uncle        smoker  . Other Paternal Aunt        died in a MVA  . Other Paternal Aunt        died from poisioning    Review of Systems:  Constitutional  Feels well,    ENT Normal appearing ears and nares bilaterally Skin/Breast  No rash, sores, jaundice, itching, dryness Cardiovascular  No chest pain, shortness of breath, or edema  Pulmonary  No cough or wheeze.  Gastro Intestinal  No nausea, vomitting, or diarrhoea. No bright red blood per rectum, no abdominal pain, change in bowel movement, or constipation.  Genito Urinary  No frequency, urgency, dysuria, no bleeding, amenorrhic Musculo Skeletal  No myalgia, arthralgia, joint swelling or pain  Neurologic  No weakness, numbness, change in gait,  Psychology  No depression, anxiety, insomnia.   Vitals:  Blood pressure 106/69, pulse 80, temperature 98.6 F (37 C), temperature source Oral, resp. rate 20, height 5' (1.524 m), weight 131 lb 8 oz (59.6 kg), SpO2 100 %.  Physical Exam: WD in NAD Neck  Supple NROM, without any enlargements.  Lymph Node Survey No cervical supraclavicular or inguinal adenopathy Cardiovascular  Pulse normal rate, regularity and rhythm. S1 and S2 normal.  Lungs  Clear to auscultation bilateraly, without wheezes/crackles/rhonchi. Good air movement.  Skin  No rash/lesions/breakdown  Psychiatry  Alert and oriented to person, place, and time  Abdomen  Normoactive bowel sounds, abdomen soft, non-tender and thin without evidence of hernia.  Back No CVA tenderness Genito Urinary  Vulva/vagina: Normal external female genitalia.   No lesions. No discharge or bleeding.  Bladder/urethra:  No lesions or masses, well supported bladder  Vagina: no palpable mass  Cervix: Normal appearing, no lesions.  Uterus:  Small, mobile, no parametrial involvement or nodularity.  Adnexa: no masses. Rectal  deferred Extremities  No bilateral cyanosis, clubbing or edema.   Thereasa Solo, MD  02/14/2019, 4:28 PM

## 2019-02-14 NOTE — Patient Instructions (Signed)
Please call Dr Serita Grit office at [phone] when you are ready to schedule your procedure (a robotic assisted total hysterectomy with removal of both tubes and ovaries).  Dr Denman George has scheduled a follow-up lab and ultrasound for May for you.

## 2019-02-17 ENCOUNTER — Encounter (HOSPITAL_COMMUNITY)
Admission: RE | Admit: 2019-02-17 | Discharge: 2019-02-17 | Disposition: A | Discharge status: Home / Self Care | Payer: BLUE CROSS/BLUE SHIELD | Source: Ambulatory Visit | Attending: Adult Health | Admitting: Adult Health

## 2019-02-17 ENCOUNTER — Ambulatory Visit: Payer: No Typology Code available for payment source | Admitting: Gynecologic Oncology

## 2019-02-17 DIAGNOSIS — Z171 Estrogen receptor negative status [ER-]: Secondary | ICD-10-CM | POA: Insufficient documentation

## 2019-02-17 DIAGNOSIS — C3490 Malignant neoplasm of unspecified part of unspecified bronchus or lung: Secondary | ICD-10-CM | POA: Diagnosis not present

## 2019-02-17 DIAGNOSIS — C50211 Malignant neoplasm of upper-inner quadrant of right female breast: Secondary | ICD-10-CM

## 2019-02-17 MED ORDER — TECHNETIUM TC 99M MEDRONATE IV KIT
20.7000 | PACK | Freq: Once | INTRAVENOUS | Status: AC | PRN
  Administered 2019-02-17: 20.7 via INTRAVENOUS

## 2019-02-18 ENCOUNTER — Encounter: Payer: Self-pay | Admitting: *Deleted

## 2019-02-18 ENCOUNTER — Telehealth: Payer: Self-pay

## 2019-02-18 NOTE — Telephone Encounter (Signed)
-----   Message from Gardenia Phlegm, NP sent at 02/18/2019  8:19 AM EST ----- Bone scan and lab testing negative.  Please let patient know. ----- Message ----- From: Interface, Rad Results In Sent: 02/17/2019   4:40 PM EST To: Gardenia Phlegm, NP

## 2019-02-18 NOTE — Telephone Encounter (Signed)
Spoke with patient to inform of negative bone scan and labs.  Patient voiced understanding and had no further issues at this time.

## 2019-03-14 DIAGNOSIS — R319 Hematuria, unspecified: Secondary | ICD-10-CM | POA: Diagnosis not present

## 2019-03-14 DIAGNOSIS — R350 Frequency of micturition: Secondary | ICD-10-CM | POA: Diagnosis not present

## 2019-03-14 DIAGNOSIS — R300 Dysuria: Secondary | ICD-10-CM | POA: Diagnosis not present

## 2019-03-14 DIAGNOSIS — Z6824 Body mass index (BMI) 24.0-24.9, adult: Secondary | ICD-10-CM | POA: Diagnosis not present

## 2019-04-06 IMAGING — CT CT CHEST W/ CM
2 of 4 series · 13 of 36 positions shown, 16 images · IV contrast (ISOVUE 300)
Comparison: None.

CLINICAL DATA: Right breast cancer staging.

EXAM:
CT CHEST, ABDOMEN, AND PELVIS WITH CONTRAST
TECHNIQUE: Multidetector CT imaging of the chest, abdomen and pelvis was
performed following the standard protocol during bolus
administration of intravenous contrast.
CONTRAST:  100mL 2IDFOZ-XCC IOPAMIDOL (2IDFOZ-XCC) INJECTION 61%,
30mL 2IDFOZ-XCC IOPAMIDOL (2IDFOZ-XCC) INJECTION 61%

[Series 2: cap with · axial · 0.68mm/px · z∈[-566,-50]mm · 10 of 125 slices shown, 13 images]
[im 11/125  mediastinal]
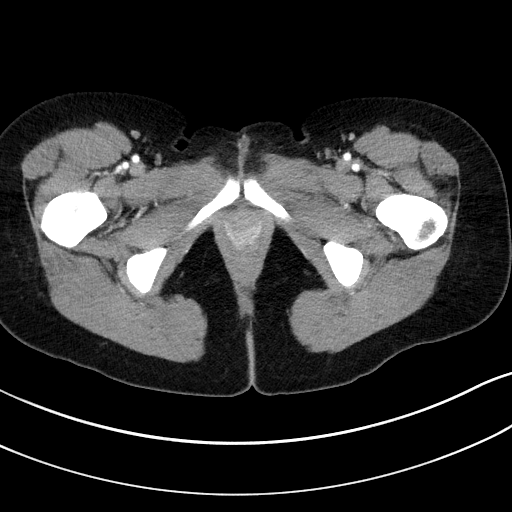
[im 11/125  lung]
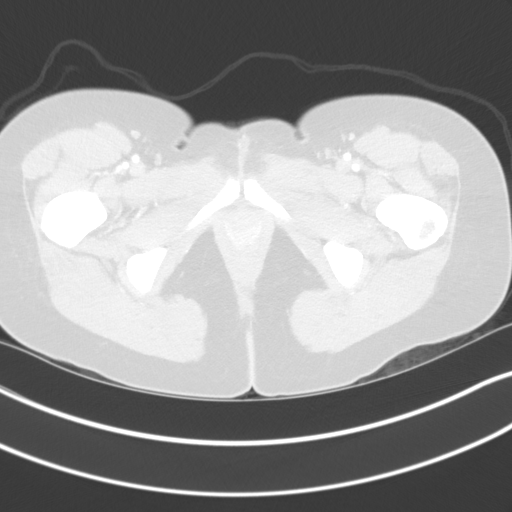
[im 21/125  lung]
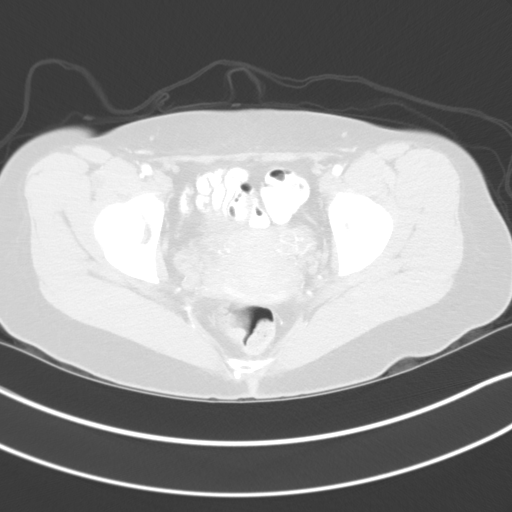
[im 32/125  lung]
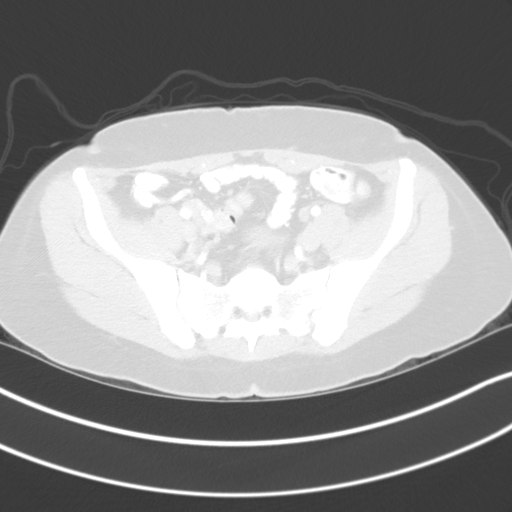
[im 42/125  lung]
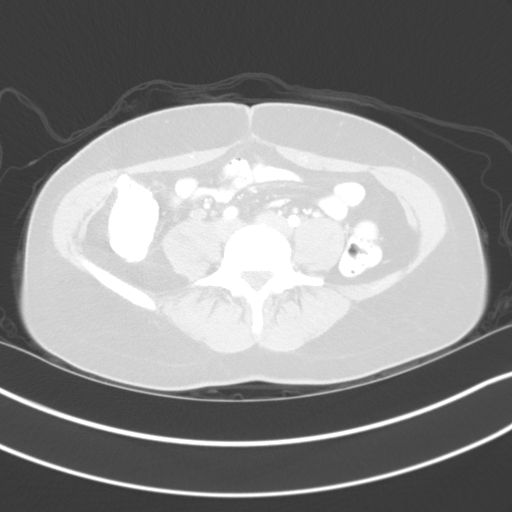
[im 52/125  mediastinal]
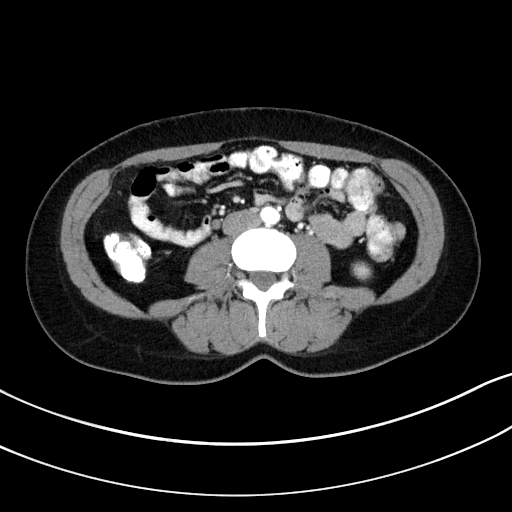
[im 52/125  lung]
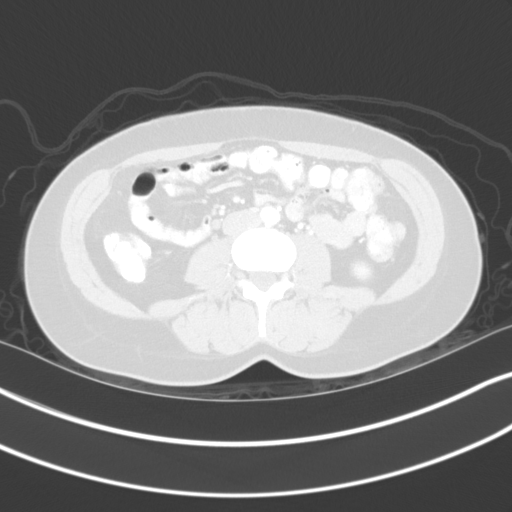
[im 73/125  lung]
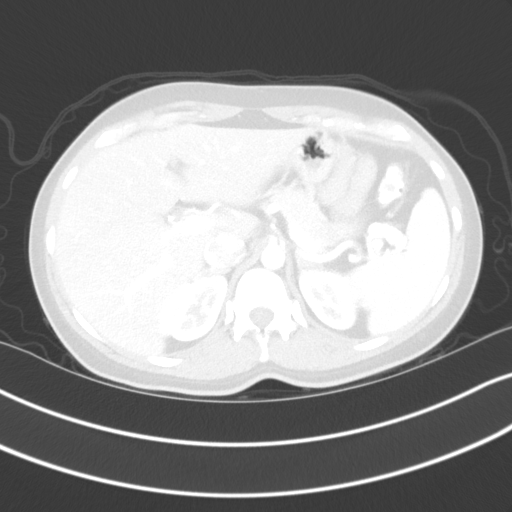
[im 83/125  lung]
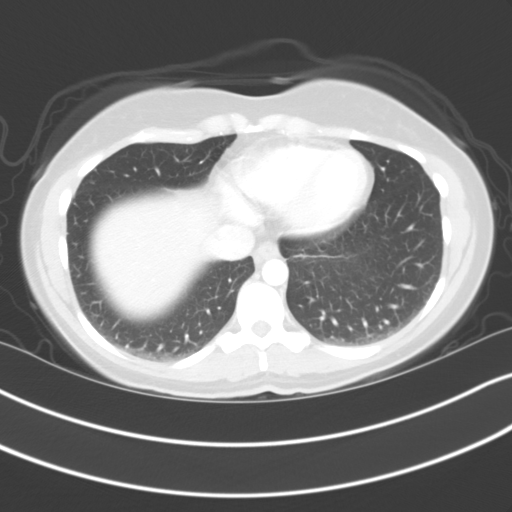
[im 94/125  lung]
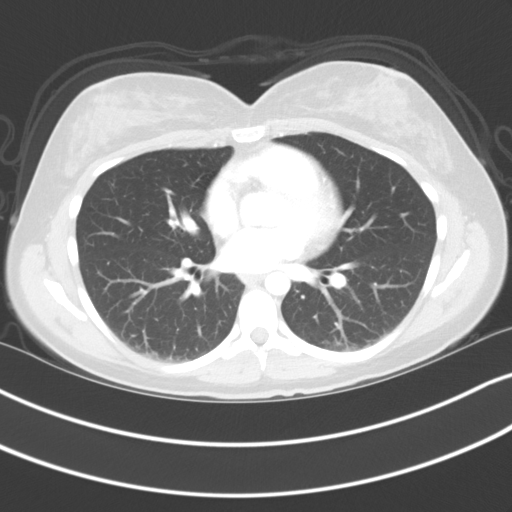
[im 104/125  mediastinal]
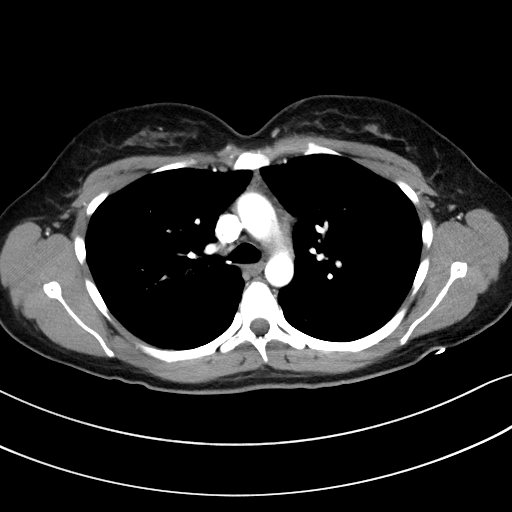
[im 104/125  lung]
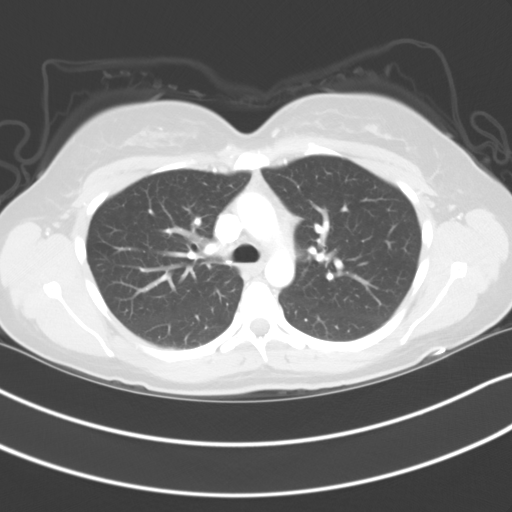
[im 114/125  lung]
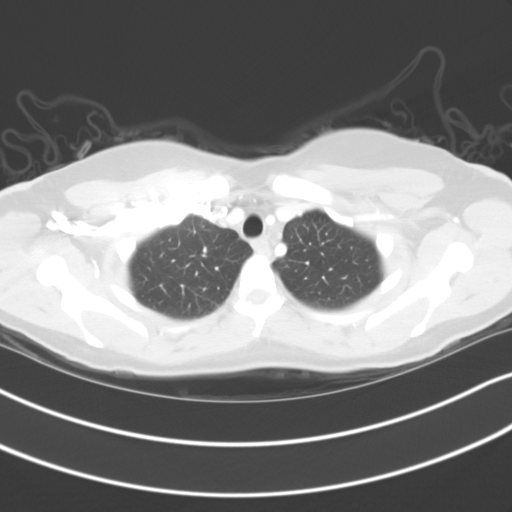

[Series 5: coronals · coronal · 0.75mm/px · 3 of 137 slices shown]
[im 28/137  lung]
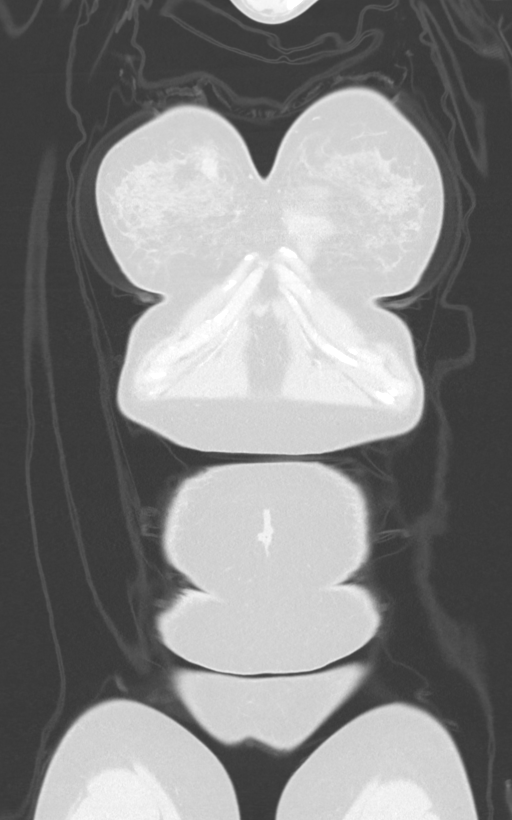
[im 55/137  lung]
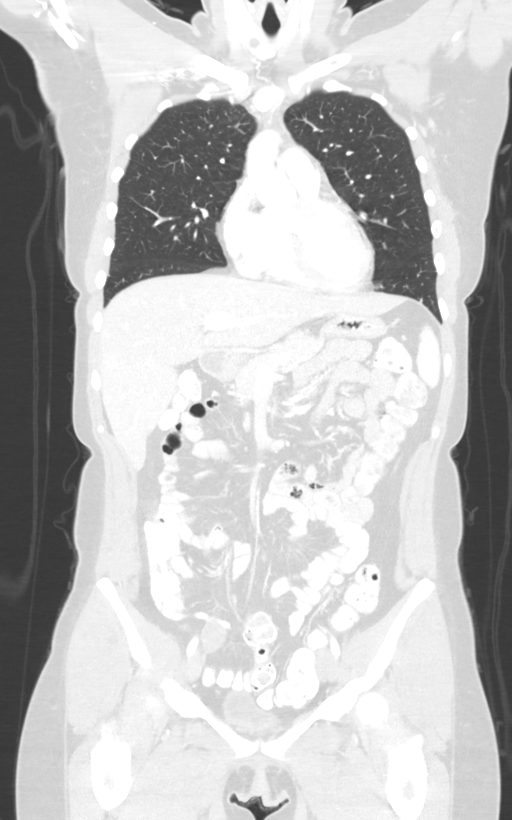
[im 82/137  lung]
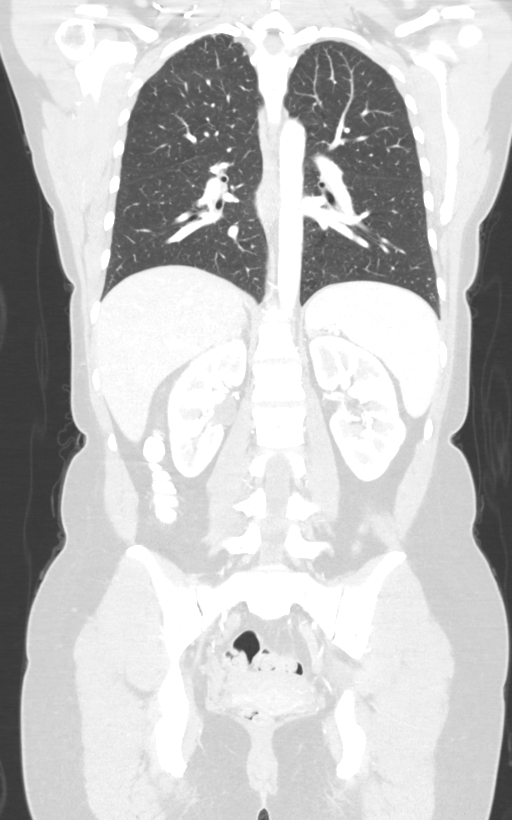

[13 of 36 positions shown; findings below may reference images not displayed]

FINDINGS: CT CHEST FINDINGS

Cardiovascular: Heart size normal. No pericardial effusion. No
thoracic aortic aneurysm.

Mediastinum/Nodes: No mediastinal lymphadenopathy. There is no hilar
lymphadenopathy. The esophagus has normal imaging features. There is
no axillary lymphadenopathy.

Lungs/Pleura: No suspicious pulmonary nodule or mass. No focal
airspace consolidation. No pulmonary edema or pleural effusion.

Musculoskeletal: Bone windows reveal no worrisome lytic or sclerotic
osseous lesions. Medial right breast mass noted with central
localization clip. 8 mm hypoattenuating lesion identified right
thyroid lobe.

CT ABDOMEN PELVIS FINDINGS

Hepatobiliary: Tiny hypodensities scattered in the liver parenchyma
measure up to maximum size of about 4 mm, too small to characterize.
Gallbladder surgically absent. No intrahepatic or extrahepatic
biliary dilation.

Pancreas: No focal mass lesion. No dilatation of the main duct. No
intraparenchymal cyst. No peripancreatic edema.

Spleen: No splenomegaly. No focal mass lesion.

Adrenals/Urinary Tract: No adrenal nodule or mass. Kidneys
unremarkable. No evidence for hydroureter. The urinary bladder
appears normal for the degree of distention.

Stomach/Bowel: Stomach is nondistended. No gastric wall thickening.
No evidence of outlet obstruction. Duodenum is normally positioned
as is the ligament of Treitz. No small bowel wall thickening. No
small bowel dilatation. The terminal ileum is normal. The appendix
is normal. No gross colonic mass. No colonic wall thickening. No
substantial diverticular change.

Vascular/Lymphatic: No abdominal aortic aneurysm. There is no
gastrohepatic or hepatoduodenal ligament lymphadenopathy. No
intraperitoneal or retroperitoneal lymphadenopathy. No pelvic
sidewall lymphadenopathy.

Reproductive: The uterus has normal CT imaging appearance. There is
no adnexal mass.

Other: No intraperitoneal free fluid.

Musculoskeletal: Bone windows reveal no worrisome lytic or sclerotic
osseous lesions.
IMPRESSION: 1. No definite CT evidence for metastatic disease in the chest,
abdomen, or pelvis.
2. Soft tissue lesion medial right breast, compatible with patient's
known primary malignancy.
3. Tiny hypodensities in the liver parenchyma, too small to
characterize. While likely benign, attention on follow-up is
recommended.

## 2019-04-06 IMAGING — NM NM BONE WHOLE BODY
2 series · 2 of 2 positions shown · non-contrast
Comparison: CT cap 02/19/2018

CLINICAL DATA: Breast cancer. Lower extremity pain. Evaluate for
metastatic disease.

EXAM:
NUCLEAR MEDICINE WHOLE BODY BONE SCAN
TECHNIQUE: Whole body anterior and posterior images were obtained approximately
3 hours after intravenous injection of radiopharmaceutical.
RADIOPHARMACEUTICALS:  21.6 mCi Kechnetium-DDm MDP IV

[Series 1: wbr_bone_40 whole body · 2.66mm/px · 1 of 1 slices shown (1 of 2)]
[im 1/1]
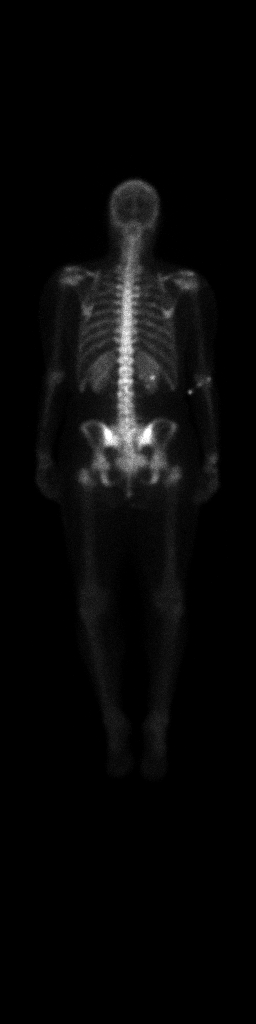

[Series 1: wbr_bone_40 whole body · 2.66mm/px · 1 of 1 slices shown (2 of 2)]
[im 1/1]
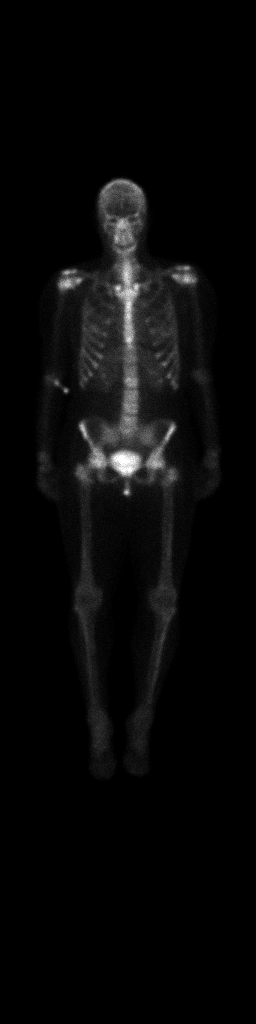

[2 of 2 positions shown; findings below may reference images not displayed]

FINDINGS: No abnormal foci of increased or decreased radiotracer uptake
identified to suggest osseous metastatic disease. Normal physiologic
tracer activity identified within the kidneys an urinary bladder.
IMPRESSION: 1. Negative exam.  No evidence for metastatic disease.  The

## 2019-05-12 ENCOUNTER — Ambulatory Visit (HOSPITAL_BASED_OUTPATIENT_CLINIC_OR_DEPARTMENT_OTHER): Admit: 2019-05-12 | Payer: BLUE CROSS/BLUE SHIELD | Admitting: Plastic Surgery

## 2019-05-12 ENCOUNTER — Encounter (HOSPITAL_BASED_OUTPATIENT_CLINIC_OR_DEPARTMENT_OTHER): Payer: Self-pay

## 2019-05-12 SURGERY — REPLACEMENT, IMPLANT, BREAST
Anesthesia: General | Site: Breast | Laterality: Left

## 2019-05-14 DIAGNOSIS — Z853 Personal history of malignant neoplasm of breast: Secondary | ICD-10-CM | POA: Diagnosis not present

## 2019-05-14 DIAGNOSIS — Z9013 Acquired absence of bilateral breasts and nipples: Secondary | ICD-10-CM | POA: Diagnosis not present

## 2019-05-14 DIAGNOSIS — Z1501 Genetic susceptibility to malignant neoplasm of breast: Secondary | ICD-10-CM | POA: Diagnosis not present

## 2019-05-14 DIAGNOSIS — Z1509 Genetic susceptibility to other malignant neoplasm: Secondary | ICD-10-CM | POA: Diagnosis not present

## 2019-05-14 NOTE — H&P (Signed)
Subjective:     Patient ID: [name] is a 33 y.o. female.  HPI  6 months post op implant exchange. Plan revision left breast reconstruction next month, this has been delayed secondary to COVID. States when implant in correct position volume in bra symmetric. Happy with right side. Notes no rippling.   Presented with palpable right breast mass. MMG/US demonstrated 1.5 cm UIQ right breast mass, with additional enhancement it measured 2.2 cm. No axillary lymph nodes. Biopsy revealed IDC with DCIS, triple negative. MRI demonstrated known malignancy in the upper inner right breast measures 1.8 x 2.2 X 2.2 cm.  Staging scans negative. Completed neoadjuvant chemotherapy 7.15.19. Final MRI with mass measuring 6 x 7 x 7 mm. Bone scar 01/2019 for arthralgias negative.  Final pathology right breast with no residual carcinoma, 0/6 SLN.  Genetics with BRCA1 mutation. Planning on having risk reducing salpingo oophorectomies in May, 2020   Prior 36 B, goal C cup. Right mastectomy 382 g Left 360 g She works as an Chief Technology Officer. 3 daughters including twins.  11.26.19 intraop findings "Full incorporation ADM over right chest, partial incorporation over left chest with absent ADM over upper pole."  Review of Systems     Objective:   Physical Exam  Cardiovascular: Normal rate, regular rhythm and normal heart sounds.  Pulmonary/Chest: Effort normal and breath sounds normal.  Lymphadenopathy:    She has no axillary adenopathy.   Chest soft bilateral ;left inferior and lateral implant malposition with resultant depression superior pole Right no lateral or inferior displacement in supine position No visible rippling no animation SN to nipple R 22 L 24 cm Nipple to IMF R 8 L 10 cm  Assessment:     Right breast cancer UIQ ER- (triple negative) Neoadjuvant chemotherapy BRCA1 S/p bilateral NSM, prepectoral TE/ADM (Alloderm) reconstruction S/p silicone implant  exchange, removal port    Plan:     Plan left breast reconstruction revision with silicone implant exchange, acellular dermis to left chest.  We continue to agree no visible rippling and no plan for fat grafting. We agree in corrected position implant volume symmetric and will plan to place new device left at time revision but same volume and projection. Recommend additional ADM reinforcement on left to support implant. Reviewed it is possible she has similar problems with resorption. Discussed alternative including porcine products that may stretch less, synthetic mesh. Plan OP surgery, will have drain on left.  Discussed risk COVID infectionthrough this elective surgery. Patient will receive COVID testing prior to surgery. Discussed even if patient receivesa negative test result, the tests in some cases may fail to detect the virus or patient maycontract COVID after the test.COVID 19 infectionbefore/during/aftersurgery may result in lead to a higher chance of complication and death.  Rx for Bactrim, Robaxin, oxycodone given.  Natrelle Inspira Extra Projection Smooth Roundimplants placed bilateral. RIGHT 470 ml REF SRX-470 LEFT 445 ml REF [id number]      Irene Limbo, MD [facility] [phone], pin [phone]

## 2019-05-15 ENCOUNTER — Other Ambulatory Visit: Payer: Self-pay

## 2019-05-15 ENCOUNTER — Ambulatory Visit (HOSPITAL_COMMUNITY): Payer: Self-pay

## 2019-05-15 ENCOUNTER — Inpatient Hospital Stay: Payer: BLUE CROSS/BLUE SHIELD | Attending: Hematology and Oncology

## 2019-05-15 ENCOUNTER — Ambulatory Visit (HOSPITAL_COMMUNITY)
Admission: RE | Admit: 2019-05-15 | Discharge: 2019-05-15 | Disposition: A | Discharge status: Home / Self Care | Payer: BLUE CROSS/BLUE SHIELD | Source: Ambulatory Visit | Attending: Gynecologic Oncology | Admitting: Gynecologic Oncology

## 2019-05-15 DIAGNOSIS — Z1509 Genetic susceptibility to other malignant neoplasm: Secondary | ICD-10-CM | POA: Diagnosis not present

## 2019-05-15 DIAGNOSIS — C50211 Malignant neoplasm of upper-inner quadrant of right female breast: Secondary | ICD-10-CM | POA: Insufficient documentation

## 2019-05-15 DIAGNOSIS — Z1501 Genetic susceptibility to malignant neoplasm of breast: Secondary | ICD-10-CM

## 2019-05-16 ENCOUNTER — Telehealth: Payer: Self-pay

## 2019-05-16 LAB — CA 125: Cancer Antigen (CA) 125: 20.6 U/mL (ref 0.0–38.1)

## 2019-05-16 NOTE — Telephone Encounter (Signed)
LM for [name] to call the office back to discuss the results.

## 2019-05-16 NOTE — Telephone Encounter (Signed)
Told [name] that the CA-125 was stable at 20.6. The [place] was normal. It showed a normal simple cyst from ovulation. She is ready to have surgery, however, she is having the left breast reconstruction redone on 05-30-19.  Dr. Denman Reyes said that she needs to wait 6 weeks between surgeries. Will give this information to Jamie John, NP to schedule surgery.  The office will be in touch with her with surgery date information.

## 2019-05-27 ENCOUNTER — Other Ambulatory Visit (HOSPITAL_COMMUNITY): Payer: BLUE CROSS/BLUE SHIELD

## 2019-05-30 ENCOUNTER — Ambulatory Visit (HOSPITAL_BASED_OUTPATIENT_CLINIC_OR_DEPARTMENT_OTHER): Admit: 2019-05-30 | Payer: BLUE CROSS/BLUE SHIELD | Admitting: Plastic Surgery

## 2019-05-30 ENCOUNTER — Encounter (HOSPITAL_BASED_OUTPATIENT_CLINIC_OR_DEPARTMENT_OTHER): Payer: Self-pay

## 2019-05-30 SURGERY — REPLACEMENT, IMPLANT, BREAST
Anesthesia: General | Site: Breast | Laterality: Left

## 2019-07-07 NOTE — Assessment & Plan Note (Signed)
02/01/2018:Screening detected right breast mass in the upper inner quadrant measuring 1.5 cm, with additional enhancement it measured 2.2 cm. No axillary lymph nodes. 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 Genetic counseling and testing: Negative for mutations 2. neoadjuvant chemotherapy 02/20/2018-07/05/2018 with dose dense Adriamycin and Cytoxan followed by Taxol weekly x12 (changed to Abraxane due to rash) accompanied by carboplatin every 3 weeks x4 3.07/29/2018:Bilateral mastectomies: Right mastectomy benign, 0/6 lymph nodes; left mastectomy: Benign, pathologic complete response 4.  Breast reconstruction with silicone implants by Dr. Iran Planas 11/19/2018 CT CAP and bone scan: Negative for metastases --------------------------------------------------------------------------------------------------------------------------------- Breast cancer surveillance: No role of imaging because she had bilateral mastectomies. Patient does not have any signs or symptoms of disease recurrence. Return to clinic in 1 year for evaluation.  Instructed her to call us if she develops any symptoms or wants to see [place] for a follow-up.

## 2019-07-08 ENCOUNTER — Telehealth: Payer: Self-pay | Admitting: *Deleted

## 2019-07-08 NOTE — Telephone Encounter (Signed)
Patient called and stated "I know that my surgery is scheduled for around July 30 th. I haven't heard anything yet and wanted to make sure it was still a go."  Explained that she will get a call the week before to schedule her per admission appt and COVID test. Explained that after her COVID test she is to go straight home and quarantine until the day of her surgery. Also explained that either she can call the office to schedule her per-op appt with Melissa APP before she goes to per admission; if we don't call her first. Patient verbalized undestanding

## 2019-07-09 ENCOUNTER — Telehealth: Payer: Self-pay | Admitting: Hematology and Oncology

## 2019-07-09 NOTE — Telephone Encounter (Signed)
I left a message regarding video visit  °

## 2019-07-11 ENCOUNTER — Telehealth: Payer: Self-pay | Admitting: Hematology and Oncology

## 2019-07-11 NOTE — Progress Notes (Signed)
I was unable to connect with the patient for her video visit. I left a message for her to call us back to reschedule her appointment. This encounter was created in error - please disregard.

## 2019-07-11 NOTE — Telephone Encounter (Signed)
Left VM to confirm appt/verify info °

## 2019-07-14 ENCOUNTER — Inpatient Hospital Stay: Payer: BC Managed Care – PPO | Attending: Hematology and Oncology | Admitting: Hematology and Oncology

## 2019-07-14 DIAGNOSIS — Z171 Estrogen receptor negative status [ER-]: Secondary | ICD-10-CM

## 2019-07-14 DIAGNOSIS — C50211 Malignant neoplasm of upper-inner quadrant of right female breast: Secondary | ICD-10-CM

## 2019-07-15 ENCOUNTER — Telehealth: Payer: Self-pay | Admitting: *Deleted

## 2019-07-15 NOTE — Telephone Encounter (Signed)
Called and left the patient a message to call the office back. Need to schedule an appt with Melissa APP

## 2019-07-16 ENCOUNTER — Telehealth: Payer: Self-pay | Admitting: *Deleted

## 2019-07-16 NOTE — Telephone Encounter (Signed)
Called and spoke with the patient, scheduled her pre op appt with Melissa APP. Also gave her the other appts for this week and next week.

## 2019-07-18 ENCOUNTER — Encounter: Payer: Self-pay | Admitting: Gynecologic Oncology

## 2019-07-18 ENCOUNTER — Inpatient Hospital Stay (HOSPITAL_BASED_OUTPATIENT_CLINIC_OR_DEPARTMENT_OTHER): Payer: BC Managed Care – PPO | Admitting: Gynecologic Oncology

## 2019-07-18 ENCOUNTER — Other Ambulatory Visit (HOSPITAL_COMMUNITY)
Admission: RE | Admit: 2019-07-18 | Discharge: 2019-07-18 | Disposition: A | Discharge status: Home / Self Care | Payer: BC Managed Care – PPO | Source: Ambulatory Visit | Attending: Gynecologic Oncology | Admitting: Gynecologic Oncology

## 2019-07-18 ENCOUNTER — Other Ambulatory Visit: Payer: Self-pay

## 2019-07-18 VITALS — BP 94/54 | HR 76 | Temp 99.3°F | Resp 18 | Ht 60.0 in | Wt 132.4 lb

## 2019-07-18 DIAGNOSIS — Z1501 Genetic susceptibility to malignant neoplasm of breast: Secondary | ICD-10-CM

## 2019-07-18 DIAGNOSIS — Z01818 Encounter for other preprocedural examination: Secondary | ICD-10-CM

## 2019-07-18 DIAGNOSIS — Z1509 Genetic susceptibility to other malignant neoplasm: Secondary | ICD-10-CM

## 2019-07-18 DIAGNOSIS — Z1159 Encounter for screening for other viral diseases: Secondary | ICD-10-CM | POA: Diagnosis not present

## 2019-07-18 MED ORDER — SENNOSIDES-DOCUSATE SODIUM 8.6-50 MG PO TABS: 2.0000 | ORAL_TABLET | Freq: Every day | ORAL | 1 refills | Status: DC

## 2019-07-18 MED ORDER — OXYCODONE HCL 5 MG PO TABS: 5.0000 mg | ORAL_TABLET | ORAL | 0 refills | Status: DC | PRN

## 2019-07-18 MED ORDER — IBUPROFEN 800 MG PO TABS: 800.0000 mg | ORAL_TABLET | Freq: Three times a day (TID) | ORAL | 0 refills | Status: DC | PRN

## 2019-07-18 NOTE — Progress Notes (Signed)
ECHO 02-18-18 EPIC

## 2019-07-18 NOTE — Patient Instructions (Signed)
Preparing for your Surgery  Plan for surgery on July 22, 2019 with Dr. Everitt Amber at [facility] will be scheduled for a robotic assisted total hysterectomy, bilateral salpingo-oophorectomy.   Pre-operative Testing -You will receive a phone call from presurgical testing at [facility] if you have not received a call already to arrange for a pre-operative testing appointment before your surgery.  This appointment normally occurs one to two weeks before your scheduled surgery.   -Bring your insurance card, copy of an advanced directive if applicable, medication list  -At that visit, you will be asked to sign a consent for a possible blood transfusion in case a transfusion becomes necessary during surgery.  The need for a blood transfusion is rare but having consent is a necessary part of your care.     -You should not be taking blood thinners or aspirin at least ten days prior to surgery unless instructed by your surgeon.  -Do not take supplements such as fish oil (omega 3), red yeast rice, tumeric before your surgery.  Day Before Surgery at [facility] will be asked to take in a light diet the day before surgery.  Avoid carbonated beverages.  You will be advised to have nothing to eat or drink after midnight the evening before.    Eat a light diet the day before surgery.  Examples including soups, broths, toast, yogurt, mashed potatoes.  Things to avoid include carbonated beverages (fizzy beverages), raw fruits and raw vegetables, or beans.   If your bowels are filled with gas, your surgeon will have difficulty visualizing your pelvic organs which increases your surgical risks.  Your role in recovery Your role is to become active as soon as directed by your doctor, while still giving yourself time to heal.  Rest when you feel tired. You will be asked to do the following in order to speed your recovery:  - Cough and breathe deeply. This helps toclear and expand your lungs  and can prevent pneumonia. You may be given a spirometer to practice deep breathing. A staff member will show you how to use the spirometer. - Do mild physical activity. Walking or moving your legs help your circulation and body functions return to normal. A staff member will help you when you try to walk and will provide you with simple exercises. Do not try to get up or walk alone the first time. - Actively manage your pain. Managing your pain lets you move in comfort. We will ask you to rate your pain on a scale of zero to 10. It is your responsibility to tell your doctor or nurse where and how much you hurt so your pain can be treated.  Special Considerations -If you are diabetic, you may be placed on insulin after surgery to have closer control over your blood sugars to promote healing and recovery.  This does not mean that you will be discharged on insulin.  If applicable, your oral antidiabetics will be resumed when you are tolerating a solid diet.  -Your final pathology results from surgery should be available around one week after surgery and the results will be relayed to you when available.  -Dr. Lahoma Crocker is the surgeon that assists your GYN Oncologist with surgery.  If you end up staying the night, the next day after your surgery you will either see Dr. Denman George or Dr. Lahoma Crocker.  -FMLA forms can be faxed to [phone] and please allow 5-7 business days for completion.  Pain Management After Surgery -You have been prescribed your pain medication and bowel regimen medications before surgery so that you can have these available when you are discharged from the hospital. The pain medication is for use ONLY AFTER surgery and a new prescription will not be given.   -Make sure that you have Tylenol and Ibuprofen at home to use on a regular basis after surgery for pain control. We recommend alternating the medications every hour to six hours since they work differently and are  processed in the body differently for pain relief.  -Review the attached handout on narcotic use and their risks and side effects.   Bowel Regimen -You have been prescribed Sennakot-S to take nightly to prevent constipation especially if you are taking the narcotic pain medication intermittently.  It is important to prevent constipation and drink adequate amounts of liquids.  Blood Transfusion Information WHAT IS A BLOOD TRANSFUSION? A transfusion is the replacement of blood or some of its parts. Blood is made up of multiple cells which provide different functions.  Red blood cells carry oxygen and are used for blood loss replacement.  White blood cells fight against infection.  Platelets control bleeding.  Plasma helps clot blood.  Other blood products are available for specialized needs, such as hemophilia or other clotting disorders. BEFORE THE TRANSFUSION  Who gives blood for transfusions?   You may be able to donate blood to be used at a later date on yourself (autologous donation).  Relatives can be asked to donate blood. This is generally not any safer than if you have received blood from a stranger. The same precautions are taken to ensure safety when a relative's blood is donated.  Healthy volunteers who are fully evaluated to make sure their blood is safe. This is blood bank blood. Transfusion therapy is the safest it has ever been in the practice of medicine. Before blood is taken from a donor, a complete history is taken to make sure that person has no history of diseases nor engages in risky social behavior (examples are intravenous drug use or sexual activity with multiple partners). The donor's travel history is screened to minimize risk of transmitting infections, such as malaria. The donated blood is tested for signs of infectious diseases, such as HIV and hepatitis. The blood is then tested to be sure it is compatible with you in order to minimize the chance of a  transfusion reaction. If you or a relative donates blood, this is often done in anticipation of surgery and is not appropriate for emergency situations. It takes many days to process the donated blood. RISKS AND COMPLICATIONS Although transfusion therapy is very safe and saves many lives, the main dangers of transfusion include:   Getting an infectious disease.  Developing a transfusion reaction. This is an allergic reaction to something in the blood you were given. Every precaution is taken to prevent this. The decision to have a blood transfusion has been considered carefully by your caregiver before blood is given. Blood is not given unless the benefits outweigh the risks.  AFTER SURGERY CONSIDERATIONS  07/18/2019  Return to work: 4-6 weeks if applicable  Activity: 1. Be up and out of the bed during the day.  Take a nap if needed.  You may walk up steps but be careful and use the hand rail.  Stair climbing will tire you more than you think, you may need to stop part way and rest.   2. No lifting or straining for  6 weeks.  3. No driving for 1 week(s).  Do not drive if you are taking narcotic pain medicine.  4. Shower daily.  Use soap and water on your incision and pat dry; don't rub.  No tub baths until cleared by your surgeon.   5. No sexual activity and nothing in the vagina for 8 weeks.  6. You may experience a small amount of clear drainage from your incisions, which is normal.  If the drainage persists or increases, please call the office.  7. You may experience vaginal spotting after surgery or around the 6-8 week mark from surgery when the stitches at the top of the vagina begin to dissolve.  The spotting is normal but if you experience heavy bleeding, call our office.  8. Take Tylenol or ibuprofen first for pain and only use NARCOTIC PAIN MED for severe pain not relieved by the Tylenol or Ibuprofen.  Monitor your Tylenol intake to a max of 4,000 mg a day.  Diet: 1. Low  sodium Heart Healthy Diet is recommended.  2. It is safe to use a laxative, such as Miralax or Colace, if you have difficulty moving your bowels. You can take Sennakot at bedtime every evening to keep bowel movements regular and to prevent constipation.    Wound Care: 1. Keep clean and dry.  Shower daily.  Reasons to call the Doctor:  Fever - Oral temperature greater than 100.4 degrees Fahrenheit  Foul-smelling vaginal discharge  Difficulty urinating  Nausea and vomiting  Increased pain at the site of the incision that is unrelieved with pain medicine.  Difficulty breathing with or without chest pain  New calf pain especially if only on one side  Sudden, continuing increased vaginal bleeding with or without clots.   Contacts: For questions or concerns you should contact:  Dr. Everitt Amber at [phone]  [name], NP at [phone]  After Hours: call [phone] and have the GYN Oncologist paged/contacted

## 2019-07-18 NOTE — Progress Notes (Addendum)
Patient here alone for a pre-operative appointment prior to her scheduled surgery on July 22, 2019. She is scheduled for a robotic assisted total hysterectomy, bilateral salpingo-oophorectomy.  She has her pre-admission testing appointment on Monday at [facility].  The surgery was discussed in detail.  See after visit summary for additional details.   The patient reports having an umbilical hernia repair that she would like to have repaired if possible at this time of surgery.  Discussed with Dr. Denman George who stated she would attempt to repair the umbilical hernia but there is a possibility that the repair may fail and the hernia could reoccur. Patient verbalizing understanding. She is also concerned about losing her ovaries and the increased risk to her cardiac health and overall health.  Per Dr. Denman George, any consideration of hormone replacement will be discussed with Dr. Lindi Adie, her medical oncologist.   Discussed post-op pain management in detail including the aspects of the enhanced recovery pathway.  Advised her that a new prescription would be sent in for oxycodone and it is only to be used for after her upcoming surgery.  We discussed the use of tylenol post-op and to monitor for a maximum of 4,000 mg in a 24 hour period.  Also prescribed sennakot to be used after surgery and to hold if having loose stools.  Discussed bowel regimen in detail.     Discussed the use of SCDs, and measures to take at home to prevent DVT including frequent mobility.  Reportable signs and symptoms of DVT discussed. Post-operative instructions discussed and expectations for after surgery. Incisional care discussed.     15 minutes spent with the patient.  Verbalizing understanding of material discussed. No needs or concerns voiced at the end of the visit. Advised patient and family to call for any needs. Advised that her post-operative medications had been prescribed and could be picked up at any time.

## 2019-07-18 NOTE — Patient Instructions (Addendum)
YOU HAD  COVID 19 TEST ON 07-18-2019. ONCE YOUR COVID TEST IS COMPLETED, PLEASE BEGIN THE QUARANTINE INSTRUCTIONS AS OUTLINED IN YOUR HANDOUT.                [name]    Your procedure is scheduled on: 07-22-2019   Report to [facility] Main  Entrance    Report to  [facility] at 530  AM    1 VISITOR IS ALLOWED TO WAIT IN WAITING ROOM  ONLY DAY OF YOUR SURGERY.    Call this number if you have problems the morning of surgery [phone]    Eat a light diet the day before surgery ON Monday 07-21-2019. Examples including soups, broths, toast, yogurt, mashed potatoes.  Things to avoid include carbonated beverages (fizzy beverages), raw fruits and raw vegetables, or beans.   If your bowels are filled with gas, your surgeon will have difficulty visualizing your pelvic organs which increases your surgical risks.       Remember: Do not eat food  :After Midnight.    CLEAR LIQUIDS FROM MIDNIGHT UNTIL 430 AM. NOTHING BY MOUTH AFTER 430 AM.   CLEAR LIQUID DIET   Foods Allowed                                                                     Foods Excluded  Coffee and tea, regular and decaf                             liquids that you cannot  Plain Jell-O any favor except red or purple                                           see through such as: Fruit ices (not with fruit pulp)                                     milk, soups, orange juice  Iced Popsicles                                    All solid food                               Cranberry, grape and apple juices Sports drinks like Gatorade Lightly seasoned clear broth or consume(fat free) Sugar, honey syrup  Sample Menu Breakfast                                Lunch                                     Supper Cranberry juice  Beef broth                            Chicken broth Jell-O                                     Grape juice                           Apple juice Coffee or tea                         Jell-O                                      Popsicle                                                Coffee or tea                        Coffee or tea  _____________________________________________________________________    Take these medicines the morning of surgery with A SIP OF WATER: None   BRUSH YOUR TEETH MORNING OF SURGERY AND RINSE YOUR MOUTH OUT, NO CHEWING GUM CANDY OR MINTS.                                 You may not have any metal on your body including hair pins and              piercings     Do not wear jewelry, make-up, lotions, powders or perfumes, deodorant              Do not wear nail polish.  Do not shave  48 hours prior to surgery.                  Do not bring valuables to the hospital. [name].  Contacts, dentures or bridgework may not be worn into surgery.     Patients discharged the day of surgery will not be allowed to drive home. IF YOU ARE HAVING SURGERY AND GOING HOME THE SAME DAY, YOU MUST HAVE AN ADULT TO DRIVE YOU HOME AND BE WITH YOU FOR 24 HOURS. YOU MAY GO HOME BY TAXI OR UBER OR ORTHERWISE, BUT AN ADULT MUST ACCOMPANY YOU HOME AND STAY WITH YOU FOR 24 HOURS.    Name and phone number of your driver: [name] [phone]  Special Instructions: N/A              Please read over the following fact sheets you were given: _____________________________________________________________________             [facility] - Preparing for Surgery Before surgery, you can play an important role.  Because skin is not sterile, your skin needs to be as free of germs as possible.  You can reduce the number of germs on your skin by washing with  CHG (chlorahexidine gluconate) soap before surgery.  CHG is an antiseptic cleaner which kills germs and bonds with the skin to continue killing germs even after washing. Please DO NOT use if you have an allergy to CHG or antibacterial soaps.  If your  skin becomes reddened/irritated stop using the CHG and inform your nurse when you arrive at Short Stay. Do not shave (including legs and underarms) for at least 48 hours prior to the first CHG shower.  You may shave your face/neck. Please follow these instructions carefully:  1.  Shower with CHG Soap the night before surgery and the  morning of Surgery.  2.  If you choose to wash your hair, wash your hair first as usual with your  normal  shampoo.  3.  After you shampoo, rinse your hair and body thoroughly to remove the  shampoo.                           4.  Use CHG as you would any other liquid soap.  You can apply chg directly  to the skin and wash                       Gently with a scrungie or clean washcloth.  5.  Apply the CHG Soap to your body ONLY FROM THE NECK DOWN.   Do not use on face/ open                           Wound or open sores. Avoid contact with eyes, ears mouth and genitals (private parts).                       Wash face,  Genitals (private parts) with your normal soap.             6.  Wash thoroughly, paying special attention to the area where your surgery  will be performed.  7.  Thoroughly rinse your body with warm water from the neck down.  8.  DO NOT shower/wash with your normal soap after using and rinsing off  the CHG Soap.                9.  Pat yourself dry with a clean towel.            10.  Wear clean pajamas.            11.  Place clean sheets on your bed the night of your first shower and do not  sleep with pets. Day of Surgery : Do not apply any lotions/deodorants the morning of surgery.  Please wear clean clothes to the hospital/surgery center.  FAILURE TO FOLLOW THESE INSTRUCTIONS MAY RESULT IN THE CANCELLATION OF YOUR SURGERY PATIENT SIGNATURE_________________________________  NURSE SIGNATURE__________________________________  ________________________________________________________________________   [name]  An incentive spirometer  is a tool that can help keep your lungs clear and active. This tool measures how well you are filling your lungs with each breath. Taking long deep breaths may help reverse or decrease the chance of developing breathing (pulmonary) problems (especially infection) following:  A long period of time when you are unable to move or be active. BEFORE THE PROCEDURE   If the spirometer includes an indicator to show your best effort, your nurse or respiratory therapist will set it to a desired goal.  If possible, sit up straight or  lean slightly forward. Try not to slouch.  Hold the incentive spirometer in an upright position. INSTRUCTIONS FOR USE  1. Sit on the edge of your bed if possible, or sit up as far as you can in bed or on a chair. 2. Hold the incentive spirometer in an upright position. 3. Breathe out normally. 4. Place the mouthpiece in your mouth and seal your lips tightly around it. 5. Breathe in slowly and as deeply as possible, raising the piston or the ball toward the top of the column. 6. Hold your breath for 3-5 seconds or for as long as possible. Allow the piston or ball to fall to the bottom of the column. 7. Remove the mouthpiece from your mouth and breathe out normally. 8. Rest for a few seconds and repeat Steps 1 through 7 at least 10 times every 1-2 hours when you are awake. Take your time and take a few normal breaths between deep breaths. 9. The spirometer may include an indicator to show your best effort. Use the indicator as a goal to work toward during each repetition. 10. After each set of 10 deep breaths, practice coughing to be sure your lungs are clear. If you have an incision (the cut made at the time of surgery), support your incision when coughing by placing a pillow or rolled up towels firmly against it. Once you are able to get out of bed, walk around indoors and cough well. You may stop using the incentive spirometer when instructed by your caregiver.  RISKS AND  COMPLICATIONS  Take your time so you do not get dizzy or light-headed.  If you are in pain, you may need to take or ask for pain medication before doing incentive spirometry. It is harder to take a deep breath if you are having pain. AFTER USE  Rest and breathe slowly and easily.  It can be helpful to keep track of a log of your progress. Your caregiver can provide you with a simple table to help with this. If you are using the spirometer at home, follow these instructions: [place] IF:   You are having difficultly using the spirometer.  You have trouble using the spirometer as often as instructed.  Your pain medication is not giving enough relief while using the spirometer.  You develop fever of 100.5 F (38.1 C) or higher. SEEK IMMEDIATE MEDICAL CARE IF:   You cough up bloody sputum that had not been present before.  You develop fever of 102 F (38.9 C) or greater.  You develop worsening pain at or near the incision site. MAKE SURE YOU:   Understand these instructions.  Will watch your condition.  Will get help right away if you are not doing well or get worse. Document Released: 04/23/2007 Document Revised: 03/04/2012 Document Reviewed: 06/24/2007 [employer] Patient Information 2014 [employer], [place].   ________________________________________________________________________  WHAT IS A BLOOD TRANSFUSION? Blood Transfusion Information  A transfusion is the replacement of blood or some of its parts. Blood is made up of multiple cells which provide different functions.  Red blood cells carry oxygen and are used for blood loss replacement.  White blood cells fight against infection.  Platelets control bleeding.  Plasma helps clot blood.  Other blood products are available for specialized needs, such as hemophilia or other clotting disorders. BEFORE THE TRANSFUSION  Who gives blood for transfusions?   Healthy volunteers who are fully evaluated to make sure  their blood is safe. This is blood bank blood.  Transfusion therapy is the safest it has ever been in the practice of medicine. Before blood is taken from a donor, a complete history is taken to make sure that person has no history of diseases nor engages in risky social behavior (examples are intravenous drug use or sexual activity with multiple partners). The donor's travel history is screened to minimize risk of transmitting infections, such as malaria. The donated blood is tested for signs of infectious diseases, such as HIV and hepatitis. The blood is then tested to be sure it is compatible with you in order to minimize the chance of a transfusion reaction. If you or a relative donates blood, this is often done in anticipation of surgery and is not appropriate for emergency situations. It takes many days to process the donated blood. RISKS AND COMPLICATIONS Although transfusion therapy is very safe and saves many lives, the main dangers of transfusion include:   Getting an infectious disease.  Developing a transfusion reaction. This is an allergic reaction to something in the blood you were given. Every precaution is taken to prevent this. The decision to have a blood transfusion has been considered carefully by your caregiver before blood is given. Blood is not given unless the benefits outweigh the risks. AFTER THE TRANSFUSION  Right after receiving a blood transfusion, you will usually feel much better and more energetic. This is especially true if your red blood cells have gotten low (anemic). The transfusion raises the level of the red blood cells which carry oxygen, and this usually causes an energy increase.  The nurse administering the transfusion will monitor you carefully for complications. HOME CARE INSTRUCTIONS  No special instructions are needed after a transfusion. You may find your energy is better. Speak with your caregiver about any limitations on activity for underlying diseases  you may have. SEEK MEDICAL CARE IF:   Your condition is not improving after your transfusion.  You develop redness or irritation at the intravenous (IV) site. SEEK IMMEDIATE MEDICAL CARE IF:  Any of the following symptoms occur over the next 12 hours:  Shaking chills.  You have a temperature by mouth above 102 F (38.9 C), not controlled by medicine.  Chest, back, or muscle pain.  People around you feel you are not acting correctly or are confused.  Shortness of breath or difficulty breathing.  Dizziness and fainting.  You get a rash or develop hives.  You have a decrease in urine output.  Your urine turns a dark color or changes to pink, red, or brown. Any of the following symptoms occur over the next 10 days:  You have a temperature by mouth above 102 F (38.9 C), not controlled by medicine.  Shortness of breath.  Weakness after normal activity.  The white part of the eye turns yellow (jaundice).  You have a decrease in the amount of urine or are urinating less often.  Your urine turns a dark color or changes to pink, red, or brown. Document Released: 12/08/2000 Document Revised: 03/04/2012 Document Reviewed: 07/27/2008 [facility] Patient Information 2014 [place], [place].  _______________________________________________________________________

## 2019-07-19 LAB — SARS CORONAVIRUS 2 (TAT 6-24 HRS): SARS Coronavirus 2: NEGATIVE

## 2019-07-21 ENCOUNTER — Encounter: Payer: Self-pay | Admitting: Gynecologic Oncology

## 2019-07-21 ENCOUNTER — Encounter (HOSPITAL_COMMUNITY): Payer: Self-pay

## 2019-07-21 ENCOUNTER — Encounter (HOSPITAL_COMMUNITY)
Admission: RE | Admit: 2019-07-21 | Discharge: 2019-07-21 | Disposition: A | Discharge status: Home / Self Care | Payer: BC Managed Care – PPO | Source: Ambulatory Visit | Attending: Gynecologic Oncology | Admitting: Gynecologic Oncology

## 2019-07-21 ENCOUNTER — Other Ambulatory Visit: Payer: Self-pay

## 2019-07-21 DIAGNOSIS — Z01812 Encounter for preprocedural laboratory examination: Secondary | ICD-10-CM | POA: Insufficient documentation

## 2019-07-21 DIAGNOSIS — C50919 Malignant neoplasm of unspecified site of unspecified female breast: Secondary | ICD-10-CM | POA: Diagnosis not present

## 2019-07-21 DIAGNOSIS — Z1501 Genetic susceptibility to malignant neoplasm of breast: Secondary | ICD-10-CM | POA: Diagnosis not present

## 2019-07-21 DIAGNOSIS — K429 Umbilical hernia without obstruction or gangrene: Secondary | ICD-10-CM | POA: Insufficient documentation

## 2019-07-21 DIAGNOSIS — Z171 Estrogen receptor negative status [ER-]: Secondary | ICD-10-CM | POA: Diagnosis not present

## 2019-07-21 LAB — BASIC METABOLIC PANEL
Anion gap: 7 (ref 5–15)
BUN: 9 mg/dL (ref 6–20)
CO2: 28 mmol/L (ref 22–32)
Calcium: 9.5 mg/dL (ref 8.9–10.3)
Chloride: 105 mmol/L (ref 98–111)
Creatinine, Ser: 0.66 mg/dL (ref 0.44–1.00)
GFR calc Af Amer: >60 mL/min (ref >60)
GFR calc non Af Amer: >60 mL/min (ref >60)
Glucose, Bld: 100 mg/dL — ABNORMAL HIGH (ref 70–99)
Potassium: 4.1 mmol/L (ref 3.5–5.1)
Sodium: 140 mmol/L (ref 135–145)

## 2019-07-21 LAB — URINALYSIS, ROUTINE W REFLEX MICROSCOPIC
Bilirubin Urine: NEGATIVE
Glucose, UA: NEGATIVE mg/dL
Ketones, ur: NEGATIVE mg/dL
Leukocytes,Ua: NEGATIVE
Nitrite: NEGATIVE
Protein, ur: NEGATIVE mg/dL
Specific Gravity, Urine: 1.023 (ref 1.005–1.030)
pH: 5 (ref 5.0–8.0)

## 2019-07-21 LAB — CBC
HCT: 40.4 % (ref 36.0–46.0)
Hemoglobin: 13.1 g/dL (ref 12.0–15.0)
MCH: 29.4 pg (ref 26.0–34.0)
MCHC: 32.4 g/dL (ref 30.0–36.0)
MCV: 90.8 fL (ref 80.0–100.0)
Platelets: 255 10*3/uL (ref 150–400)
RBC: 4.45 MIL/uL (ref 3.87–5.11)
RDW: 12 % (ref 11.5–15.5)
WBC: 5.6 10*3/uL (ref 4.0–10.5)
nRBC: 0 % (ref 0.0–0.2)

## 2019-07-21 LAB — PREGNANCY, URINE: Preg Test, Ur: NEGATIVE

## 2019-07-21 NOTE — Anesthesia Preprocedure Evaluation (Addendum)
Anesthesia Evaluation  Patient identified by MRN, date of birth, ID band Patient awake    Reviewed: Allergy & Precautions, NPO status , Patient's Chart, lab work & pertinent test results  Airway Mallampati: II  TM Distance: >3 FB Neck ROM: Full    Dental  (+) Dental Advisory Given   Pulmonary former smoker,    breath sounds clear to auscultation       Cardiovascular negative cardio ROS   Rhythm:Regular Rate:Normal     Neuro/Psych  Headaches,    GI/Hepatic Neg liver ROS, GERD  ,  Endo/Other  Hyperthyroidism   Renal/GU negative Renal ROS     Musculoskeletal   Abdominal   Peds  Hematology negative hematology ROS (+)   Anesthesia Other Findings   Reproductive/Obstetrics                            Lab Results  Component Value Date   WBC 5.6 07/21/2019   HGB 13.1 07/21/2019   HCT 40.4 07/21/2019   MCV 90.8 07/21/2019   PLT 255 07/21/2019   Lab Results  Component Value Date   CREATININE 0.66 07/21/2019   BUN 9 07/21/2019   NA 140 07/21/2019   K 4.1 07/21/2019   CL 105 07/21/2019   CO2 28 07/21/2019    Anesthesia Physical Anesthesia Plan  ASA: II  Anesthesia Plan: General   Post-op Pain Management:    Induction: Intravenous  PONV Risk Score and Plan: 4 or greater and Scopolamine patch - Pre-op, Dexamethasone, Ondansetron, Treatment may vary due to age or medical condition and Midazolam  Airway Management Planned: Oral ETT  Additional Equipment:   Intra-op Plan:   Post-operative Plan: Extubation in OR  Informed Consent: I have reviewed the patients History and Physical, chart, labs and discussed the procedure including the risks, benefits and alternatives for the proposed anesthesia with the patient or authorized representative who has indicated his/her understanding and acceptance.     Dental advisory given  Plan Discussed with: CRNA  Anesthesia Plan Comments:         Anesthesia Quick Evaluation

## 2019-07-22 ENCOUNTER — Encounter (HOSPITAL_COMMUNITY): Payer: Self-pay

## 2019-07-22 ENCOUNTER — Telehealth: Payer: Self-pay

## 2019-07-22 ENCOUNTER — Encounter (HOSPITAL_COMMUNITY)
Admission: RE | Disposition: A | Discharge status: Home / Self Care | Payer: Self-pay | Source: Home / Self Care | Attending: Gynecologic Oncology

## 2019-07-22 ENCOUNTER — Ambulatory Visit (HOSPITAL_COMMUNITY): Payer: BC Managed Care – PPO | Admitting: Physician Assistant

## 2019-07-22 ENCOUNTER — Ambulatory Visit (HOSPITAL_COMMUNITY): Payer: BC Managed Care – PPO | Admitting: Certified Registered"

## 2019-07-22 ENCOUNTER — Ambulatory Visit (HOSPITAL_COMMUNITY)
Admission: RE | Admit: 2019-07-22 | Discharge: 2019-07-22 | Disposition: A | Discharge status: Home / Self Care | Payer: BC Managed Care – PPO | Attending: Gynecologic Oncology | Admitting: Gynecologic Oncology

## 2019-07-22 DIAGNOSIS — Z1501 Genetic susceptibility to malignant neoplasm of breast: Secondary | ICD-10-CM | POA: Diagnosis not present

## 2019-07-22 DIAGNOSIS — C50011 Malignant neoplasm of nipple and areola, right female breast: Secondary | ICD-10-CM

## 2019-07-22 DIAGNOSIS — Z803 Family history of malignant neoplasm of breast: Secondary | ICD-10-CM | POA: Diagnosis not present

## 2019-07-22 DIAGNOSIS — C50211 Malignant neoplasm of upper-inner quadrant of right female breast: Secondary | ICD-10-CM | POA: Diagnosis not present

## 2019-07-22 DIAGNOSIS — Z170 Estrogen receptor positive status [ER+]: Secondary | ICD-10-CM | POA: Diagnosis not present

## 2019-07-22 DIAGNOSIS — Z9221 Personal history of antineoplastic chemotherapy: Secondary | ICD-10-CM | POA: Insufficient documentation

## 2019-07-22 DIAGNOSIS — R8569 Abnormal cytological findings in specimens from other digestive organs and abdominal cavity: Secondary | ICD-10-CM | POA: Diagnosis not present

## 2019-07-22 DIAGNOSIS — K429 Umbilical hernia without obstruction or gangrene: Secondary | ICD-10-CM | POA: Insufficient documentation

## 2019-07-22 DIAGNOSIS — Z1509 Genetic susceptibility to other malignant neoplasm: Secondary | ICD-10-CM | POA: Diagnosis present

## 2019-07-22 DIAGNOSIS — Z87891 Personal history of nicotine dependence: Secondary | ICD-10-CM | POA: Diagnosis not present

## 2019-07-22 DIAGNOSIS — Z4002 Encounter for prophylactic removal of ovary: Secondary | ICD-10-CM | POA: Diagnosis not present

## 2019-07-22 DIAGNOSIS — Z853 Personal history of malignant neoplasm of breast: Secondary | ICD-10-CM | POA: Insufficient documentation

## 2019-07-22 DIAGNOSIS — Z1502 Genetic susceptibility to malignant neoplasm of ovary: Secondary | ICD-10-CM

## 2019-07-22 DIAGNOSIS — Z1379 Encounter for other screening for genetic and chromosomal anomalies: Secondary | ICD-10-CM

## 2019-07-22 HISTORY — PX: ROBOTIC ASSISTED TOTAL HYSTERECTOMY WITH BILATERAL SALPINGO OOPHERECTOMY: SHX6086

## 2019-07-22 LAB — TYPE AND SCREEN
ABO/RH(D): A POS
Antibody Screen: NEGATIVE

## 2019-07-22 LAB — ABO/RH: ABO/RH(D): A POS

## 2019-07-22 SURGERY — HYSTERECTOMY, TOTAL, ROBOT-ASSISTED, LAPAROSCOPIC, WITH BILATERAL SALPINGO-OOPHORECTOMY
Anesthesia: General | Laterality: Bilateral

## 2019-07-22 MED ORDER — BUPIVACAINE HCL 0.25 % IJ SOLN
INTRAMUSCULAR | Status: DC | PRN
  Administered 2019-07-22: 13 mL

## 2019-07-22 MED ORDER — PROPOFOL 10 MG/ML IV BOLUS
INTRAVENOUS | Status: DC | PRN
  Administered 2019-07-22: 100 mg via INTRAVENOUS

## 2019-07-22 MED ORDER — DEXAMETHASONE SODIUM PHOSPHATE 10 MG/ML IJ SOLN
INTRAMUSCULAR | Status: AC
  Filled 2019-07-22: qty 1

## 2019-07-22 MED ORDER — FENTANYL CITRATE (PF) 100 MCG/2ML IJ SOLN
INTRAMUSCULAR | Status: AC
  Filled 2019-07-22: qty 2

## 2019-07-22 MED ORDER — BUPIVACAINE HCL (PF) 0.25 % IJ SOLN
INTRAMUSCULAR | Status: AC
  Filled 2019-07-22: qty 30

## 2019-07-22 MED ORDER — DEXAMETHASONE SODIUM PHOSPHATE 10 MG/ML IJ SOLN
INTRAMUSCULAR | Status: DC | PRN
  Administered 2019-07-22: 5 mg via INTRAVENOUS

## 2019-07-22 MED ORDER — MIDAZOLAM HCL 5 MG/5ML IJ SOLN
INTRAMUSCULAR | Status: DC | PRN
  Administered 2019-07-22: 2 mg via INTRAVENOUS

## 2019-07-22 MED ORDER — PROMETHAZINE HCL 25 MG/ML IJ SOLN: 6.2500 mg | INTRAMUSCULAR | Status: DC | PRN

## 2019-07-22 MED ORDER — SUGAMMADEX SODIUM 200 MG/2ML IV SOLN
INTRAVENOUS | Status: DC | PRN
  Administered 2019-07-22: 200 mg via INTRAVENOUS

## 2019-07-22 MED ORDER — ACETAMINOPHEN 325 MG PO TABS: 650.0000 mg | ORAL_TABLET | ORAL | Status: DC | PRN

## 2019-07-22 MED ORDER — DEXAMETHASONE SODIUM PHOSPHATE 4 MG/ML IJ SOLN: 4.0000 mg | INTRAMUSCULAR | Status: DC

## 2019-07-22 MED ORDER — ACETAMINOPHEN 500 MG PO TABS
1000.0000 mg | ORAL_TABLET | ORAL | Status: AC
  Administered 2019-07-22: 06:00:00 1000 mg via ORAL
  Filled 2019-07-22: qty 2

## 2019-07-22 MED ORDER — FENTANYL CITRATE (PF) 250 MCG/5ML IJ SOLN
INTRAMUSCULAR | Status: DC | PRN
  Administered 2019-07-22: 50 ug via INTRAVENOUS
  Administered 2019-07-22: 100 ug via INTRAVENOUS

## 2019-07-22 MED ORDER — ROCURONIUM BROMIDE 10 MG/ML (PF) SYRINGE
PREFILLED_SYRINGE | INTRAVENOUS | Status: DC | PRN
  Administered 2019-07-22: 20 mg via INTRAVENOUS
  Administered 2019-07-22: 40 mg via INTRAVENOUS

## 2019-07-22 MED ORDER — EPHEDRINE 5 MG/ML INJ
INTRAVENOUS | Status: AC
  Filled 2019-07-22: qty 10

## 2019-07-22 MED ORDER — STERILE WATER FOR IRRIGATION IR SOLN
Status: DC | PRN
  Administered 2019-07-22: 1000 mL

## 2019-07-22 MED ORDER — PROPOFOL 10 MG/ML IV BOLUS
INTRAVENOUS | Status: AC
  Filled 2019-07-22: qty 20

## 2019-07-22 MED ORDER — LIDOCAINE HCL 2 % IJ SOLN
INTRAMUSCULAR | Status: AC
  Filled 2019-07-22: qty 40

## 2019-07-22 MED ORDER — LACTATED RINGERS IV SOLN
INTRAVENOUS | Status: DC
  Administered 2019-07-22: 06:00:00 via INTRAVENOUS

## 2019-07-22 MED ORDER — SCOPOLAMINE 1 MG/3DAYS TD PT72
1.0000 | MEDICATED_PATCH | TRANSDERMAL | Status: DC
  Administered 2019-07-22: 06:00:00 1.5 mg via TRANSDERMAL
  Filled 2019-07-22: qty 1

## 2019-07-22 MED ORDER — KETAMINE HCL 10 MG/ML IJ SOLN
INTRAMUSCULAR | Status: AC
  Filled 2019-07-22: qty 1

## 2019-07-22 MED ORDER — CEFAZOLIN SODIUM-DEXTROSE 2-4 GM/100ML-% IV SOLN
2.0000 g | INTRAVENOUS | Status: AC
  Administered 2019-07-22: 2 g via INTRAVENOUS
  Filled 2019-07-22: qty 100

## 2019-07-22 MED ORDER — FENTANYL CITRATE (PF) 250 MCG/5ML IJ SOLN
INTRAMUSCULAR | Status: AC
  Filled 2019-07-22: qty 5

## 2019-07-22 MED ORDER — SODIUM CHLORIDE 0.9 % IV SOLN: 250.0000 mL | INTRAVENOUS | Status: DC | PRN

## 2019-07-22 MED ORDER — SODIUM CHLORIDE 0.9% FLUSH: 3.0000 mL | Freq: Two times a day (BID) | INTRAVENOUS | Status: DC

## 2019-07-22 MED ORDER — CELECOXIB 200 MG PO CAPS
400.0000 mg | ORAL_CAPSULE | ORAL | Status: AC
  Administered 2019-07-22: 400 mg via ORAL
  Filled 2019-07-22: qty 2

## 2019-07-22 MED ORDER — SODIUM CHLORIDE 0.9% FLUSH: 3.0000 mL | INTRAVENOUS | Status: DC | PRN

## 2019-07-22 MED ORDER — OXYCODONE HCL 5 MG PO TABS: 5.0000 mg | ORAL_TABLET | ORAL | Status: DC | PRN

## 2019-07-22 MED ORDER — ONDANSETRON HCL 4 MG/2ML IJ SOLN
INTRAMUSCULAR | Status: AC
  Filled 2019-07-22: qty 2

## 2019-07-22 MED ORDER — KETAMINE HCL 10 MG/ML IJ SOLN
INTRAMUSCULAR | Status: DC | PRN
  Administered 2019-07-22: 10 mg via INTRAVENOUS
  Administered 2019-07-22: 20 mg via INTRAVENOUS

## 2019-07-22 MED ORDER — PHENYLEPHRINE 40 MCG/ML (10ML) SYRINGE FOR IV PUSH (FOR BLOOD PRESSURE SUPPORT)
PREFILLED_SYRINGE | INTRAVENOUS | Status: AC
  Filled 2019-07-22: qty 10

## 2019-07-22 MED ORDER — LIDOCAINE 2% (20 MG/ML) 5 ML SYRINGE
INTRAMUSCULAR | Status: AC
  Filled 2019-07-22: qty 5

## 2019-07-22 MED ORDER — MORPHINE SULFATE (PF) 4 MG/ML IV SOLN: 2.0000 mg | INTRAVENOUS | Status: DC | PRN

## 2019-07-22 MED ORDER — KETOROLAC TROMETHAMINE 30 MG/ML IJ SOLN: 30.0000 mg | Freq: Four times a day (QID) | INTRAMUSCULAR | Status: DC

## 2019-07-22 MED ORDER — STERILE WATER FOR INJECTION IJ SOLN
INTRAMUSCULAR | Status: AC
  Filled 2019-07-22: qty 10

## 2019-07-22 MED ORDER — ACETAMINOPHEN 650 MG RE SUPP
650.0000 mg | RECTAL | Status: DC | PRN
  Filled 2019-07-22: qty 1

## 2019-07-22 MED ORDER — MIDAZOLAM HCL 2 MG/2ML IJ SOLN
INTRAMUSCULAR | Status: AC
  Filled 2019-07-22: qty 2

## 2019-07-22 MED ORDER — FENTANYL CITRATE (PF) 100 MCG/2ML IJ SOLN
25.0000 ug | INTRAMUSCULAR | Status: DC | PRN
  Administered 2019-07-22 (×3): 50 ug via INTRAVENOUS

## 2019-07-22 MED ORDER — ONDANSETRON HCL 4 MG/2ML IJ SOLN
INTRAMUSCULAR | Status: DC | PRN
  Administered 2019-07-22: 4 mg via INTRAVENOUS

## 2019-07-22 MED ORDER — LACTATED RINGERS IR SOLN
Status: DC | PRN
  Administered 2019-07-22: 1000 mL

## 2019-07-22 MED ORDER — SUCCINYLCHOLINE CHLORIDE 200 MG/10ML IV SOSY
PREFILLED_SYRINGE | INTRAVENOUS | Status: AC
  Filled 2019-07-22: qty 10

## 2019-07-22 MED ORDER — ROCURONIUM BROMIDE 10 MG/ML (PF) SYRINGE
PREFILLED_SYRINGE | INTRAVENOUS | Status: AC
  Filled 2019-07-22: qty 10

## 2019-07-22 SURGICAL SUPPLY — 56 items
APPLICATOR SURGIFLO ENDO (HEMOSTASIS) IMPLANT
BAG LAPAROSCOPIC 12 15 PORT 16 (BASKET) IMPLANT
BAG RETRIEVAL 12/15 (BASKET)
COVER BACK TABLE 60X90IN (DRAPES) ×2 IMPLANT
COVER TIP SHEARS 8 DVNC (MISCELLANEOUS) ×1 IMPLANT
COVER TIP SHEARS 8MM DA VINCI (MISCELLANEOUS) ×1
COVER WAND RF STERILE (DRAPES) IMPLANT
DECANTER SPIKE VIAL GLASS SM (MISCELLANEOUS) IMPLANT
DERMABOND ADVANCED (GAUZE/BANDAGES/DRESSINGS) ×1
DERMABOND ADVANCED .7 DNX12 (GAUZE/BANDAGES/DRESSINGS) ×1 IMPLANT
DRAIN CHANNEL RND F F (WOUND CARE) IMPLANT
DRAPE ARM DVNC X/XI (DISPOSABLE) ×4 IMPLANT
DRAPE COLUMN DVNC XI (DISPOSABLE) ×1 IMPLANT
DRAPE DA VINCI XI ARM (DISPOSABLE) ×4
DRAPE DA VINCI XI COLUMN (DISPOSABLE) ×1
DRAPE SHEET LG 3/4 BI-LAMINATE (DRAPES) ×2 IMPLANT
DRAPE SURG IRRIG POUCH 19X23 (DRAPES) ×2 IMPLANT
ELECT REM PT RETURN 15FT ADLT (MISCELLANEOUS) ×2 IMPLANT
GAUZE 4X4 16PLY RFD (DISPOSABLE) IMPLANT
GLOVE BIO SURGEON STRL SZ 6 (GLOVE) ×8 IMPLANT
GLOVE BIO SURGEON STRL SZ 6.5 (GLOVE) ×3 IMPLANT
GOWN STRL REUS W/ TWL LRG LVL3 (GOWN DISPOSABLE) ×4 IMPLANT
GOWN STRL REUS W/TWL LRG LVL3 (GOWN DISPOSABLE) ×4
HOLDER FOLEY CATH W/STRAP (MISCELLANEOUS) ×2 IMPLANT
IRRIG SUCT STRYKERFLOW 2 WTIP (MISCELLANEOUS) ×2
IRRIGATION SUCT STRKRFLW 2 WTP (MISCELLANEOUS) ×1 IMPLANT
KIT PROCEDURE DA VINCI SI (MISCELLANEOUS)
KIT PROCEDURE DVNC SI (MISCELLANEOUS) IMPLANT
KIT TURNOVER KIT A (KITS) IMPLANT
MANIPULATOR UTERINE 4.5 ZUMI (MISCELLANEOUS) ×2 IMPLANT
NDL SPNL 18GX3.5 QUINCKE PK (NEEDLE) IMPLANT
NEEDLE HYPO 22GX1.5 SAFETY (NEEDLE) IMPLANT
NEEDLE SPNL 18GX3.5 QUINCKE PK (NEEDLE) IMPLANT
OBTURATOR OPTICAL STANDARD 8MM (TROCAR) ×1
OBTURATOR OPTICAL STND 8 DVNC (TROCAR) ×1
OBTURATOR OPTICALSTD 8 DVNC (TROCAR) ×1 IMPLANT
OCCLUDER COLPOPNEUMO (BALLOONS) ×1 IMPLANT
PACK ROBOT GYN CUSTOM WL (TRAY / TRAY PROCEDURE) ×2 IMPLANT
PAD POSITIONING PINK XL (MISCELLANEOUS) ×2 IMPLANT
PORT ACCESS TROCAR AIRSEAL 12 (TROCAR) ×1 IMPLANT
PORT ACCESS TROCAR AIRSEAL 5M (TROCAR) ×1
POUCH SPECIMEN RETRIEVAL 10MM (ENDOMECHANICALS) IMPLANT
SEAL CANN UNIV 5-8 DVNC XI (MISCELLANEOUS) ×3 IMPLANT
SEAL XI 5MM-8MM UNIVERSAL (MISCELLANEOUS) ×3
SET TRI-LUMEN FLTR TB AIRSEAL (TUBING) ×2 IMPLANT
SURGIFLO W/THROMBIN 8M KIT (HEMOSTASIS) IMPLANT
SUT PDS AB 1 CT1 27 (SUTURE) ×2 IMPLANT
SUT VIC AB 0 CT1 27 (SUTURE)
SUT VIC AB 0 CT1 27XBRD ANTBC (SUTURE) IMPLANT
SUT VIC AB 4-0 PS2 18 (SUTURE) ×4 IMPLANT
SYR 10ML LL (SYRINGE) IMPLANT
TOWEL OR NON WOVEN STRL DISP B (DISPOSABLE) ×2 IMPLANT
TRAP SPECIMEN MUCOUS 40CC (MISCELLANEOUS) ×1 IMPLANT
TRAY FOLEY MTR SLVR 16FR STAT (SET/KITS/TRAYS/PACK) ×2 IMPLANT
UNDERPAD 30X30 (UNDERPADS AND DIAPERS) ×2 IMPLANT
WATER STERILE IRR 1000ML POUR (IV SOLUTION) ×2 IMPLANT

## 2019-07-22 NOTE — Op Note (Signed)
OPERATIVE NOTE 07/22/19  Surgeon: Rossi, Emma Caroline   Assistants: Dr Lisa Jackson-Moore (an MD assistant was necessary for tissue manipulation, management of robotic instrumentation, retraction and positioning due to the complexity of the case and hospital policies).   Anesthesia: General endotracheal anesthesia  ASA Class: 3   Pre-operative Diagnosis: BRCA 1 deleterious mutation., umbilical hernia  Post-operative Diagnosis: same  Operation: Robotic-assisted laparoscopic total hysterectomy with bilateral salpingoophorectomy, umbilical hernia repair  Surgeon: Rossi, Emma Caroline  Assistant Surgeon: Lisa Jackson Moore MD, Melissa Cross NP.   Anesthesia: GET  Urine Output: 100cc  Operative Findings:  : 8cm normal appearing uterus tubes and ovaries. 1cm umbilical hernia.   Estimated Blood Loss:  20cc      Total IV Fluids: 800 ml         Specimens: washings, uterus with bilateral tubes and ovaries.          Complications:  None; patient tolerated the procedure well.         Disposition: PACU - hemodynamically stable.  Procedure Details  The patient was seen in the Holding Room. The risks, benefits, complications, treatment options, and expected outcomes were discussed with the patient.  The patient concurred with the proposed plan, giving informed consent.  The site of surgery properly noted/marked. The patient was identified as [name] and the procedure verified as a Robotic-assisted hysterectomy with bilateral salpingo oophorectomy. A Time Out was held and the above information confirmed.  After induction of anesthesia, the patient was draped and prepped in the usual sterile manner. Pt was placed in supine position after anesthesia and draped and prepped in the usual sterile manner. The abdominal drape was placed after the CholoraPrep had been allowed to dry for 3 minutes.  Her arms were tucked to her side with all appropriate precautions.  The shoulders were stabilized  with padded shoulder blocks applied to the acromium processes.  The patient was placed in the semi-lithotomy position in Allen stirrups.  The perineum was prepped with Betadine. The patient was then prepped. Foley catheter was placed.  A sterile speculum was placed in the vagina.  The cervix was grasped with a single-tooth tenaculum and dilated with Pratt dilators.  The ZUMI uterine manipulator with a medium colpotomizer ring was placed without difficulty.  A pneum occluder balloon was placed over the manipulator.  OG tube placement was confirmed and to suction.   Next, a 5 mm skin incision was made 1 cm below the subcostal margin in the midclavicular line.  The 5 mm Optiview port and scope was used for direct entry.  Opening pressure was under 10 mm CO2.  The abdomen was insufflated and the findings were noted as above.   At this point and all points during the procedure, the patient's intra-abdominal pressure did not exceed 15 mmHg. Next, a 10 mm skin incision was made in the umbilicus and a right and left port was placed about 10 cm lateral to the robot port on the right and left side.   All ports were placed under direct visualization.  The patient was placed in steep Trendelenburg.  Bowel was folded away into the upper abdomen.  The robot was docked in the normal manner.  The hysterectomy was started after the round ligament on the right side was incised and the retroperitoneum was entered and the pararectal space was developed.  The ureter was noted to be on the medial leaf of the broad ligament.  The peritoneum above the ureter was incised and   stretched and the infundibulopelvic ligament was skeletonized, cauterized and cut.  The posterior peritoneum was taken down to the level of the KOH ring.  The anterior peritoneum was also taken down.  The bladder flap was created to the level of the KOH ring.  The uterine artery on the right side was skeletonized, cauterized and cut in the normal manner.  A similar  procedure was performed on the left.  The colpotomy was made and the uterus, cervix, bilateral ovaries and tubes were amputated and delivered through the vagina.  Pedicles were inspected and excellent hemostasis was achieved.  The colpotomy at the vaginal cuff was closed with Vicryl on a CT1 needle in a running manner.  Irrigation was used and excellent hemostasis was achieved.  At this point in the robotic procedure was completed.  Robotic instruments were removed under direct visulaization.  The robot was undocked.  The umbilical incision was extended to 2cm and the fascial edges were freshened. They were grasped with kochers and the fascial defect at the umbilicus was closed with 0-PDS interrupted suture with buried knots. The subcutaneous tissues were reapproximated with vicryl. The 10 mm ports were closed with Vicryl on a UR-5 needle and the fascia was closed with 0 Vicryl on a UR-5 needle.  The skin was closed with 4-0 Vicryl in a subcuticular manner.  Dermabond was applied.  Sponge, lap and needle counts correct x 2.  The patient was taken to the recovery room in stable condition.  The vagina was swabbed with  minimal bleeding noted.   All instrument and needle counts were correct x  3.   The patient was transferred to the recovery room in a stable condition.  Rossi, Emma Caroline, MD   

## 2019-07-22 NOTE — Anesthesia Postprocedure Evaluation (Signed)
Anesthesia Post Note  Patient: [name]  Procedure(s) Performed: XI ROBOTIC ASSISTED TOTAL HYSTERECTOMY WITH BILATERAL SALPINGO OOPHORECTOMY WITH UMBILICAL HERNIA REPAIR (Bilateral )     Patient location during evaluation: PACU Anesthesia Type: General Level of consciousness: awake and alert Pain management: pain level controlled Vital Signs Assessment: post-procedure vital signs reviewed and stable Respiratory status: spontaneous breathing, nonlabored ventilation, respiratory function stable and patient connected to nasal cannula oxygen Cardiovascular status: blood pressure returned to baseline and stable Postop Assessment: no apparent nausea or vomiting Anesthetic complications: no    Last Vitals:  Vitals:   07/22/19 1030 07/22/19 1119  BP: 110/65 (!) 99/51  Pulse: 91 92  Resp: 16 16  Temp: 36.6 C 37.1 C  SpO2: 100% 100%    Last Pain:  Vitals:   07/22/19 1119  TempSrc: Oral  PainSc: 5                  Tiajuana Amass

## 2019-07-22 NOTE — Anesthesia Procedure Notes (Signed)
Procedure Name: Intubation Date/Time: 07/22/2019 7:34 AM Performed by: Myna Bright, CRNA Pre-anesthesia Checklist: Patient identified, Emergency Drugs available, Suction available and Patient being monitored Patient Re-evaluated:Patient Re-evaluated prior to induction Oxygen Delivery Method: Circle system utilized Preoxygenation: Pre-oxygenation with 100% oxygen Induction Type: IV induction Ventilation: Mask ventilation without difficulty Laryngoscope Size: Mac and 3 Grade View: Grade I Tube type: Oral Tube size: 7.0 mm Number of attempts: 1 Airway Equipment and Method: Stylet Placement Confirmation: ETT inserted through vocal cords under direct vision,  positive ETCO2 and breath sounds checked- equal and bilateral Secured at: 21 cm Dental Injury: Teeth and Oropharynx as per pre-operative assessment

## 2019-07-22 NOTE — Telephone Encounter (Signed)
Told [name] that [name], NP said that she could take 1-2 of the Oxycodone every 4 hours for the pain if needed. Jamie Silvestri states that the gas pain is beginning to decrease.  Encouraged her to get up and move around as this will help dissipate the gas. Pt verbalized understanding.

## 2019-07-22 NOTE — Transfer of Care (Signed)
Immediate Anesthesia Transfer of Care Note  Patient: [name]  Procedure(s) Performed: XI ROBOTIC ASSISTED TOTAL HYSTERECTOMY WITH BILATERAL SALPINGO OOPHORECTOMY WITH UMBILICAL HERNIA REPAIR (Bilateral )  Patient Location: PACU  Anesthesia Type:General  Level of Consciousness: awake, alert , oriented and patient cooperative  Airway & Oxygen Therapy: Patient Spontanous Breathing and Patient connected to face mask oxygen  Post-op Assessment: Report given to RN, Post -op Vital signs reviewed and stable and Patient moving all extremities  Post vital signs: Reviewed and stable  Last Vitals:  Vitals Value Taken Time  BP 110/65 07/22/19 1030  Temp 36.6 C 07/22/19 1030  Pulse 82 07/22/19 1032  Resp 17 07/22/19 1032  SpO2 100 % 07/22/19 1032  Vitals shown include unvalidated device data.  Last Pain:  Vitals:   07/22/19 1030  TempSrc:   PainSc: Asleep      Patients Stated Pain Goal: 1 (62/[id number]/38 [id number])  Complications: No apparent anesthesia complications

## 2019-07-22 NOTE — H&P (Signed)
Follow-up Note: Gyn-Onc  Consult was initially requested by Dr. Lindi Adie for the evaluation of [name] 33 y.o. female  CC:  BRCA 1 gene mutation, desires risk reducing surgery  Assessment/Plan:  [name]  is a 33 y.o.  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  She desires risk reducing hysterectomy, BSO.  HPI: [name] is a 33 year old G2P3 who is seen in consultation at the request of Dr Lindi Adie for a deleterious mutation in BRCA 1 and stage IB triple negative breast cancer.  Patient was diagnosed with breast cancer in February 2019.  This is been treated to date with neoadjuvant chemotherapy which she has completed on July 08, 2018.  She tolerated therapy fairly well.  The patient has a strong family history for breast cancer including both aunts who noted to be BRCA1 positive.  She has no known family history for ovarian cancer.  Her aunts are on the paternal side.  She does not have a relationship with her father.  She has siblings who have not been tested and have no history of cancer.  She underwent genetic testing herself on February 20, 2018.  This revealed a deleterious  mutation in BRCA1 as well as variance of uncertain significance identified in MLH1 and RAD51C.  She is planning to undergo definitive risk reducing bilateral mastectomies and reconstruction, ideally in this coming month with Dr. Donne Hazel.  Patient's gynecologic history is significant for amenorrhea since March 2019 when she commenced chemotherapy.  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  During that time she was diagnosed with endometriosis.  She underwent Clomid therapy and conceived twins spontaneously in 2014.  Prior to that she had a delivery of full-term infant approximately  58 years ago via cesarean section.  Her twins were also delivered via cesarean section.  She is a history of a tubal ligation was performed at the time of cesarean cesarean section.  Her only other prior surgeries were laparoscopies performed at the time of infertility treatment.  She has no prior history of abnormal Pap smears.  Her family history is significant for a paternal aunt who developed breast cancer in her 60s and was noted to be BRCA 1 germline positive.  An alternative paternal aunt also died of breast cancer in her 60s and was also BRCA1 positive.  A maternal aunt has a history of colon cancer in her 32s.  A maternal uncle died from lung cancer, he was a smoker.  CT scan in February, 2019 showed normal ovaries and uterus. CA 125 on 07/08/18 was normal at 9.4.  Interval Hx:  CA 125 pm 02/13/19 was normal at 15.7.   She underwent breast reconstructive surgery in November, 2019. She requires a revision of one of the reconstruction sites and this is planned for April.   She desires risk reducing hysterectomy, BSO in May late 2020.   TVUS on 02/13/19 showed a 9.5cm uterus with blood clot within the cavity. Ovaries bilaterally were small and normal.   Current Meds:  Outpatient Encounter Medications as of 02/14/2019  Medication Sig  . fluticasone (FLONASE) 50 MCG/ACT nasal spray Place 2 sprays into both nostrils daily.  Marland Kitchen ibuprofen (ADVIL,MOTRIN) 200 MG tablet Take 200 mg by mouth every 6 (six) hours as needed.  Marland Kitchen  loratadine (CLARITIN) 10 MG tablet Take 10 mg by mouth daily.  . [DISCONTINUED] Cyclobenzaprine HCl (FLEXERIL PO) Take by mouth at bedtime.   No facility-administered encounter medications on file as of 02/14/2019.     Allergy:  Allergies  Allergen Reactions  . Lidocaine Other (See Comments)    NUMBNESS AND TINGLING ARMS AND LEGS    Social Hx:   Social History   Socioeconomic History  . Marital status: Married    Spouse name: Not on file  . Number of children: Not  on file  . Years of education: Not on file  . Highest education level: Not on file  Occupational History  . Not on file  Social Needs  . Financial resource strain: Not on file  . Food insecurity    Worry: Not on file    Inability: Not on file  . Transportation needs    Medical: Not on file    Non-medical: Not on file  Tobacco Use  . Smoking status: Former Smoker    Quit date: 12/25/2003    Years since quitting: 15.5  . Smokeless tobacco: Never Used  Substance and Sexual Activity  . Alcohol use: No  . Drug use: No  . Sexual activity: Not Currently  Lifestyle  . Physical activity    Days per week: Not on file    Minutes per session: Not on file  . Stress: Not on file  Relationships  . Social Herbalist on phone: Not on file    Gets together: Not on file    Attends religious service: Not on file    Active member of club or organization: Not on file    Attends meetings of clubs or organizations: Not on file    Relationship status: Not on file  . Intimate partner violence    Fear of current or ex partner: Not on file    Emotionally abused: Not on file    Physically abused: Not on file    Forced sexual activity: Not on file  Other Topics Concern  . Not on file  Social History Narrative  . Not on file    Past Surgical Hx:  Past Surgical History:  Procedure Laterality Date  . BREAST RECONSTRUCTION WITH PLACEMENT OF TISSUE EXPANDER AND ALLODERM Bilateral 07/29/2018   Procedure: BILATERAL BREAST RECONSTRUCTION WITH PLACEMENT OF TISSUE EXPANDER AND ALLODERM;  Surgeon: Irene Limbo, MD;  Location: [facility];  Service: Plastics;  Laterality: Bilateral;  . CESAREAN SECTION    . CESAREAN SECTION WITH BILATERAL TUBAL LIGATION Bilateral 11/19/2013   Procedure: REPEAT CESAREAN SECTION WITH BILATERAL TUBAL LIGATION; TWINS;  Surgeon: Lovenia Kim, MD;  Location: [facility];  Service: Obstetrics;  Laterality: Bilateral;  EDD: 12/09/13  . CHOLECYSTECTOMY     . PORTA CATH REMOVAL Right 11/19/2018   Procedure: PORTA CATH REMOVAL;  Surgeon: Irene Limbo, MD;  Location: [facility];  Service: Plastics;  Laterality: Right;  . PORTACATH PLACEMENT N/A 02/20/2018   Procedure: INSERTION PORT-A-CATH WITH ULTRASOUND;  Surgeon: Rolm Bookbinder, MD;  Location: [facility];  Service: General;  Laterality: N/A;  . REMOVAL OF BILATERAL TISSUE EXPANDERS WITH PLACEMENT OF BILATERAL BREAST IMPLANTS Bilateral 11/19/2018   Procedure: REMOVAL OF BILATERAL TISSUE EXPANDERS WITH PLACEMENT OF BILATERAL BREAST IMPLANTS;  Surgeon: Irene Limbo, MD;  Location: [facility];  Service: Plastics;  Laterality: Bilateral;    Past Medical Hx:  Past Medical History:  Diagnosis Date  . Cluster headaches   .  Complication of anesthesia    severe hypotension to the point of syncope  . Family history of breast cancer   . Family history of colon cancer   . Family history of stomach cancer   . History of breast cancer 01/2018   right  . History of chemotherapy    finished 07/05/2018  . History of palpitations    due to low K+  . Hyperthyroidism    no current med.  . Infection of left breast 11/11/2018   to start antibiotic 11/11/2018  . Irritable bowel syndrome (IBS)    no current med.    Past Gynecological History:  G2P2003 Patient's last menstrual period was 07/21/2019.  Family Hx:  Family History  Problem Relation Age of Onset  . Cirrhosis Father   . Breast cancer Paternal Aunt 103       BRCA1 [id number] pos  . Heart disease Maternal Grandfather        Heart attack  . Breast cancer Paternal Aunt        dx in her 30s  . Colon cancer Maternal Aunt        dx in her 67s  . Lupus Sister        pat 1/2 sister  . Other Brother 36       pat 1/2 brother died in [place]  . Lung cancer Maternal Uncle        smoker  . Other Paternal Aunt        died in a MVA  . Other Paternal Aunt        died from poisioning    Review  of Systems:  Constitutional  Feels well,    ENT Normal appearing ears and nares bilaterally Skin/Breast  No rash, sores, jaundice, itching, dryness Cardiovascular  No chest pain, shortness of breath, or edema  Pulmonary  No cough or wheeze.  Gastro Intestinal  No nausea, vomitting, or diarrhoea. No bright red blood per rectum, no abdominal pain, change in bowel movement, or constipation.  Genito Urinary  No frequency, urgency, dysuria, no bleeding, amenorrhic Musculo Skeletal  No myalgia, arthralgia, joint swelling or pain  Neurologic  No weakness, numbness, change in gait,  Psychology  No depression, anxiety, insomnia.   Vitals:  Blood pressure 99/68, pulse 66, temperature 98.3 F (36.8 C), temperature source Oral, resp. rate 16, height _0  (1.549 m), weight 131 lb 4 oz (59.5 kg), last menstrual period 07/21/2019, SpO2 100 %.  Physical Exam: WD in NAD Neck  Supple NROM, without any enlargements.  Lymph Node Survey No cervical supraclavicular or inguinal adenopathy Cardiovascular  Pulse normal rate, regularity and rhythm. S1 and S2 normal.  Lungs  Clear to auscultation bilateraly, without wheezes/crackles/rhonchi. Good air movement.  Skin  No rash/lesions/breakdown  Psychiatry  Alert and oriented to person, place, and time  Abdomen  Normoactive bowel sounds, abdomen soft, non-tender and thin without evidence of hernia.  Back No CVA tenderness Genito Urinary  Vulva/vagina: Normal external female genitalia.   No lesions. No discharge or bleeding.  Bladder/urethra:  No lesions or masses, well supported bladder  Vagina: no palpable mass  Cervix: Normal appearing, no lesions.  Uterus:  Small, mobile, no parametrial involvement or nodularity.  Adnexa: no masses. Rectal  deferred Extremities  No bilateral cyanosis, clubbing or edema.   Thereasa Solo, MD  07/22/2019, 7:05 AM

## 2019-07-22 NOTE — Telephone Encounter (Signed)
Ms Kaufman states that she is experiencing sever gas pain in her lower back and  Shoulder. She has been using a heating pad and took an Oxycodone 5 mg tab about an hour ago with little effect. Told her that  She could get some gas-X extra strength and take. Suggested that she take the Ibuprofen  800 mg now as well. Pt. Will call if pain does not improve.

## 2019-07-22 NOTE — Discharge Instructions (Signed)
Return to work: 4 weeks (2 weeks with physical restrictions).  Activity: 1. Be up and out of the bed during the day.  Take a nap if needed.  You may walk up steps but be careful and use the hand rail.  Stair climbing will tire you more than you think, you may need to stop part way and rest.   2. No lifting or straining for 4 weeks.  3. No driving for 1 weeks.  Do Not drive if you are taking narcotic pain medicine.  4. Shower daily.  Use soap and water on your incision and pat dry; don't rub.   5. No sexual activity and nothing in the vagina for 8 weeks.  Medications:  - Take ibuprofen and tylenol first line for pain control. Take these regularly (every 6 hours) to decrease the build up of pain.  - If necessary, for severe pain not relieved by ibuprofen, contact Dr Serita Grit office and you will be prescribed percocet.  - While taking percocet you should take sennakot every night to reduce the likelihood of constipation. If this causes diarrhea, stop its use.  Diet: 1. Low sodium Heart Healthy Diet is recommended.  2. It is safe to use a laxative if you have difficulty moving your bowels.   Wound Care: 1. Keep clean and dry.  Shower daily.  Reasons to call the Doctor:   Fever - Oral temperature greater than 100.4 degrees Fahrenheit  Foul-smelling vaginal discharge  Difficulty urinating  Nausea and vomiting  Increased pain at the site of the incision that is unrelieved with pain medicine.  Difficulty breathing with or without chest pain  New calf pain especially if only on one side  Sudden, continuing increased vaginal bleeding with or without clots.   Follow-up: 1. See Everitt Amber in 4 weeks.  Contacts: For questions or concerns you should contact:  Dr. Everitt Amber at [phone] After hours and on week-ends call [phone] and ask to speak to the physician on call for Gynecologic Oncology  After Your Surgery  The information in this section will tell you what  to expect after your surgery, both during your stay and after you leave. You will learn how to safely recover from your surgery. Write down any questions you have and be sure to ask your doctor or nurse. What to Expect When you wake up after your surgery, you will be in the [facility] ([facility]) or your recovery room. A nurse will be monitoring your body temperature, blood pressure, pulse, and oxygen levels. You may have a urinary catheter in your bladder to help monitor the amount of urine you are making. It should come out before you go home. You will also have compression boots on your lower legs to help your circulation. Your pain medication will be given through an IV line or in tablet form. If you are having pain, tell your nurse. Your nurse will tell you how to recover from your surgery. Below are examples of ways you can help yourself recover safely.  You will be encouraged to walk with the help of your nurse or physical therapist. We will give you medication to relieve pain. Walking helps reduce the risk for blood clots and pneumonia. It also helps to stimulate your bowels so they begin working again.  Use your incentive spirometer. This will help your lungs expand, which prevents pneumonia. For more information, read How to Use Your Incentive Spirometer. Commonly Asked Questions Will I have pain after  surgery? Yes, you will have some pain after your surgery, especially in the first few days. Your doctor and nurse will ask you about your pain often. You will be given medication to manage your pain as needed. If your pain is not relieved, please tell your doctor or nurse. It is important to control your pain so you can cough, breathe deeply, use your incentive spirometer, and get out of bed and walk. Will I be able to eat? Yes, you will be able to eat a regular diet or eat as tolerated. You should start with foods that are soft and easy to digest such as apple sauce and chicken  noodle soup. Eat small meals frequently, and then advance to regular foods. If you experience bloating, gas, or cramps, limit high-fiber foods, including whole grain breads and cereal, nuts, seeds, salads, fresh fruit, broccoli, cabbage, and cauliflower. Will I have pain when I am home? The length of time each person has pain or discomfort varies. You may still have some pain when you go home and will probably be taking pain medication. Follow the guidelines below.  Take your medications as directed and as needed.  Call your doctor if the medication prescribed for you doesn't relieve your pain.  Don't drive or drink alcohol while you're taking prescription pain medication.  As your incision heals, you will have less pain and need less pain medication. A mild pain reliever such as acetaminophen (Tylenol) or ibuprofen (Advil) will relieve aches and discomfort. However, large quantities of acetaminophen may be harmful to your liver. Don't take more acetaminophen than the amount directed on the bottle or as instructed by your doctor or nurse.  Pain medication should help you as you resume your normal activities. Take enough medication to do your exercises comfortably. Pain medication is most effective 30 to 45 minutes after taking it.  Keep track of when you take your pain medication. Taking it when your pain first begins is more effective than waiting for the pain to get worse. Pain medication may cause constipation (having fewer bowel movements than what is normal for you). How can I prevent constipation?  Go to the bathroom at the same time every day. Your body will get used to going at that time.  If you feel the urge to go, don't put it off. Try to use the bathroom 5 to 15 minutes after meals.  After breakfast is a Kaysen Sefcik time to move your bowels. The reflexes in your colon are strongest at this time.  Exercise, if you can. Walking is an excellent form of exercise.  Drink 8 (8-ounce)  glasses (2 liters) of liquids daily, if you can. Drink water, juices, soups, ice cream shakes, and other drinks that don't have caffeine. Drinks with caffeine, such as coffee and soda, pull fluid out of the body.  Slowly increase the fiber in your diet to 25 to 35 grams per day. Fruits, vegetables, whole grains, and cereals contain fiber. If you have an ostomy or have had recent bowel surgery, check with your doctor or nurse before making any changes in your diet.  Both over-the-counter and prescription medications are available to treat constipation. Start with 1 of the following over-the-counter medications first: o Docusate sodium (Colace) 100 mg. Take ___1__ capsules _2____ times a day. This is a stool softener that causes few side effects. Don't take it with mineral oil. o Polyethylene glycol (MiraLAX) 17 grams daily. o Senna (Senokot) 2 tablets at bedtime. This is a stimulant laxative,  which can cause cramping.  If you haven't had a bowel movement in 2 days, call your doctor or nurse. Can I shower? Yes, you should shower 24 hours after your surgery. Be sure to shower every day. Taking a warm shower is relaxing and can help decrease muscle aches. Use soap when you shower and gently wash your incision. Pat the areas dry with a towel after showering, and leave your incision uncovered (unless there is drainage). Call your doctor if you see any redness or drainage from your incision. Don't take tub baths until you discuss it with your doctor at the first appointment after your surgery. How do I care for my incisions? You will have several small incisions on your abdomen. The incisions are closed with Steri-Strips or Dermabond. You may also have square white dressings on your incisions (Prim[place]). You can remove these in the shower 24 hours after your surgery. You should clean your incisions with soap and water. If you go home with Steri-Strips on your incision, they will loosen and may fall off  by themselves. If they haven't fallen off within 10 days, you can remove them. If you go home with Dermabond over your sutures (stitches), it will also loosen and peel off. What are the most common symptoms after a hysterectomy? It's common for you to have some vaginal spotting or light bleeding. You should monitor this with a pad or a panty liner. If you have having heavy bleeding (bleeding through a pad or liner every 1 to 2 hours), call your doctor right away. It's also common to have some discomfort after surgery from the air that was pumped into your abdomen during surgery. To help with this, walk, drink plenty of liquids and make sure to take the stool softeners you received. When is it safe for me to drive? You may resume driving 2 weeks after surgery, as long as you are not taking pain medication that may make you drowsy. When can I resume sexual activity? Do not place anything in your vagina or have vaginal intercourse for 8 weeks after your surgery. Some people will need to wait longer than 8 weeks, so speak with your doctor before resuming sexual intercourse. Will I be able to travel? Yes, you can travel. If you are traveling by plane within a few weeks after your surgery, make sure you get up and walk every hour. Be sure to stretch your legs, drink plenty of liquids, and keep your feet elevated when possible. Will I need any supplies? Most people do not need any supplies after the surgery. In the rare case that you do need supplies, such as tubes or drains, your nurse will order them for you. When can I return to work? The time it takes to return to work depends on the type of work you do, the type of surgery you had, and how fast your body heals. Most people can return to work about 2 to 4 weeks after the surgery. What exercises can I do? Exercise will help you gain strength and feel better. Walking and stair climbing are excellent forms of exercise. Gradually increase the distance you  walk. Climb stairs slowly, resting or stopping as needed. Ask your doctor or nurse before starting more strenuous exercises. When can I lift heavy objects? Most people should not lift anything heavier than 10 pounds (4.5 kilograms) for at least 4 weeks after surgery. Speak with your doctor about when you can do heavy lifting. How can I cope with  my feelings? After surgery for a serious illness, you may have new and upsetting feelings. Many people say they felt weepy, sad, worried, nervous, irritable, and angry at one time or another. You may find that you can't control some of these feelings. If this happens, it's a Tylerjames Hoglund idea to seek emotional support. The first step in coping is to talk about how you feel. Family and friends can help. Your nurse, doctor, and social worker can reassure, support, and guide you. It's always a Garold Sheeler idea to let these professionals know how you, your family, and your friends are feeling emotionally. Many resources are available to patients and their families. Whether you're in the hospital or at home, the nurses, doctors, and social workers are here to help you and your family and friends handle the emotional aspects of your illness. When is my first appointment after surgery? Your first appointment after surgery will be 2 to 4 weeks after surgery. Your nurse will give you instructions on how to make this appointment, including the phone number to call. What if I have other questions? If you have any questions or concerns, please talk with your doctor or nurse. You can reach them Monday through Friday from 9:00 am to 5:00 pm. After 5:00 pm, during the weekend, and on holidays, call [phone] and ask for the doctor on call for your doctor.   Have a temperature of 101 F (38.3 C) or higher  Have pain that does not get better with pain medication  Have redness, drainage, or swelling from your incisions   General Anesthesia, Adult, Care After This sheet gives you  information about how to care for yourself after your procedure. Your health care provider may also give you more specific instructions. If you have problems or questions, contact your health care provider. What can I expect after the procedure? After the procedure, the following side effects are common:  Pain or discomfort at the IV site.  Nausea.  Vomiting.  Sore throat.  Trouble concentrating.  Feeling cold or chills.  Weak or tired.  Sleepiness and fatigue.  Soreness and body aches. These side effects can affect parts of the body that were not involved in surgery. Follow these instructions at home:  For at least 24 hours after the procedure:  Have a responsible adult stay with you. It is important to have someone help care for you until you are awake and alert.  Rest as needed.  Do not: ? Participate in activities in which you could fall or become injured. ? Drive. ? Use heavy machinery. ? Drink alcohol. ? Take sleeping pills or medicines that cause drowsiness. ? Make important decisions or sign legal documents. ? Take care of children on your own. Eating and drinking  Follow any instructions from your health care provider about eating or drinking restrictions.  When you feel hungry, start by eating small amounts of foods that are soft and easy to digest (bland), such as toast. Gradually return to your regular diet.  Drink enough fluid to keep your urine pale yellow.  If you vomit, rehydrate by drinking water, juice, or clear broth. General instructions  If you have sleep apnea, surgery and certain medicines can increase your risk for breathing problems. Follow instructions from your health care provider about wearing your sleep device: ? Anytime you are sleeping, including during daytime naps. ? While taking prescription pain medicines, sleeping medicines, or medicines that make you drowsy.  Return to your normal activities as told by  your health care  provider. Ask your health care provider what activities are safe for you.  Take over-the-counter and prescription medicines only as told by your health care provider.  If you smoke, do not smoke without supervision.  Keep all follow-up visits as told by your health care provider. This is important. Contact a health care provider if:  You have nausea or vomiting that does not get better with medicine.  You cannot eat or drink without vomiting.  You have pain that does not get better with medicine.  You are unable to pass urine.  You develop a skin rash.  You have a fever.  You have redness around your IV site that gets worse. Get help right away if:  You have difficulty breathing.  You have chest pain.  You have blood in your urine or stool, or you vomit blood. Summary  After the procedure, it is common to have a sore throat or nausea. It is also common to feel tired.  Have a responsible adult stay with you for the first 24 hours after general anesthesia. It is important to have someone help care for you until you are awake and alert.  When you feel hungry, start by eating small amounts of foods that are soft and easy to digest (bland), such as toast. Gradually return to your regular diet.  Drink enough fluid to keep your urine pale yellow.  Return to your normal activities as told by your health care provider. Ask your health care provider what activities are safe for you. This information is not intended to replace advice given to you by your health care provider. Make sure you discuss any questions you have with your health care provider. Document Released: 03/19/2001 Document Revised: 12/14/2017 Document Reviewed: 07/27/2017 Elsevier Patient Education  2020 [employer].

## 2019-07-23 ENCOUNTER — Telehealth: Payer: Self-pay

## 2019-07-23 ENCOUNTER — Encounter (HOSPITAL_COMMUNITY): Payer: Self-pay | Admitting: Gynecologic Oncology

## 2019-07-23 NOTE — Telephone Encounter (Signed)
Jamie Labarre is feeling better today. The gas pain has diminished. She is eating and drinking well. Passing gas.   She will get the Senokot-S today.  That prescription was not given to them from the pharmacy. She is experiencing lower abdominal pressure when she urinates. Told her to call tomorrow or Friday am if pressure persists to have her urine checked for possible infection.  Gave [name]'s Patient experience number [phone] to call about her post op experience. She said the nurse was rude and hurried her up. She has had several surgeries and has never had a bad experience as she did yesterday. Will inform Dr. Denman George and [name], NP of her poor experience postoperatively.

## 2019-07-24 ENCOUNTER — Other Ambulatory Visit: Payer: Self-pay | Admitting: Gynecologic Oncology

## 2019-07-24 ENCOUNTER — Telehealth: Payer: Self-pay

## 2019-07-24 DIAGNOSIS — Z1501 Genetic susceptibility to malignant neoplasm of breast: Secondary | ICD-10-CM

## 2019-07-24 MED ORDER — OXYCODONE HCL 5 MG PO TABS: 5.0000 mg | ORAL_TABLET | ORAL | 0 refills | Status: DC | PRN

## 2019-07-24 NOTE — Progress Notes (Signed)
See RN note. Patient called asking for refill on pain medication.  Having moderate pain related to gas from surgery.

## 2019-07-24 NOTE — Telephone Encounter (Signed)
[name] states that she has 3 Oxycodone tablets left. She is using 1 q 4 hours alt with Ibuprofen and tylenol.  She needed to use 2 tablets the first 24-to 36 hours for the gas pain. She is intermittently experiencing the gas pain as well as abdominal abdominal pain from the surgery. [name], NP sent in 20 more tablets to her pharmacy. Discussed increasing the Senokot-S to two tabs bid to tid.  Adding Miralax 1 capful Bid if no BM today and adding 1/2 a bottle of Mag Citrate with repeat in 3 hrs if no BM Friday or Sat.  Told Jamie Treu that her final pathology was benign.

## 2019-08-15 ENCOUNTER — Inpatient Hospital Stay: Payer: BC Managed Care – PPO | Attending: Hematology and Oncology

## 2019-08-15 ENCOUNTER — Telehealth: Payer: Self-pay

## 2019-08-15 ENCOUNTER — Other Ambulatory Visit: Payer: Self-pay | Admitting: Gynecologic Oncology

## 2019-08-15 ENCOUNTER — Other Ambulatory Visit: Payer: Self-pay

## 2019-08-15 DIAGNOSIS — R1030 Lower abdominal pain, unspecified: Secondary | ICD-10-CM | POA: Insufficient documentation

## 2019-08-15 DIAGNOSIS — Z803 Family history of malignant neoplasm of breast: Secondary | ICD-10-CM | POA: Insufficient documentation

## 2019-08-15 DIAGNOSIS — Z1502 Genetic susceptibility to malignant neoplasm of ovary: Secondary | ICD-10-CM | POA: Insufficient documentation

## 2019-08-15 DIAGNOSIS — C50211 Malignant neoplasm of upper-inner quadrant of right female breast: Secondary | ICD-10-CM | POA: Insufficient documentation

## 2019-08-15 DIAGNOSIS — Z90722 Acquired absence of ovaries, bilateral: Secondary | ICD-10-CM | POA: Insufficient documentation

## 2019-08-15 DIAGNOSIS — Z9013 Acquired absence of bilateral breasts and nipples: Secondary | ICD-10-CM | POA: Diagnosis not present

## 2019-08-15 DIAGNOSIS — Z9221 Personal history of antineoplastic chemotherapy: Secondary | ICD-10-CM | POA: Diagnosis not present

## 2019-08-15 DIAGNOSIS — R300 Dysuria: Secondary | ICD-10-CM | POA: Diagnosis not present

## 2019-08-15 DIAGNOSIS — Z90710 Acquired absence of both cervix and uterus: Secondary | ICD-10-CM | POA: Insufficient documentation

## 2019-08-15 DIAGNOSIS — Z87891 Personal history of nicotine dependence: Secondary | ICD-10-CM | POA: Insufficient documentation

## 2019-08-15 DIAGNOSIS — N951 Menopausal and female climacteric states: Secondary | ICD-10-CM | POA: Diagnosis not present

## 2019-08-15 LAB — URINALYSIS, COMPLETE (UACMP) WITH MICROSCOPIC
Bacteria, UA: NONE SEEN
Bilirubin Urine: NEGATIVE
Glucose, UA: NEGATIVE mg/dL
Hgb urine dipstick: NEGATIVE
Ketones, ur: NEGATIVE mg/dL
Nitrite: NEGATIVE
Protein, ur: NEGATIVE mg/dL
Specific Gravity, Urine: 1.014 (ref 1.005–1.030)
pH: 6 (ref 5.0–8.0)

## 2019-08-15 MED ORDER — NITROFURANTOIN MONOHYD MACRO 100 MG PO CAPS: 100.0000 mg | ORAL_CAPSULE | Freq: Two times a day (BID) | ORAL | 0 refills | Status: DC

## 2019-08-15 NOTE — Progress Notes (Signed)
See RN note. UA and culture to be taken today prior to starting Macrobid.

## 2019-08-15 NOTE — Telephone Encounter (Signed)
Patient called with symptoms of urinary urgency, pressure and pain.. Pt also experiencing lower abdominal pressure. Per [name], NP. Pt to come to lab for a urinanalysis and culture. Melissa will prescribe macrobid. Pt instructed not to start macrobid before providing urine specimen. Pt also told antibiotic may need to be changed after culture results available. Pt verbalized understanding. Pt scheduled for lab appointment this pm.

## 2019-08-15 NOTE — Telephone Encounter (Signed)
Pt called requesting urinalysis results. Given to patient . Patient to continue macrobid. We will call with culture results when they are available. Pt verbalized understanding

## 2019-08-16 LAB — URINE CULTURE: Culture: <10000 — AB

## 2019-08-18 ENCOUNTER — Other Ambulatory Visit: Payer: Self-pay | Admitting: Gynecologic Oncology

## 2019-08-18 ENCOUNTER — Telehealth: Payer: Self-pay

## 2019-08-18 DIAGNOSIS — R1030 Lower abdominal pain, unspecified: Secondary | ICD-10-CM

## 2019-08-18 NOTE — Telephone Encounter (Signed)
I spoke with [name] this am. I told her that the urine culture did not indicate a urinary tract infection and she can stop taking the Macrobid. She states that she is still experiencing intermittan lower abominal pain.  She states it si the same pain as before surgery. She state she feels bloated. She denies n/v/c/d. I told her I would relate this to [name], NP and we would follow up with her.

## 2019-08-18 NOTE — Telephone Encounter (Signed)
I spoke with [name] this afternoon.  I told her that Dr. Denman George has ordered a CT of the abd and pelvis. I told her the date and time as well as instructions for the oral contrast. Pt will come to this office to pick up oral contrast. Pt verbalized understanding.

## 2019-08-18 NOTE — Progress Notes (Signed)
See RN note.  Patient with symptoms of urinary urgency and pressure with negative urine culture, lower abd pain/pressure, and nausea.  Plan for CT scan per Dr. Denman George to evaluate for pelvic fluid collection or other cause of above symptoms.

## 2019-08-22 ENCOUNTER — Inpatient Hospital Stay (HOSPITAL_BASED_OUTPATIENT_CLINIC_OR_DEPARTMENT_OTHER): Payer: BC Managed Care – PPO | Admitting: Gynecologic Oncology

## 2019-08-22 ENCOUNTER — Ambulatory Visit (HOSPITAL_COMMUNITY)
Admission: RE | Admit: 2019-08-22 | Discharge: 2019-08-22 | Disposition: A | Discharge status: Home / Self Care | Payer: BC Managed Care – PPO | Source: Ambulatory Visit | Attending: Gynecologic Oncology | Admitting: Gynecologic Oncology

## 2019-08-22 ENCOUNTER — Other Ambulatory Visit: Payer: Self-pay

## 2019-08-22 ENCOUNTER — Ambulatory Visit (HOSPITAL_COMMUNITY): Admission: RE | Admit: 2019-08-22 | Payer: BC Managed Care – PPO | Source: Ambulatory Visit

## 2019-08-22 ENCOUNTER — Encounter: Payer: Self-pay | Admitting: Gynecologic Oncology

## 2019-08-22 VITALS — BP 111/70 | HR 84 | Temp 98.5°F | Resp 18 | Ht 60.0 in | Wt 133.0 lb

## 2019-08-22 DIAGNOSIS — R109 Unspecified abdominal pain: Secondary | ICD-10-CM | POA: Diagnosis not present

## 2019-08-22 DIAGNOSIS — Z1501 Genetic susceptibility to malignant neoplasm of breast: Secondary | ICD-10-CM

## 2019-08-22 DIAGNOSIS — R1030 Lower abdominal pain, unspecified: Secondary | ICD-10-CM | POA: Insufficient documentation

## 2019-08-22 DIAGNOSIS — Z90710 Acquired absence of both cervix and uterus: Secondary | ICD-10-CM | POA: Diagnosis not present

## 2019-08-22 DIAGNOSIS — Z90722 Acquired absence of ovaries, bilateral: Secondary | ICD-10-CM | POA: Diagnosis not present

## 2019-08-22 DIAGNOSIS — Z803 Family history of malignant neoplasm of breast: Secondary | ICD-10-CM | POA: Diagnosis not present

## 2019-08-22 DIAGNOSIS — R300 Dysuria: Secondary | ICD-10-CM

## 2019-08-22 DIAGNOSIS — C50211 Malignant neoplasm of upper-inner quadrant of right female breast: Secondary | ICD-10-CM | POA: Diagnosis not present

## 2019-08-22 DIAGNOSIS — Z9221 Personal history of antineoplastic chemotherapy: Secondary | ICD-10-CM | POA: Diagnosis not present

## 2019-08-22 DIAGNOSIS — Z87891 Personal history of nicotine dependence: Secondary | ICD-10-CM | POA: Diagnosis not present

## 2019-08-22 DIAGNOSIS — N951 Menopausal and female climacteric states: Secondary | ICD-10-CM | POA: Diagnosis not present

## 2019-08-22 DIAGNOSIS — Z1502 Genetic susceptibility to malignant neoplasm of ovary: Secondary | ICD-10-CM | POA: Diagnosis not present

## 2019-08-22 DIAGNOSIS — Z1509 Genetic susceptibility to other malignant neoplasm: Secondary | ICD-10-CM

## 2019-08-22 DIAGNOSIS — Z9013 Acquired absence of bilateral breasts and nipples: Secondary | ICD-10-CM | POA: Diagnosis not present

## 2019-08-22 MED ORDER — SODIUM CHLORIDE (PF) 0.9 % IJ SOLN
INTRAMUSCULAR | Status: AC
  Filled 2019-08-22: qty 50

## 2019-08-22 MED ORDER — IOHEXOL 300 MG/ML  SOLN
30.0000 mL | Freq: Once | INTRAMUSCULAR | Status: AC | PRN
  Administered 2019-08-22: 12:00:00 30 mL via ORAL

## 2019-08-22 MED ORDER — ESTROGENS CONJUGATED 0.625 MG PO TABS: 0.6250 mg | ORAL_TABLET | Freq: Every day | ORAL | 11 refills | Status: DC

## 2019-08-22 MED ORDER — IOHEXOL 300 MG/ML  SOLN
100.0000 mL | Freq: Once | INTRAMUSCULAR | Status: AC | PRN
  Administered 2019-08-22: 14:00:00 100 mL via INTRAVENOUS

## 2019-08-22 MED ORDER — PHENAZOPYRIDINE HCL 100 MG PO TABS: 100.0000 mg | ORAL_TABLET | Freq: Three times a day (TID) | ORAL | 0 refills | Status: DC | PRN

## 2019-08-22 NOTE — Patient Instructions (Signed)
Dr Denman George has prescribed pyridium to take up to 3 times a day as needed for up to 6 days to see if this helps your bladder discomfort.  She has prescribed an estrogen tablet to take once per day for hot flashes.  If your pelvic pain continues for 2 months after surgery, please notify her office and they will schedule you to see a pelvic physical therapist.

## 2019-08-22 NOTE — Progress Notes (Signed)
Follow-up Note: Gyn-Onc  Consult was initially requested by Dr. Lindi Adie for the evaluation of [name] 33 y.o. female  CC:  Chief Complaint  Patient presents with  . hysterectomy    follow-up  . Pelvic Pain  . Hot Flashes    surgical menopause symptoms    Assessment/Plan:  Ms. [name]  is a 33 y.o.  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 Despite an uncomplicated surgical procedure and normal postoperative CT scan, the patient has symptoms of pelvic pressure and pain.  I cannot find an organic source for these.  Given the dominance of symptoms surrounding micturition, we will try a course of Pyridium.  I explained to her that if she continues to have the symptoms that are bothersome at a 28-monthpoint postoperatively, we will refer her to pelvic physical therapy for assessment.  I have prescribed her Premarin oral tablet to take daily for symptoms of surgical menopause.  We discussed that DWaunita Schoonerdoes not show that this is associated with increased risk of recurrence for patients with triple negative breast cancer and of note the patient has had a risk reducing bilateral mastectomy.  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  Patient was diagnosed with breast cancer in February 2019.  This is been treated to date with neoadjuvant chemotherapy which she has completed on July 08, 2018.  She tolerated therapy fairly well.  The patient has a strong family history for breast cancer including both aunts who noted to be BRCA1 positive.  She has no known family history for ovarian cancer.  Her aunts are on the paternal side.  She does not have a relationship with her father.  She has siblings who have not been tested and have no history of cancer.  She underwent genetic testing herself on  February 20, 2018.  This revealed a deleterious  mutation in BRCA1 as well as variance of uncertain significance identified in MLH1 and RAD51C.  She is planning to undergo definitive risk reducing bilateral mastectomies and reconstruction, ideally in this coming month with Dr. WDonne Hazel  Patient's gynecologic history is significant for amenorrhea since March 2019 when she commenced chemotherapy.  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  During that time she was diagnosed with endometriosis.  She underwent Clomid therapy and conceived twins spontaneously in 2014.  Prior to that she had a delivery of full-term infant approximately 134years ago via cesarean section.  Her twins were also delivered via cesarean section.  She is a history of a tubal ligation was performed at the time of cesarean cesarean section.  Her only other prior surgeries were laparoscopies performed at the time of infertility treatment.  She has no prior history of abnormal Pap smears.  Her family history is significant for a paternal aunt who developed breast cancer in her 411sand was noted to be BRCA 1 germline positive.  An alternative paternal aunt also died of breast cancer in her 39sand was also BRCA1 positive.  A maternal aunt has a history of colon cancer in her 39s  A maternal uncle died from lung cancer, he was a smoker.  CT scan in February, 2019 showed normal ovaries and uterus. CA 125  on 07/08/18 was normal at 9.4. CA 125 pm 02/13/19 was normal at 15.7.   She underwent breast reconstructive surgery in November, 2019. She requires a revision of one of the reconstruction sites and this is planned for April.   She desires risk reducing hysterectomy, BSO in May late 2020.   TVUS on 02/13/19 showed a 9.5cm uterus with blood clot within the cavity. Ovaries bilaterally were small and normal.    Interval Hx:  On July 14, 2019 she underwent a robotic assisted  risk reducing total hysterectomy BSO.  Final pathology revealed benign pathology in the uterus, tubes and ovaries.  Surgery was uncomplicated.  Postoperatively she developed pelvic fullness worse with sitting and pelvic pressure that was worse with micturition.  A postoperative urine specimen was negative for urinary tract infection (a preoperative specimen had shown a urinary tract infection but this has been adequately treated pre-operatively).  She received a CT scan of the abdomen pelvis on August 22, 2019 to evaluate for possible postoperative complications.  This showed no evidence of fluid collections, seromas, abscesses, or potential causes for pain.  The bladder appeared within normal limits.  She reports very bothersome vasomotor symptoms of surgical menopause which are quality of life limiting.  Discussions with her medical oncologist, Dr. Lindi Adie, confirms that he was accepting of her taking postoperative estrogen replacement therapy for menopausal symptoms.  Current Meds:  Outpatient Encounter Medications as of 08/22/2019  Medication Sig  . estrogens, conjugated, (PREMARIN) 0.625 MG tablet Take 1 tablet (0.625 mg total) by mouth daily. Take daily for 21 days then do not take for 7 days.  Marland Kitchen ibuprofen (ADVIL) 800 MG tablet Take 1 tablet (800 mg total) by mouth every 8 (eight) hours as needed for moderate pain. For AFTER surgery  . phenazopyridine (PYRIDIUM) 100 MG tablet Take 1 tablet (100 mg total) by mouth 3 (three) times daily as needed for pain.  Marland Kitchen Potassium 99 MG TABS Take 99 mg by mouth daily.  . vitamin B-12 (CYANOCOBALAMIN) 500 MCG tablet Take 500 mcg by mouth daily.  . [DISCONTINUED] nitrofurantoin, macrocrystal-monohydrate, (MACROBID) 100 MG capsule Take 1 capsule (100 mg total) by mouth 2 (two) times daily.  . [DISCONTINUED] oxyCODONE (OXY IR/ROXICODONE) 5 MG immediate release tablet Take 1 tablet (5 mg total) by mouth every 4 (four) hours as needed for severe pain. For AFTER  surgery, do not take and drive  . [DISCONTINUED] senna-docusate (SENOKOT-S) 8.6-50 MG tablet Take 2 tablets by mouth at bedtime. For AFTER surgery, do not take if having diarrhea   Facility-Administered Encounter Medications as of 08/22/2019  Medication  . sodium chloride (PF) 0.9 % injection    Allergy:  Allergies  Allergen Reactions  . Lidocaine Other (See Comments)    NUMBNESS AND TINGLING ARMS AND LEGS    Social Hx:   Social History   Socioeconomic History  . Marital status: Married    Spouse name: Not on file  . Number of children: Not on file  . Years of education: Not on file  . Highest education level: Not on file  Occupational History  . Not on file  Social Needs  . Financial resource strain: Not on file  . Food insecurity    Worry: Not on file    Inability: Not on file  . Transportation needs    Medical: Not on file    Non-medical: Not on file  Tobacco Use  . Smoking status: Former Smoker    Quit date: 12/25/2003  Years since quitting: 15.6  . Smokeless tobacco: Never Used  Substance and Sexual Activity  . Alcohol use: No  . Drug use: No  . Sexual activity: Not Currently  Lifestyle  . Physical activity    Days per week: Not on file    Minutes per session: Not on file  . Stress: Not on file  Relationships  . Social Herbalist on phone: Not on file    Gets together: Not on file    Attends religious service: Not on file    Active member of club or organization: Not on file    Attends meetings of clubs or organizations: Not on file    Relationship status: Not on file  . Intimate partner violence    Fear of current or ex partner: Not on file    Emotionally abused: Not on file    Physically abused: Not on file    Forced sexual activity: Not on file  Other Topics Concern  . Not on file  Social History Narrative  . Not on file    Past Surgical Hx:  Past Surgical History:  Procedure Laterality Date  . BREAST RECONSTRUCTION WITH  PLACEMENT OF TISSUE EXPANDER AND ALLODERM Bilateral 07/29/2018   Procedure: BILATERAL BREAST RECONSTRUCTION WITH PLACEMENT OF TISSUE EXPANDER AND ALLODERM;  Surgeon: Irene Limbo, MD;  Location: [facility];  Service: Plastics;  Laterality: Bilateral;  . CESAREAN SECTION    . CESAREAN SECTION WITH BILATERAL TUBAL LIGATION Bilateral 11/19/2013   Procedure: REPEAT CESAREAN SECTION WITH BILATERAL TUBAL LIGATION; TWINS;  Surgeon: Lovenia Kim, MD;  Location: [facility];  Service: Obstetrics;  Laterality: Bilateral;  EDD: 12/09/13  . CHOLECYSTECTOMY    . PORTA CATH REMOVAL Right 11/19/2018   Procedure: PORTA CATH REMOVAL;  Surgeon: Irene Limbo, MD;  [facility];  Service: Plastics;  Laterality: Right;  . PORTACATH PLACEMENT N/A 02/20/2018   Procedure: INSERTION PORT-A-CATH WITH ULTRASOUND;  Surgeon: Rolm Bookbinder, MD;  Location: [place];  Service: General;  Laterality: N/A;  . REMOVAL OF BILATERAL TISSUE EXPANDERS WITH PLACEMENT OF BILATERAL BREAST IMPLANTS Bilateral 11/19/2018   Procedure: REMOVAL OF BILATERAL TISSUE EXPANDERS WITH PLACEMENT OF BILATERAL BREAST IMPLANTS;  Surgeon: Irene Limbo, MD;  Location: [facility];  Service: Plastics;  Laterality: Bilateral;  . ROBOTIC ASSISTED TOTAL HYSTERECTOMY WITH BILATERAL SALPINGO OOPHERECTOMY Bilateral 07/22/2019   Procedure: XI ROBOTIC ASSISTED TOTAL HYSTERECTOMY WITH BILATERAL SALPINGO OOPHORECTOMY WITH UMBILICAL HERNIA REPAIR;  Surgeon: Everitt Amber, MD;  Location: [facility];  Service: Gynecology;  Laterality: Bilateral;    Past Medical Hx:  Past Medical History:  Diagnosis Date  . Cluster headaches   . Complication of anesthesia    severe hypotension to the point of syncope  . Family history of breast cancer   . Family history of colon cancer   . Family history of stomach cancer   . History of breast cancer 01/2018   right  . History of chemotherapy     finished 07/05/2018  . History of palpitations    due to low K+  . Hyperthyroidism    no current med.  . Infection of left breast 11/11/2018   to start antibiotic 11/11/2018  . Irritable bowel syndrome (IBS)    no current med.    Past Gynecological History:  G2P2003 Patient's last menstrual period was 07/22/2019.  Family Hx:  Family History  Problem Relation Age of Onset  . Cirrhosis Father   .  Breast cancer Paternal Aunt 51       BRCA1 [id number] pos  . Heart disease Maternal Grandfather        Heart attack  . Breast cancer Paternal Aunt        dx in her 72s  . Colon cancer Maternal Aunt        dx in her 44s  . Lupus Sister        pat 1/2 sister  . Other Brother 48       pat 1/2 brother died in [place]  . Lung cancer Maternal Uncle        smoker  . Other Paternal Aunt        died in a MVA  . Other Paternal Aunt        died from poisioning    Review of Systems:  Constitutional  Feels well,    ENT Normal appearing ears and nares bilaterally Skin/Breast  No rash, sores, jaundice, itching, dryness Cardiovascular  No chest pain, shortness of breath, or edema  Pulmonary  No cough or wheeze.  Gastro Intestinal  No nausea, vomitting, or diarrhoea. No bright red blood per rectum, no abdominal pain, change in bowel movement, or constipation.  Genito Urinary  No frequency, urgency, dysuria, no bleeding, amenorrhic Musculo Skeletal  No myalgia, arthralgia, joint swelling or pain  Neurologic  No weakness, numbness, change in gait,  Psychology  No depression, anxiety, insomnia.   Vitals:  Blood pressure 111/70, pulse 84, temperature 98.5 F (36.9 C), temperature source Tympanic, resp. rate 18, height 5' (1.524 m), weight 133 lb (60.3 kg), last menstrual period 07/22/2019, SpO2 100 %.  Physical Exam: WD in NAD Neck  Supple NROM, without any enlargements.  Lymph Node Survey No cervical supraclavicular or inguinal adenopathy Cardiovascular  Pulse normal rate, regularity  and rhythm. S1 and S2 normal.  Lungs  Clear to auscultation bilateraly, without wheezes/crackles/rhonchi. Good air movement.  Skin  No rash/lesions/breakdown  Psychiatry  Alert and oriented to person, place, and time  Abdomen  Normoactive bowel sounds, abdomen soft, non-tender and thin without evidence of hernia.  Back No CVA tenderness Genito Urinary  Vaginal cuff well healed, no masses. Tenderness overlying the bladder and some bladder fullness.  Rectal  deferred Extremities  No bilateral cyanosis, clubbing or edema.   Thereasa Solo, MD  08/22/2019, 5:15 PM

## 2019-09-22 DIAGNOSIS — R430 Anosmia: Secondary | ICD-10-CM | POA: Diagnosis not present

## 2019-09-22 DIAGNOSIS — R05 Cough: Secondary | ICD-10-CM | POA: Diagnosis not present

## 2019-09-22 DIAGNOSIS — R0981 Nasal congestion: Secondary | ICD-10-CM | POA: Diagnosis not present

## 2019-09-22 DIAGNOSIS — R509 Fever, unspecified: Secondary | ICD-10-CM | POA: Diagnosis not present

## 2019-09-22 DIAGNOSIS — J329 Chronic sinusitis, unspecified: Secondary | ICD-10-CM | POA: Diagnosis not present

## 2019-10-15 ENCOUNTER — Other Ambulatory Visit: Payer: Self-pay | Admitting: Gynecologic Oncology

## 2019-10-15 DIAGNOSIS — Z1501 Genetic susceptibility to malignant neoplasm of breast: Secondary | ICD-10-CM

## 2019-10-15 DIAGNOSIS — E2839 Other primary ovarian failure: Secondary | ICD-10-CM

## 2019-10-15 DIAGNOSIS — Z1509 Genetic susceptibility to other malignant neoplasm: Secondary | ICD-10-CM

## 2019-10-15 MED ORDER — ESTRADIOL 0.1 MG/24HR TD PTWK: 0.1000 mg | MEDICATED_PATCH | TRANSDERMAL | 6 refills | Status: DC

## 2019-10-16 ENCOUNTER — Telehealth: Payer: Self-pay

## 2019-10-16 NOTE — Telephone Encounter (Signed)
[name] states that the premarin tablets are not effective with her hot flashes. She is waking up drenched at night. She does forget to take pill on occasion so she is interested in having a patch prescribed.

## 2019-10-16 NOTE — Telephone Encounter (Signed)
LM for [name] that Jamie John, NP sent in estradiol Patch 0.1 mg/24 hr. To her pharmacy.  This is a slightly higher dose.   She can call back to the office in ~ months if the patch is not effective with hot flashes. Melissa suggests doing some abdominal exercises for the increased abdominal girth as the estradiol dose may not reduce the girth. Pt can call the office if she has any questions or concerns.

## 2019-10-24 ENCOUNTER — Telehealth: Payer: Self-pay | Admitting: *Deleted

## 2019-10-24 NOTE — Telephone Encounter (Signed)
[name] is not febrile. She is able to bear weight on legs with no pain. No swelling, redness,or hot spots on legs. Denies urinary symptoms. Pt experiencing increased vaginal discharge. Discharge is clear/ yellow at times. Denies and heavy lifting in the last couple of weeks  Covid test today was negative.  Reviewed with Jamie John, NP  Told [name] that the discharge is to be expected. She can use advil and tylenol to see if this helps the discomfort. She needs to monitor for increased covid symptoms as she could develop these up to 14 days from exposure even though she tested negative. [name] said that she could send in a lower dose patch if she would like. [name] stated that she will see how things go over the weekend and will call back Monday 10-27-19 with an up date.

## 2019-10-24 NOTE — Telephone Encounter (Signed)
Returned the patient's call from her voicemail message. Patient stated " I started the estradiol patch one week ago, I started having leg and back pain three days ago. I did a a direct exposure to covid. I found out Monday a teacher tested positive and then on Wednesday a student tested positive. I have been around them within the last 7-10 days. I have no other covid symptoms. I didn't know if this was related to the patch or covid." Message sent to [facility] APP

## 2019-10-28 NOTE — Telephone Encounter (Signed)
[name] states that her back pain is better however, she is experiencing breast pain, vaginal discharge(less then last week),and leg aches/cramping. Her legs in the past have ached when she ovulated.  She also experiences cramps when her potassium is low.  She resumed supplements this weekend but legs remain achy.  She is not experiencing any skipping of heart beats. This occurs when her potassium is very low. Suggested that she see if her PCP will do chemistries to evaluate potassium level. She will call the potassium level to Dr. Denman Reyes when available. Pt will remove the estradiol patch now. Pt would like a pill to take this time.  She is interested in any medication other then estrogen since she has had breast cancer. Told [name] that [name] and Dr. Denman Reyes are in the [facility] today. Will relay this information to them for recommendations.

## 2019-10-30 ENCOUNTER — Telehealth: Payer: Self-pay

## 2019-10-30 DIAGNOSIS — N6312 Unspecified lump in the right breast, upper inner quadrant: Secondary | ICD-10-CM | POA: Diagnosis not present

## 2019-10-30 DIAGNOSIS — E876 Hypokalemia: Secondary | ICD-10-CM | POA: Diagnosis not present

## 2019-10-30 DIAGNOSIS — C50919 Malignant neoplasm of unspecified site of unspecified female breast: Secondary | ICD-10-CM | POA: Diagnosis not present

## 2019-10-30 DIAGNOSIS — R002 Palpitations: Secondary | ICD-10-CM | POA: Diagnosis not present

## 2019-10-30 NOTE — Telephone Encounter (Signed)
I spoke with [name] this pm.  I let her know that Dr. Serita Grit recommendation is that she follow up with Dr. Lindi Adie regarding her hot flashes s/p surgery.  [name] agreed with this.  I have relayed this information to Dr. Geralyn Flash nurse Kathlee Nations.  The patient said she was examined by her PCP for the bone pain and is following up with that physician for that.

## 2019-10-31 ENCOUNTER — Telehealth: Payer: Self-pay

## 2019-10-31 NOTE — Telephone Encounter (Signed)
RN spoke with patient to follow up after MD review for hot flashes.  MD recommendations to start Gabapentin 300mg  QHS.   Pt voiced she has tried this medication in the past, is still concerned with menopausal symptoms overall.  Pt had labs drawn by PCP on 10/30/2019.  Pt voiced she would like to see MD to review options and follow up from h/o breast cancer.  Pt declined Gabapentin at this time.  MD appointment made, patient will have labs faxed to office.

## 2019-11-02 NOTE — Progress Notes (Signed)
Patient Care Team: Sasser, Silvestre Moment, MD as PCP - General (Family Medicine) Irene Limbo, MD as Consulting Physician (Plastic Surgery) Gardenia Phlegm, NP as Nurse Practitioner (Hematology and Oncology) Nicholas Lose, MD as Consulting Physician (Hematology and Oncology) Rolm Bookbinder, MD as Consulting Physician (General Surgery)  DIAGNOSIS:    ICD-10-CM   1. Malignant neoplasm of upper-inner quadrant of right breast in female, estrogen receptor negative ([place])  C50.211    Z17.1     SUMMARY OF ONCOLOGIC HISTORY: Oncology History  Malignant neoplasm of upper-inner quadrant of right breast in female, estrogen receptor negative ([place])  02/01/2018 Initial Diagnosis   Screening detected right breast mass in the upper inner quadrant measuring 1.5 cm, with additional enhancement it measured 2.2 cm.  No axillary lymph nodes.  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  The Hereditary Gene Panel offered by [facility] includes sequencing and/or deletion duplication testing of the following 47 genes: APC, ATM, AXIN2, BARD1, BMPR1A, BRCA1, BRCA2, BRIP1, CDH1, CDK4, CDKN2A (p14ARF), CDKN2A (p16INK4a), CHEK2, CTNNA1, DICER1, EPCAM (Deletion/duplication testing only), GREM1 (promoter region deletion/duplication testing only), KIT, MEN1, MLH1, MSH2, MSH3, MSH6, MUTYH, NBN, NF1, NHTL1, PALB2, PDGFRA, PMS2, POLD1, POLE, PTEN, RAD50, RAD51C, RAD51D, SDHB, SDHC, SDHD, SMAD4, SMARCA4. STK11, TP53, TSC1, TSC2, and VHL.  The following genes were evaluated for sequence changes only: SDHA and HOXB13 c.251G>A variant only. The report date is February 20, 2018.    02/20/2018 - 07/05/2018 Neo-Adjuvant Chemotherapy   Neoadjuvant dose dense Adriamycin and Cytoxan x4 followed by Taxol and  carboplatin (received 8 cycles of Taxol and 3 cycles of carboplatin)   07/29/2018 Surgery   Bilateral mastectomies: Right mastectomy benign, 0/6 lymph nodes; left mastectomy: Benign, pathologic complete response   07/22/2019 Surgery   Total hysterectomy and bilateral salpingoophorectomy     CHIEF COMPLIANT: Follow-up of triple negative right breast cancer  INTERVAL HISTORY: [name] is a 33 y.o. with above-mentioned history of triple negative right breast cancer who completed neoadjuvant chemotherapy and underwent bilateral mastectomies with reconstruction. I last saw her a year ago, and in the interim she was seen in survivorship clinic by [name], NP. She underwent a total hysterectomy and bilateral salpingoophorectomy on 07/22/19. She presents to the clinic today for follow-up.  Complaining of diffuse aches and pains in the muscles as well as not feeling well and lack of energy.  This has been getting worse over the past couple of months.  She had a CT of the abdomen which did not show any evidence of metastatic disease.  She is also experiencing some discomfort in the left breast implant.  REVIEW OF SYSTEMS:   Constitutional: Denies fevers, chills or abnormal weight loss Eyes: Denies blurriness of vision Ears, nose, mouth, throat, and face: Denies mucositis or sore throat Respiratory: Denies cough, dyspnea or wheezes Cardiovascular: Denies palpitation, chest discomfort Gastrointestinal: Denies nausea, heartburn or change in bowel habits Skin: Denies abnormal skin rashes Lymphatics: Denies new lymphadenopathy or easy bruising Neurological: Denies numbness, tingling or new weaknesses Behavioral/Psych: Mood is stable, no new changes  Extremities: No lower extremity edema Breast: Bilateral mastectomies with reconstruction All other systems were reviewed with the patient and are negative.  I have reviewed the past medical history, past surgical history, social history and  family history  with the patient and they are unchanged from previous note.  ALLERGIES:  is allergic to lidocaine.  MEDICATIONS:  Current Outpatient Medications  Medication Sig Dispense Refill  . estradiol (CLIMARA) 0.1 mg/24hr patch Place 1 patch (0.1 mg total) onto the skin once a week. 4 patch 6  . ibuprofen (ADVIL) 800 MG tablet Take 1 tablet (800 mg total) by mouth every 8 (eight) hours as needed for moderate pain. For AFTER surgery 30 tablet 0  . phenazopyridine (PYRIDIUM) 100 MG tablet Take 1 tablet (100 mg total) by mouth 3 (three) times daily as needed for pain. 20 tablet 0  . Potassium 99 MG TABS Take 99 mg by mouth daily.    . vitamin B-12 (CYANOCOBALAMIN) 500 MCG tablet Take 500 mcg by mouth daily.     No current facility-administered medications for this visit.     PHYSICAL EXAMINATION: ECOG PERFORMANCE STATUS: 1 - Symptomatic but completely ambulatory  Vitals:   11/03/19 1447  BP: 100/65  Pulse: 77  Resp: 20  Temp: 98.5 F (36.9 C)  SpO2: 100%   Filed Weights   11/03/19 1447  Weight: 132 lb 3.2 oz (60 kg)    GENERAL: alert, no distress and comfortable SKIN: skin color, texture, turgor are normal, no rashes or significant lesions EYES: normal, Conjunctiva are pink and non-injected, sclera clear OROPHARYNX: no exudate, no erythema and lips, buccal mucosa, and tongue normal  NECK: supple, thyroid normal size, non-tender, without nodularity LYMPH: no palpable lymphadenopathy in the cervical, axillary or inguinal LUNGS: clear to auscultation and percussion with normal breathing effort HEART: regular rate & rhythm and no murmurs and no lower extremity edema ABDOMEN: abdomen soft, non-tender and normal bowel sounds MUSCULOSKELETAL: no cyanosis of digits and no clubbing  NEURO: alert & oriented x 3 with fluent speech, no focal motor/sensory deficits EXTREMITIES: No lower extremity edema BREAST: No palpable masses or nodules in either right or left breasts. No  palpable axillary supraclavicular or infraclavicular adenopathy no breast tenderness or nipple discharge. (exam performed in the presence of a chaperone)  LABORATORY DATA:  I have reviewed the data as listed CMP Latest Ref Rng & Units 07/21/2019 02/13/2019 07/05/2018  Glucose 70 - 99 mg/dL 100(H) 80 101(H)  BUN 6 - 20 mg/dL _0 Creatinine 0.44 - 1.00 mg/dL 0.66 0.73 0.69  Sodium 135 - 145 mmol/L 140 141 141  Potassium 3.5 - 5.1 mmol/L 4.1 3.4(L) 3.6  Chloride 98 - 111 mmol/L 105 103 104  CO2 22 - 32 mmol/L _1 Calcium 8.9 - 10.3 mg/dL 9.5 9.5 9.9  Total Protein 6.5 - 8.1 g/dL - 7.8 7.3  Total Bilirubin 0.3 - 1.2 mg/dL - 0.3 0.4  Alkaline Phos 38 - 126 U/L - 74 67  AST 15 - 41 U/L - 12(L) 22  ALT 0 - 44 U/L - 8 33    Lab Results  Component Value Date   WBC 5.6 07/21/2019   HGB 13.1 07/21/2019   HCT 40.4 07/21/2019   MCV 90.8 07/21/2019   PLT 255 07/21/2019   NEUTROABS 3.9 02/13/2019    ASSESSMENT & PLAN:  Malignant neoplasm of upper-inner quadrant of right breast in female, estrogen receptor negative ([place]) 02/01/2018:Screening detected right breast mass in the upper inner quadrant measuring 1.5 cm, with additional enhancement it measured 2.2 cm. No axillary lymph nodes. 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  Genetic counseling and testing 2. neoadjuvant chemotherapy with dose dense Adriamycin and Cytoxan every 2 weeks x4 followed by Taxol weekly x12 (changed to Abraxane due to rash) accompanied by carboplatin every 3 weeks x4 completed 07/05/2018 3.Genetics negative  4.  07/29/2018: Bilateral mastectomies: Right mastectomy benign, 0/6 lymph nodes; left mastectomy: Benign, pathologic complete response --------------------------------------------------------------------------------------------------------------------------------- Palpable thickening involving the right breast: I  did not feel any lumps or nodules.  Diffuse aches and pains: CT abdomen 08/22/2019: Benign no evidence of malignancy. I will obtain CT chest and bone scan for restaging evaluation. I will call her with the results of the scans.  Surveillance: 1.  11/03/2019: Bilateral mastectomies with reconstruction.  Slight dimpling of the left breast implant upper outer quadrant. 2.  No role of imaging because she had bilateral mastectomies.  Severe fatigue and generalized muscle aches and pains: I encouraged her to take turmeric and tonic water at bedtime.  Also to get massage therapy.  She also cut down her carbohydrate intake and see if it helps. She is a Pharmacist, hospital at [employer].  Return to clinic in 1 year for follow-up   No orders of the defined types were placed in this encounter.  The patient has a good understanding of the overall plan. she agrees with it. she will call with any problems that may develop before the next visit here.  Nicholas Lose, MD 11/03/2019  Julious Oka Dorshimer am acting as scribe for Dr. Nicholas Lose.  I have reviewed the above documentation for accuracy and completeness, and I agree with the above.

## 2019-11-03 ENCOUNTER — Inpatient Hospital Stay: Payer: BC Managed Care – PPO | Attending: Hematology and Oncology | Admitting: Hematology and Oncology

## 2019-11-03 ENCOUNTER — Other Ambulatory Visit: Payer: Self-pay

## 2019-11-03 DIAGNOSIS — Z171 Estrogen receptor negative status [ER-]: Secondary | ICD-10-CM | POA: Insufficient documentation

## 2019-11-03 DIAGNOSIS — C50211 Malignant neoplasm of upper-inner quadrant of right female breast: Secondary | ICD-10-CM | POA: Insufficient documentation

## 2019-11-03 DIAGNOSIS — N644 Mastodynia: Secondary | ICD-10-CM | POA: Insufficient documentation

## 2019-11-03 DIAGNOSIS — Z90710 Acquired absence of both cervix and uterus: Secondary | ICD-10-CM | POA: Diagnosis not present

## 2019-11-03 DIAGNOSIS — Z90722 Acquired absence of ovaries, bilateral: Secondary | ICD-10-CM | POA: Insufficient documentation

## 2019-11-03 DIAGNOSIS — R5383 Other fatigue: Secondary | ICD-10-CM | POA: Insufficient documentation

## 2019-11-03 DIAGNOSIS — Z9013 Acquired absence of bilateral breasts and nipples: Secondary | ICD-10-CM | POA: Diagnosis not present

## 2019-11-03 DIAGNOSIS — M7918 Myalgia, other site: Secondary | ICD-10-CM | POA: Insufficient documentation

## 2019-11-03 NOTE — Assessment & Plan Note (Signed)
02/01/2018:Screening detected right breast mass in the upper inner quadrant measuring 1.5 cm, with additional enhancement it measured 2.2 cm. No axillary lymph nodes. 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 Genetic counseling and testing 2. neoadjuvant chemotherapy with dose dense Adriamycin and Cytoxan every 2 weeks x4 followed by Taxol weekly x12 (changed to Abraxane due to rash) accompanied by carboplatin every 3 weeks x4 completed 07/05/2018 3.Genetics negative  4.  07/29/2018: Bilateral mastectomies: Right mastectomy benign, 0/6 lymph nodes; left mastectomy: Benign, pathologic complete response --------------------------------------------------------------------------------------------------------------------------------- Palpable thickening involving the right breast: I did not feel any lumps or nodules. CT CAP 08/22/2019: Benign no evidence of malignancy.  Surveillance: 1.  11/03/2019: Benign 2.  No role of imaging because she had bilateral mastectomies.  Return to clinic in 1 year for follow-up

## 2019-11-04 ENCOUNTER — Other Ambulatory Visit: Payer: Self-pay | Admitting: Hematology and Oncology

## 2019-11-04 ENCOUNTER — Telehealth: Payer: Self-pay | Admitting: Hematology and Oncology

## 2019-11-04 ENCOUNTER — Telehealth: Payer: Self-pay | Admitting: *Deleted

## 2019-11-04 DIAGNOSIS — E2839 Other primary ovarian failure: Secondary | ICD-10-CM

## 2019-11-04 NOTE — Telephone Encounter (Signed)
Patient called and left a message saying "I saw Dr. Lindi Adie yesterday and was ok with me staying on the estrogen on the lowest dose." Per Melissa APP sent message to Dr. Lindi Adie since we have discharged the patient.

## 2019-11-04 NOTE — Telephone Encounter (Signed)
I talk with patient regarding schedule  

## 2019-11-07 ENCOUNTER — Telehealth: Payer: Self-pay

## 2019-11-07 MED ORDER — ESTROGENS CONJUGATED 0.625 MG PO TABS: 0.6250 mg | ORAL_TABLET | Freq: Every day | ORAL | 0 refills | Status: DC

## 2019-11-07 NOTE — Telephone Encounter (Signed)
Pt called requesting Estrogen tablets VS patch.    RN reviewed with MD.  Orders for Premarin 0.625 tablets X 1 daily given.  RN called patient, no answer, voicemail is full.  Will send mychart message for communication.  Rx sent to pharmacy.

## 2019-11-12 ENCOUNTER — Other Ambulatory Visit: Payer: Self-pay | Admitting: *Deleted

## 2019-11-12 MED ORDER — ESTROGENS CONJUGATED 0.625 MG PO TABS: 0.6250 mg | ORAL_TABLET | Freq: Every day | ORAL | 0 refills | Status: DC

## 2019-11-18 ENCOUNTER — Encounter (HOSPITAL_COMMUNITY): Payer: Self-pay

## 2019-11-18 ENCOUNTER — Other Ambulatory Visit: Payer: Self-pay

## 2019-11-18 ENCOUNTER — Encounter (HOSPITAL_COMMUNITY)
Admission: RE | Admit: 2019-11-18 | Discharge: 2019-11-18 | Disposition: A | Discharge status: Home / Self Care | Payer: BC Managed Care – PPO | Source: Ambulatory Visit | Attending: Hematology and Oncology | Admitting: Hematology and Oncology

## 2019-11-18 ENCOUNTER — Ambulatory Visit (HOSPITAL_COMMUNITY)
Admission: RE | Admit: 2019-11-18 | Discharge: 2019-11-18 | Disposition: A | Discharge status: Home / Self Care | Payer: BC Managed Care – PPO | Source: Ambulatory Visit | Attending: Hematology and Oncology | Admitting: Hematology and Oncology

## 2019-11-18 DIAGNOSIS — C50919 Malignant neoplasm of unspecified site of unspecified female breast: Secondary | ICD-10-CM | POA: Diagnosis not present

## 2019-11-18 DIAGNOSIS — Z171 Estrogen receptor negative status [ER-]: Secondary | ICD-10-CM | POA: Insufficient documentation

## 2019-11-18 DIAGNOSIS — C50211 Malignant neoplasm of upper-inner quadrant of right female breast: Secondary | ICD-10-CM | POA: Insufficient documentation

## 2019-11-18 DIAGNOSIS — R0781 Pleurodynia: Secondary | ICD-10-CM | POA: Diagnosis not present

## 2019-11-18 HISTORY — DX: Malignant (primary) neoplasm, unspecified: C80.1

## 2019-11-18 MED ORDER — IOHEXOL 300 MG/ML  SOLN
75.0000 mL | Freq: Once | INTRAMUSCULAR | Status: AC | PRN
  Administered 2019-11-18: 75 mL via INTRAVENOUS

## 2019-11-18 MED ORDER — SODIUM CHLORIDE (PF) 0.9 % IJ SOLN
INTRAMUSCULAR | Status: AC
  Filled 2019-11-18: qty 50

## 2019-11-18 MED ORDER — TECHNETIUM TC 99M MEDRONATE IV KIT
21.2000 | PACK | Freq: Once | INTRAVENOUS | Status: AC
  Administered 2019-11-18: 10:00:00 21.2 via INTRAVENOUS

## 2019-11-19 ENCOUNTER — Telehealth: Payer: Self-pay | Admitting: Hematology and Oncology

## 2019-11-19 ENCOUNTER — Telehealth: Payer: Self-pay | Admitting: *Deleted

## 2019-11-19 NOTE — Telephone Encounter (Signed)
Received call from pt stating Premarin is too expensive with her pharmacy despite using a savings card.  Pt states monthly cost is $150 and for a 3 month prescription it is $300.  Pt requesting a different prescription be called in.  RN to review with MD for recommendations.

## 2019-11-19 NOTE — Telephone Encounter (Signed)
I left a voicemail that the CT chest and bone scan were normal.

## 2019-11-21 ENCOUNTER — Other Ambulatory Visit: Payer: Self-pay | Admitting: Hematology and Oncology

## 2019-11-21 ENCOUNTER — Other Ambulatory Visit: Payer: Self-pay | Admitting: *Deleted

## 2019-11-21 MED ORDER — ESTRADIOL 0.025 MG/24HR TD PTWK: 0.0250 mg | MEDICATED_PATCH | TRANSDERMAL | 12 refills | Status: DC

## 2020-07-20 ENCOUNTER — Telehealth: Payer: Self-pay | Admitting: *Deleted

## 2020-07-20 DIAGNOSIS — Z171 Estrogen receptor negative status [ER-]: Secondary | ICD-10-CM

## 2020-07-20 NOTE — Telephone Encounter (Signed)
Received call from pt with complaint of severe lower back pain and severe hot flashes.  Pt states two months ago she was diagnosed with a UTI by her pcp and was prescribed p.o antibiotics. Pt concerned with history of breast cancer and would like to be seen by MD. Pt denies burning with urination and states the only symptoms she is experiencing is lower back pain with a stabbing sensation at times.  Apt scheduled and pt verbalized understanding.

## 2020-07-21 NOTE — Progress Notes (Signed)
Patient Care Team: Sasser, Silvestre Moment, MD as PCP - General (Family Medicine) Irene Limbo, MD as Consulting Physician (Plastic Surgery) Gardenia Phlegm, NP as Nurse Practitioner (Hematology and Oncology) Nicholas Lose, MD as Consulting Physician (Hematology and Oncology) Rolm Bookbinder, MD as Consulting Physician (General Surgery)  DIAGNOSIS:    ICD-10-CM   1. Malignant neoplasm of upper-inner quadrant of right breast in female, estrogen receptor negative ([place])  C50.211    Z17.1     SUMMARY OF ONCOLOGIC HISTORY: Oncology History  Malignant neoplasm of upper-inner quadrant of right breast in female, estrogen receptor negative ([place])  02/01/2018 Initial Diagnosis   Screening detected right breast mass in the upper inner quadrant measuring 1.5 cm, with additional enhancement it measured 2.2 cm.  No axillary lymph nodes.  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  The Hereditary Gene Panel offered by [facility] includes sequencing and/or deletion duplication testing of the following 47 genes: APC, ATM, AXIN2, BARD1, BMPR1A, BRCA1, BRCA2, BRIP1, CDH1, CDK4, CDKN2A (p14ARF), CDKN2A (p16INK4a), CHEK2, CTNNA1, DICER1, EPCAM (Deletion/duplication testing only), GREM1 (promoter region deletion/duplication testing only), KIT, MEN1, MLH1, MSH2, MSH3, MSH6, MUTYH, NBN, NF1, NHTL1, PALB2, PDGFRA, PMS2, POLD1, POLE, PTEN, RAD50, RAD51C, RAD51D, SDHB, SDHC, SDHD, SMAD4, SMARCA4. STK11, TP53, TSC1, TSC2, and VHL.  The following genes were evaluated for sequence changes only: SDHA and HOXB13 c.251G>A variant only. The report date is February 20, 2018.    02/20/2018 - 07/05/2018 Neo-Adjuvant Chemotherapy   Neoadjuvant dose dense Adriamycin and Cytoxan x4 followed by Taxol and  carboplatin (received 8 cycles of Taxol and 3 cycles of carboplatin)   07/29/2018 Surgery   Bilateral mastectomies: Right mastectomy benign, 0/6 lymph nodes; left mastectomy: Benign, pathologic complete response   07/22/2019 Surgery   Total hysterectomy and bilateral salpingoophorectomy     CHIEF COMPLIANT: Follow-up of triple negative right breast cancer, low back pain  INTERVAL HISTORY: [name] is a 34 y.o. with above-mentioned history of negative right breast cancer who completed neoadjuvant chemotherapy and underwent bilateral mastectomies with reconstruction. She presents to the clinic today for follow-up due to recent lower back pain.    ALLERGIES:  is allergic to lidocaine.  MEDICATIONS:  Current Outpatient Medications  Medication Sig Dispense Refill  . estradiol (CLIMARA - DOSED IN MG/24 HR) 0.025 mg/24hr patch Place 1 patch (0.025 mg total) onto the skin once a week. 4 patch 12  . estrogens, conjugated, (PREMARIN) 0.625 MG tablet Take 1 tablet (0.625 mg total) by mouth daily. 1 tablet daily 90 tablet 0  . ibuprofen (ADVIL) 800 MG tablet Take 1 tablet (800 mg total) by mouth every 8 (eight) hours as needed for moderate pain. For AFTER surgery 30 tablet 0  . phenazopyridine (PYRIDIUM) 100 MG tablet Take 1 tablet (100 mg total) by mouth 3 (three) times daily as needed for pain. 20 tablet 0  . Potassium 99 MG TABS Take 99 mg by mouth daily.    . vitamin B-12 (CYANOCOBALAMIN) 500 MCG tablet Take 500 mcg by mouth daily.     No current facility-administered medications for this visit.    PHYSICAL EXAMINATION: ECOG PERFORMANCE STATUS: 1 - Symptomatic but completely ambulatory  Vitals:   07/22/20 1304  BP: 105/70  Pulse: 70  Resp: 18  Temp: 98.9 F (37.2  C)  SpO2: 100%   Filed Weights   07/22/20 1304  Weight: 135 lb 11.2 oz (61.6 kg)       LABORATORY DATA:  I have reviewed the data as listed CMP Latest Ref Rng & Units 07/21/2019 02/13/2019 07/05/2018  Glucose 70  - 99 mg/dL 100(H) 80 101(H)  BUN 6 - 20 mg/dL _0 Creatinine 0.44 - 1.00 mg/dL 0.66 0.73 0.69  Sodium 135 - 145 mmol/L 140 141 141  Potassium 3.5 - 5.1 mmol/L 4.1 3.4(L) 3.6  Chloride 98 - 111 mmol/L 105 103 104  CO2 22 - 32 mmol/L _1 Calcium 8.9 - 10.3 mg/dL 9.5 9.5 9.9  Total Protein 6.5 - 8.1 g/dL - 7.8 7.3  Total Bilirubin 0.3 - 1.2 mg/dL - 0.3 0.4  Alkaline Phos 38 - 126 U/L - 74 67  AST 15 - 41 U/L - 12(L) 22  ALT 0 - 44 U/L - 8 33    Lab Results  Component Value Date   WBC 5.4 07/22/2020   HGB 12.6 07/22/2020   HCT 37.6 07/22/2020   MCV 88.7 07/22/2020   PLT 224 07/22/2020   NEUTROABS 3.2 07/22/2020    ASSESSMENT & PLAN:  Malignant neoplasm of upper-inner quadrant of right breast in female, estrogen receptor negative ([place]) 02/01/2018:Screening detected right breast mass in the upper inner quadrant measuring 1.5 cm, with additional enhancement it measured 2.2 cm. No axillary lymph nodes. 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 Genetic counseling and testing 2. neoadjuvant chemotherapy with dose dense Adriamycin and Cytoxan every 2 weeks x4 followed by Taxol weekly x12 (changed to Abraxane due to rash) accompanied by carboplatin every 3 weeks x4 completed 07/05/2018 3.Genetics negative  4.  07/29/2018: Bilateral mastectomies: Right mastectomy benign, 0/6 lymph nodes; left mastectomy: Benign, pathologic complete response --------------------------------------------------------------------------------------------------------------------------------- Diffuse aches and pains: CT abdomen 08/22/2019: Benign no evidence of malignancy.  Surveillance: 1.  11/03/2019: Bilateral mastectomies with reconstruction.  Slight dimpling of the left breast implant upper outer quadrant. 2.  No role of imaging because she had bilateral mastectomies.  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  Pelvic and back pain: Unclear etiology.  We will obtain CT chest abdomen pelvis for restaging. Follow-up after that with a telephone visit  Recurrent UTI symptoms: We are obtaining a UA and culture and sensitivity today.  I discussed with her about the pros and cons of olaparib.  We discussed [place]  clinical trial which showed that there was a 7% absolute difference in distant disease-free survival for olaparib versus placebo.  We also discussed the survival advantage of 3.5% with olaparib.  Return to clinic after his regularly scheduled appointment in 3 months.   No orders of the defined types were placed in this encounter.  The patient has a good understanding of the overall plan. she agrees with it. she will call with any problems that may develop before the next visit here.  Total time spent: 30 mins including face to face time and time spent for planning, charting and coordination of care  Nicholas Lose, MD 07/22/2020  I, Cloyde Reams Dorshimer, am acting as scribe for Dr. Nicholas Lose.  I have reviewed the above documentation for accuracy and completeness, and I agree with the above.

## 2020-07-22 ENCOUNTER — Other Ambulatory Visit: Payer: Self-pay

## 2020-07-22 ENCOUNTER — Inpatient Hospital Stay: Payer: BC Managed Care – PPO | Attending: Hematology and Oncology

## 2020-07-22 ENCOUNTER — Inpatient Hospital Stay (HOSPITAL_BASED_OUTPATIENT_CLINIC_OR_DEPARTMENT_OTHER): Payer: BC Managed Care – PPO | Admitting: Hematology and Oncology

## 2020-07-22 DIAGNOSIS — R102 Pelvic and perineal pain: Secondary | ICD-10-CM | POA: Diagnosis not present

## 2020-07-22 DIAGNOSIS — Z9013 Acquired absence of bilateral breasts and nipples: Secondary | ICD-10-CM | POA: Diagnosis not present

## 2020-07-22 DIAGNOSIS — M2550 Pain in unspecified joint: Secondary | ICD-10-CM | POA: Insufficient documentation

## 2020-07-22 DIAGNOSIS — C50211 Malignant neoplasm of upper-inner quadrant of right female breast: Secondary | ICD-10-CM

## 2020-07-22 DIAGNOSIS — Z171 Estrogen receptor negative status [ER-]: Secondary | ICD-10-CM | POA: Diagnosis not present

## 2020-07-22 DIAGNOSIS — R5383 Other fatigue: Secondary | ICD-10-CM | POA: Insufficient documentation

## 2020-07-22 DIAGNOSIS — M545 Low back pain: Secondary | ICD-10-CM | POA: Insufficient documentation

## 2020-07-22 LAB — URINALYSIS, COMPLETE (UACMP) WITH MICROSCOPIC
Bilirubin Urine: NEGATIVE
Glucose, UA: NEGATIVE mg/dL
Hgb urine dipstick: NEGATIVE
Ketones, ur: NEGATIVE mg/dL
Nitrite: NEGATIVE
Protein, ur: NEGATIVE mg/dL
Specific Gravity, Urine: 1.019 (ref 1.005–1.030)
pH: 7 (ref 5.0–8.0)

## 2020-07-22 LAB — CBC WITH DIFFERENTIAL (CANCER CENTER ONLY)
Abs Immature Granulocytes: 0.02 10*3/uL (ref 0.00–0.07)
Basophils Absolute: 0 10*3/uL (ref 0.0–0.1)
Basophils Relative: 0 %
Eosinophils Absolute: 0.1 10*3/uL (ref 0.0–0.5)
Eosinophils Relative: 2 %
HCT: 37.6 % (ref 36.0–46.0)
Hemoglobin: 12.6 g/dL (ref 12.0–15.0)
Immature Granulocytes: 0 %
Lymphocytes Relative: 31 %
Lymphs Abs: 1.7 10*3/uL (ref 0.7–4.0)
MCH: 29.7 pg (ref 26.0–34.0)
MCHC: 33.5 g/dL (ref 30.0–36.0)
MCV: 88.7 fL (ref 80.0–100.0)
Monocytes Absolute: 0.4 10*3/uL (ref 0.1–1.0)
Monocytes Relative: 7 %
Neutro Abs: 3.2 10*3/uL (ref 1.7–7.7)
Neutrophils Relative %: 60 %
Platelet Count: 224 10*3/uL (ref 150–400)
RBC: 4.24 MIL/uL (ref 3.87–5.11)
RDW: 11.8 % (ref 11.5–15.5)
WBC Count: 5.4 10*3/uL (ref 4.0–10.5)
nRBC: 0 % (ref 0.0–0.2)

## 2020-07-22 LAB — CMP (CANCER CENTER ONLY)
ALT: 13 U/L (ref 0–44)
AST: 14 U/L — ABNORMAL LOW (ref 15–41)
Albumin: 4.2 g/dL (ref 3.5–5.0)
Alkaline Phosphatase: 70 U/L (ref 38–126)
Anion gap: 8 (ref 5–15)
BUN: 9 mg/dL (ref 6–20)
CO2: 28 mmol/L (ref 22–32)
Calcium: 10.1 mg/dL (ref 8.9–10.3)
Chloride: 105 mmol/L (ref 98–111)
Creatinine: 0.71 mg/dL (ref 0.44–1.00)
GFR, Est AFR Am: >60 mL/min (ref >60)
GFR, Estimated: >60 mL/min (ref >60)
Glucose, Bld: 83 mg/dL (ref 70–99)
Potassium: 3.8 mmol/L (ref 3.5–5.1)
Sodium: 141 mmol/L (ref 135–145)
Total Bilirubin: 0.5 mg/dL (ref 0.3–1.2)
Total Protein: 7 g/dL (ref 6.5–8.1)

## 2020-07-22 MED ORDER — VENLAFAXINE HCL ER 37.5 MG PO CP24: 37.5000 mg | ORAL_CAPSULE | Freq: Every day | ORAL | 6 refills | Status: DC

## 2020-07-22 NOTE — Assessment & Plan Note (Signed)
02/01/2018:Screening detected right breast mass in the upper inner quadrant measuring 1.5 cm, with additional enhancement it measured 2.2 cm. No axillary lymph nodes. 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 Genetic counseling and testing 2. neoadjuvant chemotherapy with dose dense Adriamycin and Cytoxan every 2 weeks x4 followed by Taxol weekly x12 (changed to Abraxane due to rash) accompanied by carboplatin every 3 weeks x4 completed 07/05/2018 3.Genetics negative  4.  07/29/2018: Bilateral mastectomies: Right mastectomy benign, 0/6 lymph nodes; left mastectomy: Benign, pathologic complete response --------------------------------------------------------------------------------------------------------------------------------- Diffuse aches and pains: CT abdomen 08/22/2019: Benign no evidence of malignancy.  Surveillance: 1.  11/03/2019: Bilateral mastectomies with reconstruction.  Slight dimpling of the left breast implant upper outer quadrant. 2.  No role of imaging because she had bilateral mastectomies.  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  Severe back pain: Unclear etiology.  We will obtain CT chest abdomen pelvis and bone scan for restaging.  Return to clinic in 1 year for follow-up

## 2020-07-23 LAB — URINE CULTURE: Culture: <10000 — AB

## 2020-07-26 ENCOUNTER — Telehealth: Payer: Self-pay | Admitting: Hematology and Oncology

## 2020-07-26 NOTE — Telephone Encounter (Signed)
Informed her that the urine cultures were negative. She is debating the pros and cons of a laparotomy and will inform us of her decision after the scans.

## 2020-08-02 ENCOUNTER — Inpatient Hospital Stay: Payer: BC Managed Care – PPO | Admitting: Hematology and Oncology

## 2020-08-02 ENCOUNTER — Telehealth: Payer: Self-pay | Admitting: Hematology and Oncology

## 2020-08-02 NOTE — Assessment & Plan Note (Deleted)
02/01/2018:Screening detected right breast mass in the upper inner quadrant measuring 1.5 cm, with additional enhancement it measured 2.2 cm. No axillary lymph nodes. 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 Genetic counseling and testing 2. neoadjuvant chemotherapy with dose dense Adriamycin and Cytoxan every 2 weeks x4 followed by Taxol weekly x12 (changed to Abraxane due to rash) accompanied by carboplatin every 3 weeks x4completed 07/05/2018 3.Genetics negative 4.07/29/2018: Bilateral mastectomies: Right mastectomy benign, 0/6 lymph nodes; left mastectomy: Benign, pathologic complete response --------------------------------------------------------------------------------------------------------------------------------- Diffuse aches and pains:CTabdomen8/28/2020: Benign no evidence of malignancy.  Surveillance: 1.11/03/2019: Bilateral mastectomies with reconstruction. Slight dimpling of the left breast implant upper outer quadrant. 2.No role of imaging because she had bilateral mastectomies.  CT CAP 08/03/2020

## 2020-08-03 ENCOUNTER — Ambulatory Visit (HOSPITAL_COMMUNITY): Payer: BC Managed Care – PPO

## 2020-08-12 ENCOUNTER — Other Ambulatory Visit: Payer: Self-pay

## 2020-08-12 ENCOUNTER — Ambulatory Visit (HOSPITAL_COMMUNITY)
Admission: RE | Admit: 2020-08-12 | Discharge: 2020-08-12 | Disposition: A | Discharge status: Home / Self Care | Payer: BLUE CROSS/BLUE SHIELD | Source: Ambulatory Visit | Attending: Hematology and Oncology | Admitting: Hematology and Oncology

## 2020-08-12 ENCOUNTER — Other Ambulatory Visit (HOSPITAL_COMMUNITY): Payer: BC Managed Care – PPO

## 2020-08-12 DIAGNOSIS — C50211 Malignant neoplasm of upper-inner quadrant of right female breast: Secondary | ICD-10-CM | POA: Insufficient documentation

## 2020-08-12 DIAGNOSIS — K7689 Other specified diseases of liver: Secondary | ICD-10-CM | POA: Diagnosis not present

## 2020-08-12 DIAGNOSIS — Z171 Estrogen receptor negative status [ER-]: Secondary | ICD-10-CM | POA: Diagnosis not present

## 2020-08-12 DIAGNOSIS — Z90710 Acquired absence of both cervix and uterus: Secondary | ICD-10-CM | POA: Diagnosis not present

## 2020-08-12 DIAGNOSIS — Z9049 Acquired absence of other specified parts of digestive tract: Secondary | ICD-10-CM | POA: Diagnosis not present

## 2020-08-12 DIAGNOSIS — Z5111 Encounter for antineoplastic chemotherapy: Secondary | ICD-10-CM | POA: Diagnosis not present

## 2020-08-12 DIAGNOSIS — C50919 Malignant neoplasm of unspecified site of unspecified female breast: Secondary | ICD-10-CM | POA: Diagnosis not present

## 2020-08-12 MED ORDER — SODIUM CHLORIDE (PF) 0.9 % IJ SOLN
INTRAMUSCULAR | Status: AC
  Filled 2020-08-12: qty 50

## 2020-08-12 MED ORDER — IOHEXOL 9 MG/ML PO SOLN
ORAL | Status: AC
  Filled 2020-08-12: qty 1000

## 2020-08-12 MED ORDER — IOHEXOL 300 MG/ML  SOLN
100.0000 mL | Freq: Once | INTRAMUSCULAR | Status: AC | PRN
  Administered 2020-08-12: 100 mL via INTRAVENOUS

## 2020-08-12 NOTE — Progress Notes (Signed)
HEMATOLOGY-ONCOLOGY TELEPHONE VISIT PROGRESS NOTE  I connected with Carrie Mew on 08/13/2020 at 11:45 AM EDT by telephone and verified that I am speaking with the correct person using two identifiers.  I discussed the limitations, risks, security and privacy concerns of performing an evaluation and management service by telephone and the availability of in person appointments.  I also discussed with the patient that there may be a patient responsible charge related to this service. The patient expressed understanding and agreed to proceed.   History of Present Illness: [name] is a 34 y.o. female with above-mentioned history of triple negative right breast cancer who completed neoadjuvant chemotherapy and underwent bilateral mastectomies with reconstruction.CT CAP on 08/12/20 showed no evidence of metastatic disease. She presents over the phone today to review her scan.   Oncology History  Malignant neoplasm of upper-inner quadrant of right breast in female, estrogen receptor negative ([place])  02/01/2018 Initial Diagnosis   Screening detected right breast mass in the upper inner quadrant measuring 1.5 cm, with additional enhancement it measured 2.2 cm.  No axillary lymph nodes.  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  The Hereditary Gene Panel offered by [facility] includes sequencing and/or deletion duplication testing of the following 47 genes: APC, ATM, AXIN2, BARD1, BMPR1A, BRCA1, BRCA2, BRIP1, CDH1, CDK4, CDKN2A (p14ARF), CDKN2A (p16INK4a), CHEK2, CTNNA1, DICER1, EPCAM (Deletion/duplication testing only), GREM1 (promoter region deletion/duplication testing only), KIT, MEN1, MLH1, MSH2, MSH3, MSH6, MUTYH, NBN, NF1, NHTL1, PALB2, PDGFRA, PMS2, POLD1, POLE,  PTEN, RAD50, RAD51C, RAD51D, SDHB, SDHC, SDHD, SMAD4, SMARCA4. STK11, TP53, TSC1, TSC2, and VHL.  The following genes were evaluated for sequence changes only: SDHA and HOXB13 c.251G>A variant only. The report date is February 20, 2018.    02/20/2018 - 07/05/2018 Neo-Adjuvant Chemotherapy   Neoadjuvant dose dense Adriamycin and Cytoxan x4 followed by Taxol and carboplatin (received 8 cycles of Taxol and 3 cycles of carboplatin)   07/29/2018 Surgery   Bilateral mastectomies: Right mastectomy benign, 0/6 lymph nodes; left mastectomy: Benign, pathologic complete response   07/22/2019 Surgery   Total hysterectomy and bilateral salpingoophorectomy     Observations/Objective:     Assessment Plan:  Malignant neoplasm of upper-inner quadrant of right breast in female, estrogen receptor negative ([place]) 02/01/2018:Screening detected right breast mass in the upper inner quadrant measuring 1.5 cm, with additional enhancement it measured 2.2 cm. No axillary lymph nodes. 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 Genetic counseling and testing 2. neoadjuvant chemotherapy with dose dense Adriamycin and Cytoxan every 2 weeks x4 followed by Taxol weekly x12 (changed to Abraxane due to rash) accompanied by carboplatin every 3 weeks x4completed 07/05/2018 3.Genetics negative 4.07/29/2018: Bilateral mastectomies: Right mastectomy benign, 0/6 lymph nodes; left mastectomy: Benign, pathologic complete response --------------------------------------------------------------------------------------------------------------------------------- Diffuse aches and pains:CTabdomen8/28/2020: Benign no evidence of malignancy.  Surveillance: 1.11/03/2019: Bilateral mastectomies with reconstruction. Slight dimpling of the left breast implant upper outer quadrant. 2.No role of imaging because she had bilateral  mastectomies.  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  Pelvic and back pain: Could be related to recurrent UTIs. CT CAP 08/13/2020: No evidence of metastatic disease.  No skeletal  metastases.  Benign hypodense lesions in the liver.  Discussing the pros and cons of olaparib. Patient is still undecided on olaparib.  She will inform me with her final decision whenever she makes it. She is concerned about her being a Pharmacist, hospital with olaparib related immunosuppression. If she decides not to take the medicine then we can see her back in 1 year.    I discussed the assessment and treatment plan with the patient. The patient was provided an opportunity to ask questions and all were answered. The patient agreed with the plan and demonstrated an understanding of the instructions. The patient was advised to call back or seek an in-person evaluation if the symptoms worsen or if the condition fails to improve as anticipated.   I provided 15 minutes of non-face-to-face time during this encounter.   Rulon Eisenmenger, MD 08/13/2020    I, Molly Dorshimer, am acting as scribe for Nicholas Lose, MD.  I have reviewed the above documentation for accuracy and completeness, and I agree with the above.

## 2020-08-13 ENCOUNTER — Inpatient Hospital Stay: Payer: BC Managed Care – PPO | Attending: Hematology and Oncology | Admitting: Hematology and Oncology

## 2020-08-13 DIAGNOSIS — Z171 Estrogen receptor negative status [ER-]: Secondary | ICD-10-CM | POA: Diagnosis not present

## 2020-08-13 DIAGNOSIS — C50211 Malignant neoplasm of upper-inner quadrant of right female breast: Secondary | ICD-10-CM

## 2020-08-13 NOTE — Assessment & Plan Note (Signed)
02/01/2018:Screening detected right breast mass in the upper inner quadrant measuring 1.5 cm, with additional enhancement it measured 2.2 cm. No axillary lymph nodes. 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 Genetic counseling and testing 2. neoadjuvant chemotherapy with dose dense Adriamycin and Cytoxan every 2 weeks x4 followed by Taxol weekly x12 (changed to Abraxane due to rash) accompanied by carboplatin every 3 weeks x4completed 07/05/2018 3.Genetics negative 4.07/29/2018: Bilateral mastectomies: Right mastectomy benign, 0/6 lymph nodes; left mastectomy: Benign, pathologic complete response --------------------------------------------------------------------------------------------------------------------------------- Diffuse aches and pains:CTabdomen8/28/2020: Benign no evidence of malignancy.  Surveillance: 1.11/03/2019: Bilateral mastectomies with reconstruction. Slight dimpling of the left breast implant upper outer quadrant. 2.No role of imaging because she had bilateral mastectomies.  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  Pelvic and back pain: Could be related to recurrent UTIs. CT CAP 08/13/2020: No evidence of metastatic disease.  No skeletal metastases.  Benign hypodense lesions in the liver.  Discussing the pros and cons of olaparib.

## 2020-08-17 ENCOUNTER — Telehealth: Payer: Self-pay | Admitting: Hematology and Oncology

## 2020-08-17 ENCOUNTER — Telehealth: Payer: Self-pay

## 2020-08-17 NOTE — Telephone Encounter (Signed)
Pt called to review labs from 07/22/20, as she voices concerns for results. Per Dr Geralyn Flash note and labs, pt was negative for UTI. Reassured pt of these results. Verbalized thanks and understanding.

## 2020-08-17 NOTE — Telephone Encounter (Signed)
Scheduled appts per 8/20 los. Pt confirmed appt date and times.

## 2020-08-23 ENCOUNTER — Telehealth: Payer: Self-pay | Admitting: *Deleted

## 2020-08-23 NOTE — Telephone Encounter (Signed)
Received call from pt with complaint of hot flashes and difficulty sleeping at night.  Pt states she started Effexor 37.5 mg once daily 2 weeks ago with minor relief.  Per MD pt needing to take Effexor 37.5 mg twice daily to alleviate symptoms.  Pt verbalized understanding and states she will keep the office updated on symptoms.

## 2020-08-25 ENCOUNTER — Telehealth: Payer: Self-pay | Admitting: Oncology

## 2020-08-25 NOTE — Telephone Encounter (Signed)
Coralee called and said she recently (3 weeks ago) stopped using the estrogen patch per Dr. Lindi Adie.  Since stopping the patch, she has had hot flashes, has not been able to sleep and has vaginal dryness/lack of desire.  Dr. Lindi Adie started her on Effexor once a day which didn't help and he increased it to twice a day on Monday.  She is wondering if there is anything else she can do/take that would help her symptoms or if she needs to wait another week for the Effexor to start working.

## 2020-08-25 NOTE — Telephone Encounter (Signed)
Called [name] back and advised her to give the Effexor more time to work and to call Dr. Geralyn Flash office if it still does not help to discuss other options.  She verbalized understanding and agreement.

## 2020-09-06 ENCOUNTER — Other Ambulatory Visit: Payer: Self-pay | Admitting: *Deleted

## 2020-09-06 MED ORDER — VENLAFAXINE HCL ER 37.5 MG PO CP24: 37.5000 mg | ORAL_CAPSULE | Freq: Two times a day (BID) | ORAL | 1 refills | Status: DC

## 2020-09-21 DIAGNOSIS — D485 Neoplasm of uncertain behavior of skin: Secondary | ICD-10-CM | POA: Diagnosis not present

## 2020-09-21 DIAGNOSIS — L853 Xerosis cutis: Secondary | ICD-10-CM | POA: Diagnosis not present

## 2020-09-21 DIAGNOSIS — L820 Inflamed seborrheic keratosis: Secondary | ICD-10-CM | POA: Diagnosis not present

## 2020-09-21 DIAGNOSIS — B079 Viral wart, unspecified: Secondary | ICD-10-CM | POA: Diagnosis not present

## 2020-09-21 DIAGNOSIS — D2271 Melanocytic nevi of right lower limb, including hip: Secondary | ICD-10-CM | POA: Diagnosis not present

## 2020-10-06 ENCOUNTER — Telehealth: Payer: Self-pay | Admitting: *Deleted

## 2020-10-06 NOTE — Telephone Encounter (Signed)
Received call from pt with complaint of chest pain x5 days.  Pt states she has recently started running again and experienced the chest pain after exercise.  Pt states it feels like a pulled muscle and has also called Dr. Para Skeans office as well.  Pt states she has an apt tomorrow with Dr. Iran Planas and will notify our office for further evaluation if needed.

## 2020-10-09 ENCOUNTER — Other Ambulatory Visit: Payer: Self-pay | Admitting: Hematology and Oncology

## 2020-10-14 ENCOUNTER — Other Ambulatory Visit: Payer: Self-pay | Admitting: *Deleted

## 2020-10-14 DIAGNOSIS — C50211 Malignant neoplasm of upper-inner quadrant of right female breast: Secondary | ICD-10-CM

## 2020-10-14 NOTE — Progress Notes (Signed)
Received call from pt with complaint of new chest wall nodule located 2 inches under left clavicle. Pt states nodule was found last night.  Pt states over the last two weeks she has experienced chest tightness and thought it was related to increase in working out. Pt denies recent injury or trauma.  Per MD pt to have breast US with possible biopsy of left chest wall nodule. Orders placed and apt scheduled. Pt verbalized understanding of apt date and time.

## 2020-10-15 ENCOUNTER — Other Ambulatory Visit: Payer: Self-pay

## 2020-10-15 ENCOUNTER — Ambulatory Visit
Admission: RE | Admit: 2020-10-15 | Discharge: 2020-10-15 | Disposition: A | Discharge status: Home / Self Care | Payer: BLUE CROSS/BLUE SHIELD | Source: Ambulatory Visit | Attending: Hematology and Oncology | Admitting: Hematology and Oncology

## 2020-10-15 DIAGNOSIS — C50211 Malignant neoplasm of upper-inner quadrant of right female breast: Secondary | ICD-10-CM

## 2020-10-15 DIAGNOSIS — Z171 Estrogen receptor negative status [ER-]: Secondary | ICD-10-CM

## 2020-10-15 DIAGNOSIS — N6489 Other specified disorders of breast: Secondary | ICD-10-CM | POA: Diagnosis not present

## 2020-10-20 DIAGNOSIS — Z20828 Contact with and (suspected) exposure to other viral communicable diseases: Secondary | ICD-10-CM | POA: Diagnosis not present

## 2020-10-20 DIAGNOSIS — R509 Fever, unspecified: Secondary | ICD-10-CM | POA: Diagnosis not present

## 2020-10-26 ENCOUNTER — Inpatient Hospital Stay: Payer: BC Managed Care – PPO | Attending: Hematology and Oncology | Admitting: Hematology and Oncology

## 2020-10-26 NOTE — Assessment & Plan Note (Deleted)
02/01/2018:Screening detected right breast mass in the upper inner quadrant measuring 1.5 cm, with additional enhancement it measured 2.2 cm. No axillary lymph nodes. 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 Genetic counseling and testing 2. neoadjuvant chemotherapy with dose dense Adriamycin and Cytoxan every 2 weeks x4 followed by Taxol weekly x12 (changed to Abraxane due to rash) accompanied by carboplatin every 3 weeks x4completed 07/05/2018 3.Genetics negative 4.07/29/2018: Bilateral mastectomies: Right mastectomy benign, 0/6 lymph nodes; left mastectomy: Benign, pathologic complete response --------------------------------------------------------------------------------------------------------------------------------- Diffuse aches and pains:CTabdomen8/28/2020: Benign no evidence of malignancy.  Surveillance: 1.11/03/2019: Bilateral mastectomies with reconstruction. Slight dimpling of the left breast implant upper outer quadrant. 2.No role of imaging because she had bilateral mastectomies.  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  Pelvic and back pain: Could be related to recurrent UTIs. CT CAP 08/13/2020: No evidence of metastatic disease.  No skeletal metastases.  Benign hypodense lesions in the liver.  Palpable concern on the chest wall: Ultrasound 10/15/2020: Benign  Discussing the pros and cons of olaparib.

## 2020-10-29 ENCOUNTER — Other Ambulatory Visit: Payer: Self-pay | Admitting: *Deleted

## 2020-10-29 MED ORDER — VENLAFAXINE HCL ER 75 MG PO CP24: 75.0000 mg | ORAL_CAPSULE | Freq: Every day | ORAL | 3 refills | Status: DC

## 2020-11-03 ENCOUNTER — Ambulatory Visit: Payer: BC Managed Care – PPO | Admitting: Hematology and Oncology

## 2021-03-22 ENCOUNTER — Telehealth: Payer: Self-pay | Admitting: *Deleted

## 2021-03-22 NOTE — Telephone Encounter (Signed)
Received call from pt with complaint of increase in bruising.  Pt denies recent medication changes.  Pt requesting advice from MD if she should f.u with PCP or [place].  Per MD pt not currently taking oral chemotherapy or any medications that would cause a decrease in plt function.  MD advised pt to f.u with PCP for further evaluation and tx. Pt verbalized understanding and appreciative of advice.

## 2021-08-15 ENCOUNTER — Other Ambulatory Visit: Payer: Self-pay

## 2021-08-15 ENCOUNTER — Ambulatory Visit: Payer: BLUE CROSS/BLUE SHIELD | Admitting: Hematology and Oncology

## 2021-08-15 ENCOUNTER — Other Ambulatory Visit: Payer: BLUE CROSS/BLUE SHIELD

## 2021-08-15 DIAGNOSIS — Z171 Estrogen receptor negative status [ER-]: Secondary | ICD-10-CM

## 2021-08-15 NOTE — Assessment & Plan Note (Deleted)
02/01/2018:Screening detected right breast mass in the upper inner quadrant measuring 1.5 cm, with additional enhancement it measured 2.2 cm. No axillary lymph nodes. 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 Genetic counseling and testing 2. neoadjuvant chemotherapy with dose dense Adriamycin and Cytoxan every 2 weeks x4 followed by Taxol weekly x12 (changed to Abraxane due to rash) accompanied by carboplatin every 3 weeks x4completed 07/05/2018 3.Genetics negative 4.07/29/2018: Bilateral mastectomies: Right mastectomy benign, 0/6 lymph nodes; left mastectomy: Benign, pathologic complete response --------------------------------------------------------------------------------------------------------------------------------- Diffuse aches and pains:CTabdomen8/28/2020: Benign no evidence of malignancy.  Surveillance: 1.08/15/2021: Bilateral mastectomies with reconstruction. Slight dimpling of the left breast implant upper outer quadrant. 2.No role of imaging because she had bilateral mastectomies.  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  Pelvic and back pain: Could be related to recurrent UTIs. CT CAP 08/13/2020: No evidence of metastatic disease.  No skeletal metastases.  Benign hypodense lesions in the liver.  Return to clinic in 1 year for follow-up

## 2021-08-23 ENCOUNTER — Telehealth: Payer: Self-pay

## 2021-08-23 DIAGNOSIS — C50211 Malignant neoplasm of upper-inner quadrant of right female breast: Secondary | ICD-10-CM

## 2021-08-23 NOTE — Telephone Encounter (Signed)
RN returned call to patient regarding need for re-scheduling for 1 year follow up. Pt verbalized she is concerned because she having an increase in bone pain.    History of triple negative breast cancer, bilateral mastectomies.    Pt states most of the bone pain is in the hands and feet.  No new injuries.  Verbalized she would like scans if MD is in agreement prior to follow up.   MD recommendations for CT CAP and bone scan.  Once authorized RN will notify patient to schedule follow up.  Pt aware, verbalized understanding and agreement.

## 2021-09-01 ENCOUNTER — Ambulatory Visit (HOSPITAL_COMMUNITY)
Admission: RE | Admit: 2021-09-01 | Discharge: 2021-09-01 | Disposition: A | Discharge status: Home / Self Care | Payer: BC Managed Care – PPO | Source: Ambulatory Visit | Attending: Hematology and Oncology | Admitting: Hematology and Oncology

## 2021-09-01 ENCOUNTER — Other Ambulatory Visit: Payer: Self-pay

## 2021-09-01 ENCOUNTER — Encounter (HOSPITAL_COMMUNITY)
Admission: RE | Admit: 2021-09-01 | Discharge: 2021-09-01 | Disposition: A | Discharge status: Home / Self Care | Payer: BC Managed Care – PPO | Source: Ambulatory Visit | Attending: Hematology and Oncology | Admitting: Hematology and Oncology

## 2021-09-01 ENCOUNTER — Inpatient Hospital Stay: Payer: BC Managed Care – PPO | Attending: Adult Health

## 2021-09-01 DIAGNOSIS — Z9221 Personal history of antineoplastic chemotherapy: Secondary | ICD-10-CM | POA: Diagnosis not present

## 2021-09-01 DIAGNOSIS — Z171 Estrogen receptor negative status [ER-]: Secondary | ICD-10-CM | POA: Insufficient documentation

## 2021-09-01 DIAGNOSIS — C50211 Malignant neoplasm of upper-inner quadrant of right female breast: Secondary | ICD-10-CM | POA: Insufficient documentation

## 2021-09-01 DIAGNOSIS — N951 Menopausal and female climacteric states: Secondary | ICD-10-CM | POA: Diagnosis not present

## 2021-09-01 DIAGNOSIS — Z9013 Acquired absence of bilateral breasts and nipples: Secondary | ICD-10-CM | POA: Diagnosis not present

## 2021-09-01 LAB — CMP (CANCER CENTER ONLY)
ALT: 22 U/L (ref 0–44)
AST: 17 U/L (ref 15–41)
Albumin: 4.3 g/dL (ref 3.5–5.0)
Alkaline Phosphatase: 92 U/L (ref 38–126)
Anion gap: 8 (ref 5–15)
BUN: 9 mg/dL (ref 6–20)
CO2: 29 mmol/L (ref 22–32)
Calcium: 10.1 mg/dL (ref 8.9–10.3)
Chloride: 105 mmol/L (ref 98–111)
Creatinine: 0.72 mg/dL (ref 0.44–1.00)
GFR, Estimated: >60 mL/min (ref >60)
Glucose, Bld: 76 mg/dL (ref 70–99)
Potassium: 4.2 mmol/L (ref 3.5–5.1)
Sodium: 142 mmol/L (ref 135–145)
Total Bilirubin: 0.4 mg/dL (ref 0.3–1.2)
Total Protein: 7.3 g/dL (ref 6.5–8.1)

## 2021-09-01 LAB — CBC WITH DIFFERENTIAL (CANCER CENTER ONLY)
Abs Immature Granulocytes: 0.01 10*3/uL (ref 0.00–0.07)
Basophils Absolute: 0 10*3/uL (ref 0.0–0.1)
Basophils Relative: 1 %
Eosinophils Absolute: 0.1 10*3/uL (ref 0.0–0.5)
Eosinophils Relative: 2 %
HCT: 39.4 % (ref 36.0–46.0)
Hemoglobin: 13 g/dL (ref 12.0–15.0)
Immature Granulocytes: 0 %
Lymphocytes Relative: 25 %
Lymphs Abs: 1.3 10*3/uL (ref 0.7–4.0)
MCH: 28.9 pg (ref 26.0–34.0)
MCHC: 33 g/dL (ref 30.0–36.0)
MCV: 87.6 fL (ref 80.0–100.0)
Monocytes Absolute: 0.4 10*3/uL (ref 0.1–1.0)
Monocytes Relative: 8 %
Neutro Abs: 3.4 10*3/uL (ref 1.7–7.7)
Neutrophils Relative %: 64 %
Platelet Count: 254 10*3/uL (ref 150–400)
RBC: 4.5 MIL/uL (ref 3.87–5.11)
RDW: 11.9 % (ref 11.5–15.5)
WBC Count: 5.2 10*3/uL (ref 4.0–10.5)
nRBC: 0 % (ref 0.0–0.2)

## 2021-09-01 MED ORDER — TECHNETIUM TC 99M MEDRONATE IV KIT
20.1000 | PACK | Freq: Once | INTRAVENOUS | Status: AC
  Administered 2021-09-01: 20.1 via INTRAVENOUS

## 2021-09-01 MED ORDER — IOHEXOL 350 MG/ML SOLN
100.0000 mL | Freq: Once | INTRAVENOUS | Status: AC | PRN
  Administered 2021-09-01: 80 mL via INTRAVENOUS

## 2021-09-01 MED ORDER — IOHEXOL 9 MG/ML PO SOLN
500.0000 mL | ORAL | Status: AC
Start: 2021-09-01 — End: 2021-09-01
  Administered 2021-09-01 (×2): 500 mL via ORAL

## 2021-09-03 NOTE — Progress Notes (Signed)
Patient Care Team: Sasser, Silvestre Moment, MD as PCP - General (Family Medicine) Irene Limbo, MD as Consulting Physician (Plastic Surgery) Gardenia Phlegm, NP as Nurse Practitioner (Hematology and Oncology) Nicholas Lose, MD as Consulting Physician (Hematology and Oncology) Rolm Bookbinder, MD as Consulting Physician (General Surgery)  DIAGNOSIS:    ICD-10-CM   1. Malignant neoplasm of upper-inner quadrant of right breast in female, estrogen receptor negative ([place])  C50.211    Z17.1       SUMMARY OF ONCOLOGIC HISTORY: Oncology History  Malignant neoplasm of upper-inner quadrant of right breast in female, estrogen receptor negative ([place])  02/01/2018 Initial Diagnosis   Screening detected right breast mass in the upper inner quadrant measuring 1.5 cm, with additional enhancement it measured 2.2 cm.  No axillary lymph nodes.  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  The Hereditary Gene Panel offered by [facility] includes sequencing and/or deletion duplication testing of the following 47 genes: APC, ATM, AXIN2, BARD1, BMPR1A, BRCA1, BRCA2, BRIP1, CDH1, CDK4, CDKN2A (p14ARF), CDKN2A (p16INK4a), CHEK2, CTNNA1, DICER1, EPCAM (Deletion/duplication testing only), GREM1 (promoter region deletion/duplication testing only), KIT, MEN1, MLH1, MSH2, MSH3, MSH6, MUTYH, NBN, NF1, NHTL1, PALB2, PDGFRA, PMS2, POLD1, POLE, PTEN, RAD50, RAD51C, RAD51D, SDHB, SDHC, SDHD, SMAD4, SMARCA4. STK11, TP53, TSC1, TSC2, and VHL.  The following genes were evaluated for sequence changes only: SDHA and HOXB13 c.251G>A variant only. The report date is February 20, 2018.    02/20/2018 - 07/05/2018 Neo-Adjuvant Chemotherapy   Neoadjuvant dose dense Adriamycin and Cytoxan x4 followed by Taxol and  carboplatin (received 8 cycles of Taxol and 3 cycles of carboplatin)   07/29/2018 Surgery   Bilateral mastectomies: Right mastectomy benign, 0/6 lymph nodes; left mastectomy: Benign, pathologic complete response   07/22/2019 Surgery   Total hysterectomy and bilateral salpingoophorectomy     CHIEF COMPLIANT: Follow-up of triple negative right breast cancer  INTERVAL HISTORY: [name] is a 35 y.o. with above-mentioned history of triple negative right breast cancer who completed neoadjuvant chemotherapy and underwent bilateral mastectomies with reconstruction. CT CAP on 09/01/2021 showed no evidence of metastatic disease. She presents to the clinic today for follow-up.  She continues to have diffuse body aches and pains.  Hot flashes are slightly better but she is still experiencing a lot of challenges from it.  She gets withdrawal symptoms if she runs out of Effexor.  ALLERGIES:  is allergic to lidocaine.  MEDICATIONS:  Current Outpatient Medications  Medication Sig Dispense Refill   estradiol (CLIMARA - DOSED IN MG/24 HR) 0.025 mg/24hr patch Place 1 patch (0.025 mg total) onto the skin once a week. 4 patch 12   venlafaxine XR (EFFEXOR-XR) 75 MG 24 hr capsule Take 1 capsule (75 mg total) by mouth daily with breakfast. 90 capsule 3   No current facility-administered medications for this visit.    PHYSICAL EXAMINATION: ECOG PERFORMANCE STATUS: 1 - Symptomatic but completely ambulatory  Vitals:   09/05/21 1049  BP: 115/65  Pulse: 86  Resp: 17  Temp: 97.7 F (36.5 C)  SpO2: 100%   Filed Weights   09/05/21 1049  Weight: 144 lb 5 oz (65.5 kg)    BREAST: No palpable masses or nodules in either right or left breasts. No palpable axillary supraclavicular or infraclavicular adenopathy no breast tenderness  or nipple discharge. (exam performed in the presence of a chaperone)  LABORATORY DATA:  I have reviewed the data as listed CMP Latest Ref Rng & Units 09/01/2021 07/22/2020  07/21/2019  Glucose 70 - 99 mg/dL 76 83 100(H)  BUN 6 - 20 mg/dL _0 Creatinine 0.44 - 1.00 mg/dL 0.72 0.71 0.66  Sodium 135 - 145 mmol/L 142 141 140  Potassium 3.5 - 5.1 mmol/L 4.2 3.8 4.1  Chloride 98 - 111 mmol/L 105 105 105  CO2 22 - 32 mmol/L _1 Calcium 8.9 - 10.3 mg/dL 10.1 10.1 9.5  Total Protein 6.5 - 8.1 g/dL 7.3 7.0 -  Total Bilirubin 0.3 - 1.2 mg/dL 0.4 0.5 -  Alkaline Phos 38 - 126 U/L 92 70 -  AST 15 - 41 U/L 17 14(L) -  ALT 0 - 44 U/L 22 13 -    Lab Results  Component Value Date   WBC 5.2 09/01/2021   HGB 13.0 09/01/2021   HCT 39.4 09/01/2021   MCV 87.6 09/01/2021   PLT 254 09/01/2021   NEUTROABS 3.4 09/01/2021    ASSESSMENT & PLAN:  Malignant neoplasm of upper-inner quadrant of right breast in female, estrogen receptor negative ([place]) 02/01/2018:Screening detected right breast mass in the upper inner quadrant measuring 1.5 cm, with additional enhancement it measured 2.2 cm.  No axillary lymph nodes.  Biopsy revealed IDC with DCIS, grade 3, ER 0%, PR 0%, HER-2 negative, Ki-67 80%, T1CN0 stage Ib clinical stage   Recommendation: 1.  Genetic counseling and testing 2. neoadjuvant chemotherapy with dose dense Adriamycin and Cytoxan every 2 weeks x4 followed by Taxol weekly x12 (changed to Abraxane due to rash)  accompanied by carboplatin every 3 weeks x4 completed 07/05/2018 3.  Genetics BRCA1 mutation and MLH1 4.  07/29/2018: Bilateral mastectomies: Right mastectomy benign, 0/6 lymph nodes; left mastectomy: Benign, pathologic complete response --------------------------------------------------------------------------------------------------------------------------------- Diffuse aches and pains: CT abdomen 08/22/2019: Benign no evidence of malignancy.   Surveillance: 1.  11/03/2019: Bilateral mastectomies with reconstruction.  Slight dimpling of the left breast implant upper outer quadrant. 2.  No role of imaging because she had bilateral mastectomies.   Severe  fatigue and generalized muscle aches and pains: Intermittently Severe hot flashes: Currently on Effexor and it appears to be helping her but not adequately.  I recommended increasing the dosage to 150 mg daily. 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   Pelvic and back pain: CT CAP 09/02/2021: No metastatic disease.  Mild hepatic steatosis, 2 mm nonobstructive stone right kidney Bone scan: 09/02/2021: No metastatic disease or trauma to the right second toe  Patient is undecided about olaparib.  Return to clinic in 1 year for follow-up with scans done ahead of time.    No orders of the defined types were placed in this encounter.  The patient has a good understanding of the overall plan. she agrees with it. she will call with any problems that may develop before the next visit here.  Total time spent: 20 mins including face to face time and time spent for planning, charting and coordination of care  Rulon Eisenmenger, MD, MPH 09/05/2021  I, Thana Ates, am acting as scribe for Dr. Nicholas Lose.  I have reviewed the above documentation for accuracy and completeness, and I agree with the above.

## 2021-09-05 ENCOUNTER — Other Ambulatory Visit: Payer: Self-pay

## 2021-09-05 ENCOUNTER — Inpatient Hospital Stay (HOSPITAL_BASED_OUTPATIENT_CLINIC_OR_DEPARTMENT_OTHER): Payer: BC Managed Care – PPO | Admitting: Hematology and Oncology

## 2021-09-05 DIAGNOSIS — C50211 Malignant neoplasm of upper-inner quadrant of right female breast: Secondary | ICD-10-CM

## 2021-09-05 DIAGNOSIS — Z171 Estrogen receptor negative status [ER-]: Secondary | ICD-10-CM | POA: Diagnosis not present

## 2021-09-05 MED ORDER — VENLAFAXINE HCL ER 150 MG PO CP24: 150.0000 mg | ORAL_CAPSULE | Freq: Every day | ORAL | 3 refills | Status: DC

## 2021-09-05 NOTE — Assessment & Plan Note (Signed)
02/01/2018:Screening detected right breast mass in the upper inner quadrant measuring 1.5 cm, with additional enhancement it measured 2.2 cm. No axillary lymph nodes. 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 Genetic counseling and testing 2. neoadjuvant chemotherapy with dose dense Adriamycin and Cytoxan every 2 weeks x4 followed by Taxol weekly x12 (changed to Abraxane due to rash) accompanied by carboplatin every 3 weeks x4completed 07/05/2018 3.Genetics BRCA1 mutation and MLH1 4.07/29/2018: Bilateral mastectomies: Right mastectomy benign, 0/6 lymph nodes; left mastectomy: Benign, pathologic complete response --------------------------------------------------------------------------------------------------------------------------------- Diffuse aches and pains:CTabdomen8/28/2020: Benign no evidence of malignancy.  Surveillance: 1.11/03/2019: Bilateral mastectomies with reconstruction. Slight dimpling of the left breast implant upper outer quadrant. 2.No role of imaging because she had bilateral mastectomies.  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  Pelvic and back pain: CT CAP 09/02/2021: No metastatic disease.  Mild hepatic steatosis, 2 mm nonobstructive stone right kidney Bone scan: Results are pending Patient is undecided about olaparib.  Return to clinic in 1 year for follow-up

## 2021-10-17 ENCOUNTER — Other Ambulatory Visit: Payer: Self-pay | Admitting: Hematology and Oncology

## 2022-02-08 ENCOUNTER — Telehealth: Payer: Self-pay

## 2022-02-08 NOTE — Telephone Encounter (Signed)
Return call to pt, pt states she is having pain under her ribs/mid back and had previously been told she had a small kidney stone.  Pain is intermittent, denies fever, n/v, does state she doesn't drink enough water.  I encouraged pt to drink plenty of fluids.  Pt states she has a PCP local that she is comfortable following up with to be further evaluated and will follow up with [place] if needed.  Instructed pt if she has fever, n/v to seek more urgent care, pt verbalized understanding and thanks.

## 2022-02-16 ENCOUNTER — Other Ambulatory Visit: Payer: Self-pay | Admitting: *Deleted

## 2022-02-16 DIAGNOSIS — Z171 Estrogen receptor negative status [ER-]: Secondary | ICD-10-CM

## 2022-02-16 DIAGNOSIS — C50211 Malignant neoplasm of upper-inner quadrant of right female breast: Secondary | ICD-10-CM

## 2022-02-16 NOTE — Progress Notes (Signed)
Received recent CT report from [facility].  MD reviewed and requested MRI of liver to evaluate  liver lesions.  Orders placed with expected date within 1 week. Appt scheduled, pt notified and verbalized understanding.

## 2022-02-22 ENCOUNTER — Other Ambulatory Visit: Payer: Self-pay

## 2022-02-22 ENCOUNTER — Ambulatory Visit (HOSPITAL_COMMUNITY)
Admission: RE | Admit: 2022-02-22 | Discharge: 2022-02-22 | Disposition: A | Discharge status: Home / Self Care | Payer: BC Managed Care – PPO | Source: Ambulatory Visit | Attending: Hematology and Oncology | Admitting: Hematology and Oncology

## 2022-02-22 DIAGNOSIS — C50211 Malignant neoplasm of upper-inner quadrant of right female breast: Secondary | ICD-10-CM | POA: Insufficient documentation

## 2022-02-22 DIAGNOSIS — Z171 Estrogen receptor negative status [ER-]: Secondary | ICD-10-CM | POA: Insufficient documentation

## 2022-02-22 MED ORDER — GADOBUTROL 1 MMOL/ML IV SOLN
6.0000 mL | Freq: Once | INTRAVENOUS | Status: AC | PRN
  Administered 2022-02-22: 6 mL via INTRAVENOUS

## 2022-02-23 ENCOUNTER — Telehealth: Payer: BC Managed Care – PPO | Admitting: Hematology and Oncology

## 2022-02-24 NOTE — Progress Notes (Signed)
?HEMATOLOGY-ONCOLOGY TELEPHONE VISIT PROGRESS NOTE ? ?I connected with Carrie Mew on 02/27/2022 at  1:30 PM EST by telephone and verified that I am speaking with the correct person using two identifiers.  ?I discussed the limitations, risks, security and privacy concerns of performing an evaluation and management service by telephone and the availability of in person appointments.  ?I also discussed with the patient that there may be a patient responsible charge related to this service. The patient expressed understanding and agreed to proceed.  ? ?History of Present Illness: [name] is a 36 y.o. female with above-mentioned history of triple negative right breast cancer who completed neoadjuvant chemotherapy and underwent bilateral mastectomies with reconstruction. She presents via telephone today for follow-up.  ? ?Oncology History  ?Malignant neoplasm of upper-inner quadrant of right breast in female, estrogen receptor negative ([place])  ?02/01/2018 Initial Diagnosis  ? Screening detected right breast mass in the upper inner quadrant measuring 1.5 cm, with additional enhancement it measured 2.2 cm.  No axillary lymph nodes.  Biopsy revealed IDC with DCIS, grade 3, ER 0%, PR 0%, HER-2 negative, Ki-67 80%, T1CN0 stage Ib clinical stage ?  ?02/20/2018 Genetic Testing  ? BRCA1 c.2722G>T pathogenic mutation and MLH1 c.979C>G and RAD51C c.506T>C VUS identified on the common hereditary cancer panel.  The Hereditary Gene Panel offered by [facility] includes sequencing and/or deletion duplication testing of the following 47 genes: APC, ATM, AXIN2, BARD1, BMPR1A, BRCA1, BRCA2, BRIP1, CDH1, CDK4, CDKN2A (p14ARF), CDKN2A (p16INK4a), CHEK2, CTNNA1, DICER1, EPCAM (Deletion/duplication testing only), GREM1 (promoter region deletion/duplication testing only), KIT, MEN1, MLH1, MSH2, MSH3, MSH6, MUTYH, NBN, NF1, NHTL1, PALB2, PDGFRA, PMS2, POLD1, POLE, PTEN, RAD50, RAD51C, RAD51D, SDHB, SDHC, SDHD, SMAD4, SMARCA4. STK11,  TP53, TSC1, TSC2, and VHL.  The following genes were evaluated for sequence changes only: SDHA and HOXB13 c.251G>A variant only. The report date is February 20, 2018. ? ?  ?02/20/2018 - 07/05/2018 Neo-Adjuvant Chemotherapy  ? Neoadjuvant dose dense Adriamycin and Cytoxan x4 followed by Taxol and carboplatin (received 8 cycles of Taxol and 3 cycles of carboplatin) ?  ?07/29/2018 Surgery  ? Bilateral mastectomies: Right mastectomy benign, 0/6 lymph nodes; left mastectomy: Benign, pathologic complete response ?  ?07/22/2019 Surgery  ? Total hysterectomy and bilateral salpingoophorectomy ?  ? ? ?Observations/Objective:  ? ?  ?Assessment Plan:  ?Malignant neoplasm of upper-inner quadrant of right breast in female, estrogen receptor negative ([place]) ?02/01/2018:Screening detected right breast mass in the upper inner quadrant measuring 1.5 cm, with additional enhancement it measured 2.2 cm.  No axillary lymph nodes.  Biopsy revealed IDC with DCIS, grade 3, ER 0%, PR 0%, HER-2 negative, Ki-67 80%, T1CN0 stage Ib clinical stage ?  ?Recommendation: ?1.  Genetic counseling and testing ?2. neoadjuvant chemotherapy with dose dense Adriamycin and Cytoxan every 2 weeks x4 followed by Taxol weekly x12 (changed to Abraxane due to rash)  accompanied by carboplatin every 3 weeks x4 completed 07/05/2018 ?3.  Genetics BRCA1 mutation and MLH1 ?4.  07/29/2018: Bilateral mastectomies: Right mastectomy benign, 0/6 lymph nodes; left mastectomy: Benign, pathologic complete response ?--------------------------------------------------------------------------------------------------------------------------------- ?Surveillance: ?1.  11/03/2019: Bilateral mastectomies with reconstruction.  Slight dimpling of the left breast implant upper outer quadrant. ?2.  No role of imaging because she had bilateral mastectomies. ?  ?Severe fatigue and generalized muscle aches and pains: Intermittently ?Severe hot flashes: Currently on Effexor and it appears to be helping  her but not adequately.  I recommended increasing the dosage to 150 mg daily. ?She tells me that she has  profound withdrawal symptoms if she does not have Effexor ?  ?She is a Pharmacist, hospital at [employer]. ?  ?Patient is undecided about olaparib.  ?02/10/2022: CT CAP at [facility]: Mild hepatic steatosis with subcentimeter liver lesions less well deviated on the current exam ?Liver MRI 02/23/2022: No change in the multifocal T2 hyperintense liver lesions both sides of the liver compared to 09/01/2021 favored to be multiple cysts.  Recommended a 51-monthfollow-up ? ?I will send her detailed information of the clinical trial with olaparib and she will make a decision based on that. ?When we see her back in 3 months we will discuss this as well. ? ?I discussed the assessment and treatment plan with the patient. The patient was provided an opportunity to ask questions and all were answered. The patient agreed with the plan and demonstrated an understanding of the instructions. The patient was advised to call back or seek an in-person evaluation if the symptoms worsen or if the condition fails to improve as anticipated.  ? ?Total time spent: 12 mins including non-face to face time and time spent for planning, charting and coordination of care ? ?VRulon Eisenmenger MD ?02/27/2022  ? ? I, KThana Ates am acting as scribe for VNicholas Lose MD. ? ?I have reviewed the above documentation for accuracy and completeness, and I agree with the above. ?  ? ?

## 2022-02-27 ENCOUNTER — Inpatient Hospital Stay: Payer: BC Managed Care – PPO | Attending: Hematology and Oncology | Admitting: Hematology and Oncology

## 2022-02-27 DIAGNOSIS — C50211 Malignant neoplasm of upper-inner quadrant of right female breast: Secondary | ICD-10-CM | POA: Diagnosis not present

## 2022-02-27 DIAGNOSIS — Z171 Estrogen receptor negative status [ER-]: Secondary | ICD-10-CM

## 2022-02-27 DIAGNOSIS — K769 Liver disease, unspecified: Secondary | ICD-10-CM

## 2022-02-27 NOTE — Assessment & Plan Note (Signed)
02/01/2018:Screening detected right breast mass in the upper inner quadrant measuring 1.5 cm, with additional enhancement it measured 2.2 cm. ?No axillary lymph nodes. ?Biopsy revealed IDC with DCIS, grade 3, ER 0%, PR 0%, HER-2 negative, Ki-67 80%, T1CN0 stage Ib clinical stage ?? ?Recommendation: ?1. ?Genetic counseling and testing ?2. neoadjuvant chemotherapy with dose dense Adriamycin and Cytoxan every 2 weeks x4 followed by Taxol weekly x12 (changed to Abraxane due to rash) ?accompanied by carboplatin every 3 weeks x4?completed 07/05/2018 ?3.??Genetics BRCA1 mutation and MLH1 ?4.??07/29/2018: Bilateral mastectomies: Right mastectomy benign, 0/6 lymph nodes; left mastectomy: Benign, pathologic complete response ?--------------------------------------------------------------------------------------------------------------------------------- ?Diffuse aches and pains:?CT?abdomen?08/22/2019: Benign no evidence of malignancy. ?? ?Surveillance: ?1.??11/03/2019: Bilateral mastectomies with reconstruction. ?Slight dimpling of the left breast implant upper outer quadrant. ?2.??No role of imaging because she had bilateral mastectomies. ?? ?Severe fatigue and generalized muscle aches and pains:?Intermittently ?Severe hot flashes: Currently on Effexor and it appears to be helping her but not adequately.  I recommended increasing the dosage to 150 mg daily. ?She tells me that she has profound withdrawal symptoms if she does not have Effexor ?? 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 ?? ?Patient is undecided about olaparib.  ?02/10/2022: CT CAP at [facility]: Mild hepatic steatosis with subcentimeter liver lesions less well deviated on the current exam ?Liver MRI 02/23/2022: No change in the multifocal T2 hyperintense liver lesions both sides of the liver compared to 09/01/2021 favored to be multiple cysts.  Recommended a 59-monthfollow-up ?

## 2022-02-28 ENCOUNTER — Telehealth: Payer: Self-pay | Admitting: Hematology and Oncology

## 2022-02-28 NOTE — Telephone Encounter (Signed)
Scheduled appointment per 3/6 los. Unable to leave a message due to mailbox being full. Patient will be mailed an updated calendar. ?

## 2022-05-29 NOTE — Progress Notes (Signed)
HEMATOLOGY-ONCOLOGY TELEPHONE VISIT PROGRESS NOTE  I connected with [name] on 06/12/22 at 10:00 AM EDT by telephone and verified that I am speaking with the correct person using two identifiers.  I discussed the limitations, risks, security and privacy concerns of performing an evaluation and management service by telephone and the availability of in person appointments.  I also discussed with the patient that there may be a patient responsible charge related to this service. The patient expressed understanding and agreed to proceed.   History of Present Illness: [name] is a 36 y.o. female with above-mentioned history of triple negative right breast cancer who completed neoadjuvant chemotherapy and underwent bilateral mastectomies with reconstruction. She presents via telephone today for follow-up.   Oncology History  Malignant neoplasm of upper-inner quadrant of right breast in female, estrogen receptor negative ([place])  02/01/2018 Initial Diagnosis   Screening detected right breast mass in the upper inner quadrant measuring 1.5 cm, with additional enhancement it measured 2.2 cm.  No axillary lymph nodes.  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  The Hereditary Gene Panel offered by [facility] includes sequencing and/or deletion duplication testing of the following 47 genes: APC, ATM, AXIN2, BARD1, BMPR1A, BRCA1, BRCA2, BRIP1, CDH1, CDK4, CDKN2A (p14ARF), CDKN2A (p16INK4a), CHEK2, CTNNA1, DICER1, EPCAM (Deletion/duplication testing only), GREM1 (promoter region deletion/duplication testing only), KIT, MEN1, MLH1, MSH2, MSH3, MSH6, MUTYH, NBN, NF1, NHTL1, PALB2, PDGFRA, PMS2, POLD1, POLE, PTEN, RAD50, RAD51C, RAD51D, SDHB, SDHC, SDHD, SMAD4, SMARCA4. STK11, TP53, TSC1, TSC2,  and VHL.  The following genes were evaluated for sequence changes only: SDHA and HOXB13 c.251G>A variant only. The report date is February 20, 2018.    02/20/2018 - 07/05/2018 Neo-Adjuvant Chemotherapy   Neoadjuvant dose dense Adriamycin and Cytoxan x4 followed by Taxol and carboplatin (received 8 cycles of Taxol and 3 cycles of carboplatin)   07/29/2018 Surgery   Bilateral mastectomies: Right mastectomy benign, 0/6 lymph nodes; left mastectomy: Benign, pathologic complete response   07/22/2019 Surgery   Total hysterectomy and bilateral salpingoophorectomy     REVIEW OF SYSTEMS:   Constitutional:   All other systems were reviewed with the patient and are negative.  Observations/Objective:     Assessment Plan:  Malignant neoplasm of upper-inner quadrant of right breast in female, estrogen receptor negative ([place]) 02/01/2018:Screening detected right breast mass in the upper inner quadrant measuring 1.5 cm, with additional enhancement it measured 2.2 cm.  No axillary lymph nodes.  Biopsy revealed IDC with DCIS, grade 3, ER 0%, PR 0%, HER-2 negative, Ki-67 80%, T1CN0 stage Ib clinical stage   Recommendation: 1.  Genetic counseling and testing 2. neoadjuvant chemotherapy with dose dense Adriamycin and Cytoxan every 2 weeks x4 followed by Taxol weekly x12 (changed to Abraxane due to rash)  accompanied by carboplatin every 3 weeks x4 completed 07/05/2018 3.  Genetics BRCA1 mutation and MLH1 4.  07/29/2018: Bilateral mastectomies: Right mastectomy benign, 0/6 lymph nodes; left mastectomy: Benign, pathologic complete response --------------------------------------------------------------------------------------------------------------------------------- Diffuse aches and pains: CT abdomen 08/22/2019: Benign no evidence of malignancy.   Surveillance: 1.  11/03/2019: Bilateral mastectomies with reconstruction.  Slight dimpling of the left breast implant upper outer quadrant. 2.  No role of imaging because  she had bilateral mastectomies.   Severe fatigue and generalized muscle aches and pains: Intermittently Severe  hot flashes: Currently on Effexor and it appears to be helping her but not adequately.  I recommended increasing the dosage to 150 mg daily. 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   Patient decided not to take adjuvant olaparib.  02/10/2022: CT CAP at [facility]: Mild hepatic steatosis with subcentimeter liver lesions less well deviated on the current exam Liver MRI 02/23/2022: No change in the multifocal T2 hyperintense liver lesions both sides of the liver compared to 09/01/2021 favored to be multiple cysts.    Superficial vein thrombosis: Currently on Xarelto I recommended that she use Xarelto for 3 months and then discontinue it.   Will obtain CT CAP for eval of cancer and hypercoagulability work up Patient is in The Midst of Changing Her Insurance and She Will Call us Back to Times When She Can Do the CT Scans and the Lab Work.    I discussed the assessment and treatment plan with the patient. The patient was provided an opportunity to ask questions and all were answered. The patient agreed with the plan and demonstrated an understanding of the instructions. The patient was advised to call back or seek an in-person evaluation if the symptoms worsen or if the condition fails to improve as anticipated.   I provided 12 minutes of non-face-to-face time during this encounter. Harriette Ohara, MD  I Gardiner Coins am scribing for Dr. Lindi Adie  I have reviewed the above documentation for accuracy and completeness, and I agree with the above.

## 2022-05-31 ENCOUNTER — Encounter: Payer: Self-pay | Admitting: Hematology and Oncology

## 2022-05-31 ENCOUNTER — Other Ambulatory Visit: Payer: Self-pay

## 2022-05-31 ENCOUNTER — Ambulatory Visit (HOSPITAL_COMMUNITY)
Admission: RE | Admit: 2022-05-31 | Discharge: 2022-05-31 | Disposition: A | Discharge status: Home / Self Care | Payer: BC Managed Care – PPO | Source: Ambulatory Visit | Attending: Adult Health | Admitting: Adult Health

## 2022-05-31 ENCOUNTER — Inpatient Hospital Stay: Payer: Self-pay

## 2022-05-31 ENCOUNTER — Inpatient Hospital Stay: Payer: Self-pay | Attending: Hematology and Oncology | Admitting: Adult Health

## 2022-05-31 ENCOUNTER — Telehealth: Payer: Self-pay

## 2022-05-31 VITALS — BP 126/85 | HR 89 | Temp 97.9°F | Resp 18 | Ht 60.0 in | Wt 150.1 lb

## 2022-05-31 DIAGNOSIS — Z171 Estrogen receptor negative status [ER-]: Secondary | ICD-10-CM | POA: Insufficient documentation

## 2022-05-31 DIAGNOSIS — L550 Sunburn of first degree: Secondary | ICD-10-CM | POA: Insufficient documentation

## 2022-05-31 DIAGNOSIS — Z87891 Personal history of nicotine dependence: Secondary | ICD-10-CM | POA: Insufficient documentation

## 2022-05-31 DIAGNOSIS — Z7901 Long term (current) use of anticoagulants: Secondary | ICD-10-CM | POA: Insufficient documentation

## 2022-05-31 DIAGNOSIS — C50211 Malignant neoplasm of upper-inner quadrant of right female breast: Secondary | ICD-10-CM | POA: Insufficient documentation

## 2022-05-31 DIAGNOSIS — Z9013 Acquired absence of bilateral breasts and nipples: Secondary | ICD-10-CM | POA: Insufficient documentation

## 2022-05-31 DIAGNOSIS — I82812 Embolism and thrombosis of superficial veins of left lower extremities: Secondary | ICD-10-CM | POA: Insufficient documentation

## 2022-05-31 DIAGNOSIS — Z9221 Personal history of antineoplastic chemotherapy: Secondary | ICD-10-CM | POA: Insufficient documentation

## 2022-05-31 DIAGNOSIS — K769 Liver disease, unspecified: Secondary | ICD-10-CM | POA: Insufficient documentation

## 2022-05-31 LAB — CMP (CANCER CENTER ONLY)
ALT: 32 U/L (ref 0–44)
AST: 24 U/L (ref 15–41)
Albumin: 4.7 g/dL (ref 3.5–5.0)
Alkaline Phosphatase: 97 U/L (ref 38–126)
Anion gap: 8 (ref 5–15)
BUN: 8 mg/dL (ref 6–20)
CO2: 30 mmol/L (ref 22–32)
Calcium: 10.3 mg/dL (ref 8.9–10.3)
Chloride: 103 mmol/L (ref 98–111)
Creatinine: 0.64 mg/dL (ref 0.44–1.00)
GFR, Estimated: >60 mL/min (ref >60)
Glucose, Bld: 92 mg/dL (ref 70–99)
Potassium: 3.9 mmol/L (ref 3.5–5.1)
Sodium: 141 mmol/L (ref 135–145)
Total Bilirubin: 0.5 mg/dL (ref 0.3–1.2)
Total Protein: 8.3 g/dL — ABNORMAL HIGH (ref 6.5–8.1)

## 2022-05-31 LAB — CBC WITH DIFFERENTIAL (CANCER CENTER ONLY)
Abs Immature Granulocytes: 0.02 10*3/uL (ref 0.00–0.07)
Basophils Absolute: 0 10*3/uL (ref 0.0–0.1)
Basophils Relative: 1 %
Eosinophils Absolute: 0.1 10*3/uL (ref 0.0–0.5)
Eosinophils Relative: 2 %
HCT: 40.9 % (ref 36.0–46.0)
Hemoglobin: 14 g/dL (ref 12.0–15.0)
Immature Granulocytes: 0 %
Lymphocytes Relative: 22 %
Lymphs Abs: 1.4 10*3/uL (ref 0.7–4.0)
MCH: 29.5 pg (ref 26.0–34.0)
MCHC: 34.2 g/dL (ref 30.0–36.0)
MCV: 86.1 fL (ref 80.0–100.0)
Monocytes Absolute: 0.5 10*3/uL (ref 0.1–1.0)
Monocytes Relative: 8 %
Neutro Abs: 4.3 10*3/uL (ref 1.7–7.7)
Neutrophils Relative %: 67 %
Platelet Count: 298 10*3/uL (ref 150–400)
RBC: 4.75 MIL/uL (ref 3.87–5.11)
RDW: 12.1 % (ref 11.5–15.5)
WBC Count: 6.4 10*3/uL (ref 4.0–10.5)
nRBC: 0 % (ref 0.0–0.2)

## 2022-05-31 LAB — PROTIME-INR
INR: 1 (ref 0.8–1.2)
Prothrombin Time: 12.6 seconds (ref 11.4–15.2)

## 2022-05-31 LAB — APTT: aPTT: 30 seconds (ref 24–36)

## 2022-05-31 MED ORDER — RIVAROXABAN (XARELTO) VTE STARTER PACK (15 & 20 MG): ORAL_TABLET | ORAL | 0 refills | Status: DC

## 2022-05-31 NOTE — Telephone Encounter (Signed)
Pt called and states she is having LLE swelling/pain, denies redness and states she has tried to relieve swelling by using her hot tub but the jets caused acute/worsening pain to popliteal. Pt states these sx have been going on for about a week. Pt has been scheduled for LLE doppler today at [facility] location in [place]. She lives in [place] and will arrive ASAP. If she is positive for DVT she will be sent to [facility] to have a visit with [name], NP.  Pt also asks about f/u MRI liver MD advised pt to have. Message has been sent to [place] team to obtain auth for scheduling. Pt is scheduled to see MD 6/19 to review scans but they have not been scheduled yet. Will schedule as soon as Josem Kaufmann is obtained. Pt is aware and verbalized thanks.

## 2022-06-01 ENCOUNTER — Encounter: Payer: Self-pay | Admitting: *Deleted

## 2022-06-01 ENCOUNTER — Other Ambulatory Visit: Payer: Self-pay | Admitting: *Deleted

## 2022-06-01 ENCOUNTER — Encounter: Payer: Self-pay | Admitting: Hematology and Oncology

## 2022-06-01 ENCOUNTER — Other Ambulatory Visit (HOSPITAL_COMMUNITY): Payer: Self-pay

## 2022-06-01 DIAGNOSIS — C50211 Malignant neoplasm of upper-inner quadrant of right female breast: Secondary | ICD-10-CM

## 2022-06-01 DIAGNOSIS — K769 Liver disease, unspecified: Secondary | ICD-10-CM

## 2022-06-01 MED ORDER — RIVAROXABAN (XARELTO) VTE STARTER PACK (15 & 20 MG)
ORAL_TABLET | ORAL | 0 refills | Status: DC
  Filled 2022-06-01: qty 51, 30d supply, fill #0

## 2022-06-02 ENCOUNTER — Encounter: Payer: Self-pay | Admitting: Hematology and Oncology

## 2022-06-05 NOTE — Progress Notes (Signed)
Jamie Cancer Follow up:    [name], [name] [address] [place] [address]   DIAGNOSIS: Cancer Staging  Malignant neoplasm of upper-inner quadrant of right breast in female, estrogen receptor negative ([place]) Staging form: Breast, AJCC 8th Edition - Clinical: Stage IIB (cT2, cN0, cM0, G3, ER-, PR-, HER2-) - Unsigned Histologic grading system: 3 grade system - Pathologic: pT0, pN0, cM0 - Unsigned   SUMMARY OF ONCOLOGIC HISTORY: Oncology History  Malignant neoplasm of upper-inner quadrant of right breast in female, estrogen receptor negative ([place])  02/01/2018 Initial Diagnosis   Screening detected right breast mass in the upper inner quadrant measuring 1.5 cm, with additional enhancement it measured 2.2 cm.  No axillary lymph nodes.  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  The Hereditary Gene Panel offered by [facility] includes sequencing and/or deletion duplication testing of the following 47 genes: APC, ATM, AXIN2, BARD1, BMPR1A, BRCA1, BRCA2, BRIP1, CDH1, CDK4, CDKN2A (p14ARF), CDKN2A (p16INK4a), CHEK2, CTNNA1, DICER1, EPCAM (Deletion/duplication testing only), GREM1 (promoter region deletion/duplication testing only), KIT, MEN1, MLH1, MSH2, MSH3, MSH6, MUTYH, NBN, NF1, NHTL1, PALB2, PDGFRA, PMS2, POLD1, POLE, PTEN, RAD50, RAD51C, RAD51D, SDHB, SDHC, SDHD, SMAD4, SMARCA4. STK11, TP53, TSC1, TSC2, and VHL.  The following genes were evaluated for sequence changes only: SDHA and HOXB13 c.251G>A variant only. The report date is February 20, 2018.    02/20/2018 - 07/05/2018 Neo-Adjuvant Chemotherapy   Neoadjuvant dose dense Adriamycin and Cytoxan x4 followed by Taxol and carboplatin (received 8 cycles of Taxol and 3 cycles of carboplatin)   07/29/2018  Surgery   Bilateral mastectomies: Right mastectomy benign, 0/6 lymph nodes; left mastectomy: Benign, pathologic complete response   07/22/2019 Surgery   Total hysterectomy and bilateral salpingoophorectomy     CURRENT THERAPY: Observation  INTERVAL HISTORY: [name] 36 y.o. female returns for    Patient Active Problem List   Diagnosis Date Noted  . Vasomotor symptoms due to menopause 08/22/2019  . Breast cancer, right ([place]) 07/29/2018  . BRCA1 gene mutation positive 02/20/2018  . Genetic testing 02/20/2018  . Breast cancer ([place])   . Family history of breast cancer   . Family history of colon cancer   . Family history of stomach cancer   . Malignant neoplasm of upper-inner quadrant of right breast in female, estrogen receptor negative ([place])   . Acid reflux 01/18/2018  . Diabetes mellitus arising in pregnancy 01/18/2018  . H/O tubal ligation 01/18/2018  . History of palpitations 01/18/2018  . Umbilical swelling 63/87/5643  . Hypokalemia 10/16/2017  . Cervicalgia 11/22/2016  . Low back pain 11/22/2016  . Pain in thoracic spine 11/22/2016  . Hand, foot and mouth disease 11/08/2015  . Incisional hernia, without obstruction or gangrene 09/03/2015  . Acute serous otitis media 07/11/2015  . Abdominal pain of other specified site 04/01/2015  . Umbilical hernia without obstruction and without gangrene 01/05/2014  . Postpartum care following cesarean delivery (11/26) 11/19/2013  . Acute sinusitis 09/15/2013  . Irregular heart rate 08/20/2013  . Heart palpitations 08/20/2013  . Mastodynia 12/09/2012  . Abdominal pain, epigastric 03/29/2012  . Nausea without vomiting 03/29/2012  . Intractable migraine 01/10/2012  . Lumbosacral ligament sprain 01/10/2012  . Thoracic back sprain 01/10/2012  . Influenza with respiratory manifestation 12/20/2011  .  Dysuria 10/11/2011  . Lumbar sprain 10/11/2011  . Benign paroxysmal positional vertigo 06/01/2011  . Other malaise and fatigue  06/01/2011    is allergic to lidocaine.  MEDICAL HISTORY: Past Medical History:  Diagnosis Date  . breast ca dx'd 01/2018   right  . Cluster headaches   . Complication of anesthesia    severe hypotension to the point of syncope  . Family history of breast cancer   . Family history of colon cancer   . Family history of stomach cancer   . History of breast cancer 01/2018   right  . History of chemotherapy    finished 07/05/2018  . History of palpitations    due to low K+  . Hyperthyroidism    no current med.  . Infection of left breast 11/11/2018   to start antibiotic 11/11/2018  . Irritable bowel syndrome (IBS)    no current med.    SURGICAL HISTORY: Past Surgical History:  Procedure Laterality Date  . BREAST RECONSTRUCTION WITH PLACEMENT OF TISSUE EXPANDER AND ALLODERM Bilateral 07/29/2018   Procedure: BILATERAL BREAST RECONSTRUCTION WITH PLACEMENT OF TISSUE EXPANDER AND ALLODERM;  Surgeon: Irene Limbo, [name];  Location: [facility];  Service: Plastics;  Laterality: Bilateral;  . CESAREAN SECTION    . CESAREAN SECTION WITH BILATERAL TUBAL LIGATION Bilateral 11/19/2013   Procedure: REPEAT CESAREAN SECTION WITH BILATERAL TUBAL LIGATION; TWINS;  Surgeon: Lovenia Kim, [name];  Location: [facility];  Service: Obstetrics;  Laterality: Bilateral;  EDD: 12/09/13  . CHOLECYSTECTOMY    . PORTA CATH REMOVAL Right 11/19/2018   Procedure: PORTA CATH REMOVAL;  Surgeon: Irene Limbo, [name];  Location: [facility];  Service: Plastics;  Laterality: Right;  . PORTACATH PLACEMENT N/A 02/20/2018   Procedure: INSERTION PORT-A-CATH WITH ULTRASOUND;  Surgeon: Rolm Bookbinder, [name];  Location: [facility];  Service: General;  Laterality: N/A;  . REMOVAL OF BILATERAL TISSUE EXPANDERS WITH PLACEMENT OF BILATERAL BREAST IMPLANTS Bilateral 11/19/2018   Procedure: REMOVAL OF BILATERAL TISSUE EXPANDERS WITH PLACEMENT OF BILATERAL BREAST IMPLANTS;  Surgeon:  Irene Limbo, [name];  Location: [facility];  Service: Plastics;  Laterality: Bilateral;  . ROBOTIC ASSISTED TOTAL HYSTERECTOMY WITH BILATERAL SALPINGO OOPHERECTOMY Bilateral 07/22/2019   Procedure: XI ROBOTIC ASSISTED TOTAL HYSTERECTOMY WITH BILATERAL SALPINGO OOPHORECTOMY WITH UMBILICAL HERNIA REPAIR;  Surgeon: Everitt Amber, [name];  Location: [facility];  Service: Gynecology;  Laterality: Bilateral;    SOCIAL HISTORY: Social History   Socioeconomic History  . Marital status: Married    Spouse name: Not on file  . Number of children: Not on file  . Years of education: Not on file  . Highest education level: Not on file  Occupational History  . Not on file  Tobacco Use  . Smoking status: Former    Types: Cigarettes    Quit date: 12/25/2003    Years since quitting: 18.4  . Smokeless tobacco: Never  Vaping Use  . Vaping Use: Never used  Substance and Sexual Activity  . Alcohol use: No  . Drug use: No  . Sexual activity: Not Currently  Other Topics Concern  . Not on file  Social History Narrative  . Not on file   Social Determinants of Health   Financial Resource Strain: Not on file  Food Insecurity: Not on file  Transportation Needs: Not on file  Physical Activity: Not on file  Stress: Not on file  Social Connections: Not on file  Intimate Partner Violence: Not on file  FAMILY HISTORY: Family History  Problem Relation Age of Onset  . Cirrhosis Father   . Breast cancer Paternal Aunt 64       BRCA1 p.E908 pos  . Heart disease Maternal Grandfather        Heart attack  . Breast cancer Paternal Aunt        dx in her 63s  . Colon cancer Maternal Aunt        dx in her 57s  . Lupus Sister        pat 1/2 sister  . Other Brother 81       pat 1/2 brother died in [place]  . Lung cancer Maternal Uncle        smoker  . Other Paternal Aunt        died in a MVA  . Other Paternal Aunt        died from poisioning    Review of Systems - Oncology    PHYSICAL  EXAMINATION  ECOG PERFORMANCE STATUS: {CHL ONC ECOG TT:[id number]}  Vitals:   05/31/22 1519  BP: 126/85  Pulse: 89  Resp: 18  Temp: 97.9 F (36.6 C)  SpO2: 100%    Physical Exam  LABORATORY DATA:  CBC    Component Value Date/Time   WBC 6.4 05/31/2022 1538   WBC 5.6 07/21/2019 1414   RBC 4.75 05/31/2022 1538   HGB 14.0 05/31/2022 1538   HCT 40.9 05/31/2022 1538   PLT 298 05/31/2022 1538   MCV 86.1 05/31/2022 1538   MCH 29.5 05/31/2022 1538   MCHC 34.2 05/31/2022 1538   RDW 12.1 05/31/2022 1538   LYMPHSABS 1.4 05/31/2022 1538   MONOABS 0.5 05/31/2022 1538   EOSABS 0.1 05/31/2022 1538   BASOSABS 0.0 05/31/2022 1538    CMP     Component Value Date/Time   NA 141 05/31/2022 1538   K 3.9 05/31/2022 1538   CL 103 05/31/2022 1538   CO2 30 05/31/2022 1538   GLUCOSE 92 05/31/2022 1538   BUN 8 05/31/2022 1538   CREATININE 0.64 05/31/2022 1538   CALCIUM 10.3 05/31/2022 1538   PROT 8.3 (H) 05/31/2022 1538   ALBUMIN 4.7 05/31/2022 1538   AST 24 05/31/2022 1538   ALT 32 05/31/2022 1538   ALKPHOS 97 05/31/2022 1538   BILITOT 0.5 05/31/2022 1538   GFRNONAA >60 05/31/2022 1538   GFRAA >60 07/22/2020 1251       PENDING LABS:   RADIOGRAPHIC STUDIES:  No results found.   PATHOLOGY:     ASSESSMENT and THERAPY PLAN:   No problem-specific Assessment & Plan notes found for this encounter.   Orders Placed This Encounter  Procedures  . CMP ([facility] only)    Standing Status:   Future    Number of Occurrences:   1    Standing Expiration Date:   06/01/2023  . CBC with Differential (Cancer Center Only)    Standing Status:   Future    Number of Occurrences:   1    Standing Expiration Date:   06/01/2023  . Protime-INR    Standing Status:   Future    Number of Occurrences:   1    Standing Expiration Date:   06/01/2023  . APTT    Standing Status:   Future    Number of Occurrences:   1    Standing Expiration Date:   05/31/2023    All questions were  answered. The patient knows to call the clinic with any problems, questions  or concerns. We can certainly see the patient much sooner if necessary. This note was electronically signed. Scot Dock, NP 06/05/2022

## 2022-06-07 NOTE — Assessment & Plan Note (Addendum)
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    She is here today for f/u of her leg pain and doppler is + for superficial thrombus.  We discussed that clots are either provoked or unprovoked.  I reviewed risk factors for developing a blood clot which include recent travel, recent surgery, a genetic predisposition, and/or active cancer.  We will expedite the liver MRI to evaluate the liver lesions we are following.    I discussed with [name] that her blood clot is not in one of her deep veins, instead it is in her superficial vein.  Since she is symptomatic, Dr. Lindi Adie often recommends a short course of anticoagulants.  We discussed potentially sending in Xarelto for her to take and reviewed risks and benefits with her in detail.  Before starting anticoagulants I want to check CBC, CMET, and coagulation studies to rule out any liver disorder which can impact coagulation (which would likely contraindicate Xarelto).    The above was reviewed with Dr. Chryl Heck who is in agreement with the plan.

## 2022-06-08 ENCOUNTER — Ambulatory Visit (HOSPITAL_BASED_OUTPATIENT_CLINIC_OR_DEPARTMENT_OTHER)
Admission: RE | Admit: 2022-06-08 | Discharge: 2022-06-08 | Disposition: A | Discharge status: Home / Self Care | Payer: Self-pay | Source: Ambulatory Visit | Attending: Hematology and Oncology | Admitting: Hematology and Oncology

## 2022-06-08 ENCOUNTER — Other Ambulatory Visit: Payer: Self-pay

## 2022-06-08 ENCOUNTER — Inpatient Hospital Stay (HOSPITAL_BASED_OUTPATIENT_CLINIC_OR_DEPARTMENT_OTHER): Payer: Self-pay | Admitting: Physician Assistant

## 2022-06-08 VITALS — BP 122/83 | HR 78 | Temp 98.7°F | Resp 16 | Wt 148.4 lb

## 2022-06-08 DIAGNOSIS — L550 Sunburn of first degree: Secondary | ICD-10-CM

## 2022-06-08 DIAGNOSIS — K769 Liver disease, unspecified: Secondary | ICD-10-CM | POA: Insufficient documentation

## 2022-06-08 MED ORDER — FAMOTIDINE 20 MG PO TABS
20.0000 mg | ORAL_TABLET | Freq: Two times a day (BID) | ORAL | 0 refills | Status: DC
Start: 2022-06-08 — End: 2022-06-13

## 2022-06-08 MED ORDER — GADOBUTROL 1 MMOL/ML IV SOLN
7.0000 mL | Freq: Once | INTRAVENOUS | Status: AC | PRN
  Administered 2022-06-08: 7 mL via INTRAVENOUS
  Filled 2022-06-08: qty 7.5

## 2022-06-08 MED ORDER — PREDNISONE 10 MG PO TABS
20.0000 mg | ORAL_TABLET | Freq: Every day | ORAL | 0 refills | Status: DC
Start: 2022-06-08 — End: 2022-06-13

## 2022-06-08 NOTE — Progress Notes (Signed)
Symptom Management Consult note [place]    Patient Care Team: Sasser, Silvestre Moment, MD as PCP - General (Family Medicine) Irene Limbo, MD as Consulting Physician (Plastic Surgery) Delice Bison, Charlestine Massed, NP as Nurse Practitioner (Hematology and Oncology) Nicholas Lose, MD as Consulting Physician (Hematology and Oncology) Rolm Bookbinder, MD as Consulting Physician (General Surgery)    Name of the patient: [name]  [id number]  1986-03-09   Date of visit: 06/08/2022    Chief complaint/ Reason for visit- rash  Oncology History  Malignant neoplasm of upper-inner quadrant of right breast in female, estrogen receptor negative ([place])  02/01/2018 Initial Diagnosis   Screening detected right breast mass in the upper inner quadrant measuring 1.5 cm, with additional enhancement it measured 2.2 cm.  No axillary lymph nodes.  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  The Hereditary Gene Panel offered by [facility] includes sequencing and/or deletion duplication testing of the following 47 genes: APC, ATM, AXIN2, BARD1, BMPR1A, BRCA1, BRCA2, BRIP1, CDH1, CDK4, CDKN2A (p14ARF), CDKN2A (p16INK4a), CHEK2, CTNNA1, DICER1, EPCAM (Deletion/duplication testing only), GREM1 (promoter region deletion/duplication testing only), KIT, MEN1, MLH1, MSH2, MSH3, MSH6, MUTYH, NBN, NF1, NHTL1, PALB2, PDGFRA, PMS2, POLD1, POLE, PTEN, RAD50, RAD51C, RAD51D, SDHB, SDHC, SDHD, SMAD4, SMARCA4. STK11, TP53, TSC1, TSC2, and VHL.  The following genes were evaluated for sequence changes only: SDHA and HOXB13 c.251G>A variant only. The report date is February 20, 2018.    02/20/2018 - 07/05/2018 Neo-Adjuvant Chemotherapy   Neoadjuvant dose dense Adriamycin and Cytoxan x4 followed  by Taxol and carboplatin (received 8 cycles of Taxol and 3 cycles of carboplatin)   07/29/2018 Surgery   Bilateral mastectomies: Right mastectomy benign, 0/6 lymph nodes; left mastectomy: Benign, pathologic complete response   07/22/2019 Surgery   Total hysterectomy and bilateral salpingoophorectomy     Current Therapy: none. S/p neoadjuvant chemotherapy bilateral mastectomies   Interval history- [name] is a 36 y.o. with oncologic history as above presenting to [facility] today with chief complaint of rash x 4 days. She reports the rash started after spending the weekend at the beach. She admits to wearing sunscreen and have prolonged sun exposure of approximately 4 hours/day. She did start Xarelto while at the beach for a recent diagnosis of a superficial blood clot in her left leg. She denies any other new medications. She reports the rash is located on her arms and legs. The rash itches intensely. She admits to taking ibuprofen for neck pain yesterday and applying hydrocortisone ointment with some improvement. She states she typically does not sunburn after applying sunscreen. She denies any new soap, lotion, detergent. She denies any shortness of breath or chest pain.      ROS  All other systems are reviewed and are negative for acute change except as noted in the HPI.    Allergies  Allergen Reactions   Lidocaine Other (See Comments)    NUMBNESS AND TINGLING ARMS AND LEGS     Past Medical History:  Diagnosis Date   breast ca dx'd 01/2018   right   Cluster headaches    Complication of anesthesia    severe hypotension to the point of syncope   Family history of breast cancer    Family history of colon cancer  Family history of stomach cancer    History of breast cancer 01/2018   right   History of chemotherapy    finished 07/05/2018   History of palpitations    due to low K+   Hyperthyroidism    no current med.   Infection of left breast 11/11/2018   to start  antibiotic 11/11/2018   Irritable bowel syndrome (IBS)    no current med.     Past Surgical History:  Procedure Laterality Date   BREAST RECONSTRUCTION WITH PLACEMENT OF TISSUE EXPANDER AND ALLODERM Bilateral 07/29/2018   Procedure: BILATERAL BREAST RECONSTRUCTION WITH PLACEMENT OF TISSUE EXPANDER AND ALLODERM;  Surgeon: Irene Limbo, MD;  Location: [place];  Service: Plastics;  Laterality: Bilateral;   CESAREAN SECTION     CESAREAN SECTION WITH BILATERAL TUBAL LIGATION Bilateral 11/19/2013   Procedure: REPEAT CESAREAN SECTION WITH BILATERAL TUBAL LIGATION; TWINS;  Surgeon: Lovenia Kim, MD;  Location: [facility];  Service: Obstetrics;  Laterality: Bilateral;  EDD: 12/09/13   CHOLECYSTECTOMY     PORTA CATH REMOVAL Right 11/19/2018   Procedure: PORTA CATH REMOVAL;  Surgeon: Irene Limbo, MD;  Location: [place];  Service: Plastics;  Laterality: Right;   PORTACATH PLACEMENT N/A 02/20/2018   Procedure: INSERTION PORT-A-CATH WITH ULTRASOUND;  Surgeon: Rolm Bookbinder, MD;  Location: [facility];  Service: General;  Laterality: N/A;   REMOVAL OF BILATERAL TISSUE EXPANDERS WITH PLACEMENT OF BILATERAL BREAST IMPLANTS Bilateral 11/19/2018   Procedure: REMOVAL OF BILATERAL TISSUE EXPANDERS WITH PLACEMENT OF BILATERAL BREAST IMPLANTS;  Surgeon: Irene Limbo, MD;  Location: [place];  Service: Plastics;  Laterality: Bilateral;   ROBOTIC ASSISTED TOTAL HYSTERECTOMY WITH BILATERAL SALPINGO OOPHERECTOMY Bilateral 07/22/2019   Procedure: XI ROBOTIC ASSISTED TOTAL HYSTERECTOMY WITH BILATERAL SALPINGO OOPHORECTOMY WITH UMBILICAL HERNIA REPAIR;  Surgeon: Everitt Amber, MD;  Location: [facility];  Service: Gynecology;  Laterality: Bilateral;    Social History   Socioeconomic History   Marital status: Married    Spouse name: Not on file   Number of children: Not on file   Years of education: Not on file   Highest education level:  Not on file  Occupational History   Not on file  Tobacco Use   Smoking status: Former    Types: Cigarettes    Quit date: 12/25/2003    Years since quitting: 18.4   Smokeless tobacco: Never  Vaping Use   Vaping Use: Never used  Substance and Sexual Activity   Alcohol use: No   Drug use: No   Sexual activity: Not Currently  Other Topics Concern   Not on file  Social History Narrative   Not on file   Social Determinants of Health   Financial Resource Strain: Not on file  Food Insecurity: Not on file  Transportation Needs: Not on file  Physical Activity: Not on file  Stress: Not on file  Social Connections: Not on file  Intimate Partner Violence: Not on file    Family History  Problem Relation Age of Onset   Cirrhosis Father    Breast cancer Paternal Aunt 35       BRCA1 p.E908 pos   Heart disease Maternal Grandfather        Heart attack   Breast cancer Paternal Aunt        dx in her 69s   Colon cancer Maternal Aunt        dx in her 58s   Lupus Sister  pat 1/2 sister   Other Brother 48       pat 1/2 brother died in MVA   Lung cancer Maternal Uncle        smoker   Other Paternal Aunt        died in a MVA   Other Paternal Aunt        died from poisioning     Current Outpatient Medications:    famotidine (PEPCID) 20 MG tablet, Take 1 tablet (20 mg total) by mouth 2 (two) times daily for 5 days., Disp: 10 tablet, Rfl: 0   predniSONE (DELTASONE) 10 MG tablet, Take 2 tablets (20 mg total) by mouth daily with breakfast for 5 days., Disp: 10 tablet, Rfl: 0   RIVAROXABAN (XARELTO) VTE STARTER PACK (15 & 20 MG), Follow package directions: Take one 80m tablet by mouth twice a day. On day 22, switch to one 241mtablet once a day. Take with food., Disp: 51 each, Rfl: 0   venlafaxine XR (EFFEXOR-XR) 150 MG 24 hr capsule, Take 1 capsule (150 mg total) by mouth daily with breakfast., Disp: 90 capsule, Rfl: 3  PHYSICAL EXAM: ECOG FS:1 - Symptomatic but completely  ambulatory    Vitals:   06/08/22 1208  BP: 122/83  Pulse: 78  Resp: 16  Temp: 98.7 F (37.1 C)  TempSrc: Oral  SpO2: 99%  Weight: 148 lb 7 oz (67.3 kg)   Physical Exam Vitals and nursing note reviewed.  Constitutional:      Appearance: She is well-developed. She is not ill-appearing or toxic-appearing.  HENT:     Head: Normocephalic and atraumatic.     Nose: Nose normal.  Eyes:     General: No scleral icterus.       Right eye: No discharge.        Left eye: No discharge.     Conjunctiva/sclera: Conjunctivae normal.  Neck:     Vascular: No JVD.  Cardiovascular:     Rate and Rhythm: Normal rate and regular rhythm.     Pulses: Normal pulses.          Radial pulses are 2+ on the right side and 2+ on the left side.     Heart sounds: Normal heart sounds.  Pulmonary:     Effort: Pulmonary effort is normal.     Breath sounds: Normal breath sounds.  Abdominal:     General: There is no distension.  Musculoskeletal:        General: Normal range of motion.     Cervical back: Normal range of motion.     Comments: Compartments soft in all extremities  Skin:    General: Skin is warm and dry.     Comments: Blanching erythema in sporadic pattern on all extremities with mild swelling. No blisters.  Neurological:     Mental Status: She is oriented to person, place, and time.     GCS: GCS eye subscore is 4. GCS verbal subscore is 5. GCS motor subscore is 6.     Comments: Fluent speech, no facial droop.  Psychiatric:        Behavior: Behavior normal.        LABORATORY DATA: I have reviewed the data as listed    Latest Ref Rng & Units 05/31/2022    3:38 PM 09/01/2021   10:39 AM 07/22/2020   12:51 PM  CBC  WBC 4.0 - 10.5 K/uL 6.4  5.2  5.4   Hemoglobin 12.0 - 15.0 g/dL 14.0  13.0  12.6   Hematocrit 36.0 - 46.0 % 40.9  39.4  37.6   Platelets 150 - 400 K/uL 298  254  224         Latest Ref Rng & Units 05/31/2022    3:38 PM 09/01/2021   10:39 AM 07/22/2020   12:51 PM  CMP   Glucose 70 - 99 mg/dL 92  76  83   BUN 6 - 20 mg/dL 8  9  9    Creatinine 0.44 - 1.00 mg/dL 0.64  0.72  0.71   Sodium 135 - 145 mmol/L 141  142  141   Potassium 3.5 - 5.1 mmol/L 3.9  4.2  3.8   Chloride 98 - 111 mmol/L 103  105  105   CO2 22 - 32 mmol/L 30  29  28    Calcium 8.9 - 10.3 mg/dL 10.3  10.1  10.1   Total Protein 6.5 - 8.1 g/dL 8.3  7.3  7.0   Total Bilirubin 0.3 - 1.2 mg/dL 0.5  0.4  0.5   Alkaline Phos 38 - 126 U/L 97  92  70   AST 15 - 41 U/L 24  17  14    ALT 0 - 44 U/L 32  22  13        RADIOGRAPHIC STUDIES: I have personally reviewed the radiological images as listed and agreed with the findings in the report. No images are attached to the encounter. VAS [place] LOWER EXTREMITY VENOUS (DVT)  Result Date: 05/31/2022  Lower Venous DVT Study Patient Name:  AVARIE TAVANO  Date of Exam:   05/31/2022 Medical Rec #: [id number]          Accession #:    [id number] Date of Birth: 04-01-1986           Patient Gender: F Patient Age:   29 years Exam Location:  [facility] Procedure:      VAS [place] LOWER EXTREMITY VENOUS (DVT) Referring Phys: Mendel Ryder CAUSEY --------------------------------------------------------------------------------  Indications: Pain.  Risk Factors: Cancer breast cancer. Performing Technologist: Ralene Cork RVT  Examination Guidelines: A complete evaluation includes B-mode imaging, spectral Doppler, color Doppler, and power Doppler as needed of all accessible portions of each vessel. Bilateral testing is considered an integral part of a complete examination. Limited examinations for reoccurring indications may be performed as noted. The reflux portion of the exam is performed with the patient in reverse Trendelenburg.  +-----+---------------+---------+-----------+----------+--------------+ RIGHTCompressibilityPhasicitySpontaneityPropertiesThrombus Aging +-----+---------------+---------+-----------+----------+--------------+ CFV  Full           Yes       Yes                                 +-----+---------------+---------+-----------+----------+--------------+ SFJ  Full                    Yes                                 +-----+---------------+---------+-----------+----------+--------------+  +---------+---------------+---------+-----------+----------+--------------+ LEFT     CompressibilityPhasicitySpontaneityPropertiesThrombus Aging +---------+---------------+---------+-----------+----------+--------------+ CFV      Full           Yes      Yes                                 +---------+---------------+---------+-----------+----------+--------------+ SFJ  Full                    Yes                                 +---------+---------------+---------+-----------+----------+--------------+ FV Prox  Full           Yes      Yes                                 +---------+---------------+---------+-----------+----------+--------------+ FV Mid   Full           Yes      Yes                                 +---------+---------------+---------+-----------+----------+--------------+ FV DistalFull           Yes      Yes                                 +---------+---------------+---------+-----------+----------+--------------+ POP      Full           Yes      Yes                                 +---------+---------------+---------+-----------+----------+--------------+ PTV      Full                    Yes                                 +---------+---------------+---------+-----------+----------+--------------+ PERO     Full                    Yes                                 +---------+---------------+---------+-----------+----------+--------------+ GSV      Full           Yes      Yes                                 +---------+---------------+---------+-----------+----------+--------------+ SSV      None           No       No                                   +---------+---------------+---------+-----------+----------+--------------+   Findings reported to Dr. Delice Bison at 2:35 pm.  Summary: RIGHT: - No evidence of common femoral vein obstruction.  LEFT: - There is no evidence of deep vein thrombosis in the lower extremity. - Subacute superficial vein thrombus in the small saphenous vein. The sapheno-popliteal junction is patent.  *See table(s) above for measurements and observations. Electronically signed by Servando Snare MD on 05/31/2022 at 4:50:04 PM.    Final      ASSESSMENT & PLAN: Patient is a 36 y.o. female  with oncologic history  of triple negative right breast cancer followed by Dr. Lindi Adie.  I have viewed most recent oncology note and lab work.   #) First degree sunburn - Patient nontoxic appearing. Exam is consistent with a first degree sunburn. No infectious findings. She had spent prolonged time in the sun when she first started Xarelto.  She has significant pruritis with minimal inflammation of the sunburned area. She has an allergy to lidocaine so unable to use OTC topical gel. Will try short course of prednisone and pepcip and recommended OTC benadryl. Discussed with patient she should avoid NSAIDs while taking Xarelto. Also discussed avoiding sun exposure while on Xarelto.   Strict ED precautions discussed should symptoms worsen.   Visit Diagnosis: 1. 1st degree sunburn      No orders of the defined types were placed in this encounter.   All questions were answered. The patient knows to call the clinic with any problems, questions or concerns. No barriers to learning was detected.  I have spent a total of 20 minutes minutes of face-to-face and non-face-to-face time, preparing to see the patient, obtaining and/or reviewing separately obtained history, performing a medically appropriate examination, counseling and educating the patient, documenting clinical information in the electronic health record, and care coordination.     Thank you  for allowing me to participate in the care of this patient.    Barrie Folk, PA-C Department of Hematology/Oncology [facility] at [facility] Phone: [phone]  [phone]    06/08/2022 3:32 PM

## 2022-06-09 ENCOUNTER — Telehealth: Payer: Self-pay | Admitting: Adult Health

## 2022-06-09 NOTE — Telephone Encounter (Signed)
Called and talked with [name] about previous MRI that does not show any concerns for cancer.  She will review it again on Monday with Dr. Lindi Adie during her appointment but I wanted her to have these results before the weekend.  We discussed her rash from the Xarelto due to being out in the sun.  I recommended baby high SPF sunscreen for future outdoor endeavors and that she try to stay under an umbrella.  She will touch base with Dr. Lindi Adie on Monday to discuss the scans and the blood clot and what testing if any should be done moving forward.  [name], NP 06/09/22 4:52 PM Medical Oncology and Hematology [facility], [place] [address] Tel. [phone]    Fax. [phone]

## 2022-06-12 ENCOUNTER — Inpatient Hospital Stay (HOSPITAL_BASED_OUTPATIENT_CLINIC_OR_DEPARTMENT_OTHER): Payer: Self-pay | Admitting: Hematology and Oncology

## 2022-06-12 DIAGNOSIS — C50211 Malignant neoplasm of upper-inner quadrant of right female breast: Secondary | ICD-10-CM

## 2022-06-12 DIAGNOSIS — Z171 Estrogen receptor negative status [ER-]: Secondary | ICD-10-CM

## 2022-06-12 NOTE — Assessment & Plan Note (Signed)
02/01/2018:Screening detected right breast mass in the upper inner quadrant measuring 1.5 cm, with additional enhancement it measured 2.2 cm. No axillary lymph nodes. 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 Genetic counseling and testing 2. neoadjuvant chemotherapy with dose dense Adriamycin and Cytoxan every 2 weeks x4 followed by Taxol weekly x12 (changed to Abraxane due to rash) accompanied by carboplatin every 3 weeks x4completed 07/05/2018 3.Genetics BRCA1 mutation and MLH1 4.07/29/2018: Bilateral mastectomies: Right mastectomy benign, 0/6 lymph nodes; left mastectomy: Benign, pathologic complete response --------------------------------------------------------------------------------------------------------------------------------- Diffuse aches and pains:CTabdomen8/28/2020: Benign no evidence of malignancy.  Surveillance: 1.11/03/2019: Bilateral mastectomies with reconstruction. Slight dimpling of the left breast implant upper outer quadrant. 2.No role of imaging because she had bilateral mastectomies.  Severe fatigue and generalized muscle aches and pains:Intermittently Severe hot flashes: Currently on Effexor and it appears to be helping her but not adequately.  I recommended increasing the dosage to 150 mg daily. 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  02/10/2022: CT CAP at [facility]: Mild hepatic steatosis with subcentimeter liver lesions less well deviated on the current exam Liver MRI 02/23/2022: No change in the multifocal T2 hyperintense liver lesions both sides of the liver compared to 09/01/2021 favored to be multiple cysts.  Recommended a 50-monthfollow-up  Superficial vein thrombosis: Currently on Xarelto I recommended that she use Xarelto for 3  months and then discontinue it.  We discussed the risk factors of blood clots including inherited and acquired risk factors.  Because of her young age we would like to check for hypercoagulability risk factors.

## 2022-06-13 ENCOUNTER — Other Ambulatory Visit: Payer: Self-pay

## 2022-06-13 ENCOUNTER — Inpatient Hospital Stay (HOSPITAL_BASED_OUTPATIENT_CLINIC_OR_DEPARTMENT_OTHER): Payer: Self-pay | Admitting: Hematology and Oncology

## 2022-06-13 ENCOUNTER — Other Ambulatory Visit: Payer: Self-pay | Admitting: Hematology and Oncology

## 2022-06-13 ENCOUNTER — Ambulatory Visit (HOSPITAL_COMMUNITY)
Admission: RE | Admit: 2022-06-13 | Discharge: 2022-06-13 | Disposition: A | Discharge status: Home / Self Care | Payer: Self-pay | Source: Ambulatory Visit | Attending: Hematology and Oncology | Admitting: Hematology and Oncology

## 2022-06-13 ENCOUNTER — Other Ambulatory Visit: Payer: Self-pay | Admitting: *Deleted

## 2022-06-13 DIAGNOSIS — M7989 Other specified soft tissue disorders: Secondary | ICD-10-CM

## 2022-06-13 DIAGNOSIS — Z171 Estrogen receptor negative status [ER-]: Secondary | ICD-10-CM

## 2022-06-13 DIAGNOSIS — C50211 Malignant neoplasm of upper-inner quadrant of right female breast: Secondary | ICD-10-CM

## 2022-06-13 MED ORDER — ALPRAZOLAM 0.25 MG PO TABS: 0.2500 mg | ORAL_TABLET | Freq: Three times a day (TID) | ORAL | 0 refills | Status: DC | PRN

## 2022-06-13 NOTE — Progress Notes (Signed)
Left lower extremity venous duplex has been completed. Preliminary results can be found in CV Proc through chart review.  Results were given to [facility] at Dr. Geralyn Flash office.  06/13/22 12:49 PM [name] RVT

## 2022-06-13 NOTE — Progress Notes (Signed)
Patient Care Team: Sasser, Silvestre Moment, MD as PCP - General (Family Medicine) Irene Limbo, MD as Consulting Physician (Plastic Surgery) Delice Bison, Charlestine Massed, NP as Nurse Practitioner (Hematology and Oncology) Nicholas Lose, MD as Consulting Physician (Hematology and Oncology) Rolm Bookbinder, MD as Consulting Physician (General Surgery)  DIAGNOSIS:  Encounter Diagnosis  Name Primary?   Malignant neoplasm of upper-inner quadrant of right breast in female, estrogen receptor negative ([place])     SUMMARY OF ONCOLOGIC HISTORY: Oncology History  Malignant neoplasm of upper-inner quadrant of right breast in female, estrogen receptor negative ([place])  02/01/2018 Initial Diagnosis   Screening detected right breast mass in the upper inner quadrant measuring 1.5 cm, with additional enhancement it measured 2.2 cm.  No axillary lymph nodes.  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  The Hereditary Gene Panel offered by [facility] includes sequencing and/or deletion duplication testing of the following 47 genes: APC, ATM, AXIN2, BARD1, BMPR1A, BRCA1, BRCA2, BRIP1, CDH1, CDK4, CDKN2A (p14ARF), CDKN2A (p16INK4a), CHEK2, CTNNA1, DICER1, EPCAM (Deletion/duplication testing only), GREM1 (promoter region deletion/duplication testing only), KIT, MEN1, MLH1, MSH2, MSH3, MSH6, MUTYH, NBN, NF1, NHTL1, PALB2, PDGFRA, PMS2, POLD1, POLE, PTEN, RAD50, RAD51C, RAD51D, SDHB, SDHC, SDHD, SMAD4, SMARCA4. STK11, TP53, TSC1, TSC2, and VHL.  The following genes were evaluated for sequence changes only: SDHA and HOXB13 c.251G>A variant only. The report date is February 20, 2018.    02/20/2018 - 07/05/2018 Neo-Adjuvant Chemotherapy   Neoadjuvant dose dense Adriamycin and Cytoxan x4 followed by Taxol and  carboplatin (received 8 cycles of Taxol and 3 cycles of carboplatin)   07/29/2018 Surgery   Bilateral mastectomies: Right mastectomy benign, 0/6 lymph nodes; left mastectomy: Benign, pathologic complete response   07/22/2019 Surgery   Total hysterectomy and bilateral salpingoophorectomy     CHIEF COMPLIANT: Emergency room visit because of throbbing in the leg  INTERVAL HISTORY: [name] is a 36 year old with above-mentioned history of breast cancer who was diagnosed with superficial vein thrombosis and is currently on Xarelto.  She developed throbbing pain in the leg last night and went to the emergency room after 4 hours they did not have an ultrasound available so she was discharged home.  She came in today for ultrasound of the leg this morning which was negative for any DVT.  She came in today to discuss all of these processes and concerns with [place].  She also had some palpitations last night.   ALLERGIES:  is allergic to lidocaine.  MEDICATIONS:  Current Outpatient Medications  Medication Sig Dispense Refill   RIVAROXABAN (XARELTO) VTE STARTER PACK (15 & 20 MG) Follow package directions: Take one 56m tablet by mouth twice a day. On day 22, switch to one 261mtablet once a day. Take with food. 51 each 0   venlafaxine XR (EFFEXOR-XR) 150 MG 24 hr capsule Take 1 capsule (150 mg total) by mouth daily with breakfast. 90 capsule 3   No current facility-administered medications for this visit.    PHYSICAL EXAMINATION: ECOG PERFORMANCE STATUS: 1 - Symptomatic but completely ambulatory  Vitals:   06/13/22 1323  BP: 117/81  Pulse: 85  Resp: 18  Temp: 97.9 F (36.6 C)  SpO2: 99%   Filed Weights   06/13/22 1323  Weight: 149 lb 12.8 oz (67.9 kg)  LABORATORY DATA:  I have reviewed the data as listed    Latest Ref Rng & Units 05/31/2022    3:38 PM 09/01/2021   10:39 AM 07/22/2020   12:51 PM  CMP  Glucose 70 - 99 mg/dL 92  76  83   BUN 6 - 20 mg/dL 8  9  9    Creatinine  0.44 - 1.00 mg/dL 0.64  0.72  0.71   Sodium 135 - 145 mmol/L 141  142  141   Potassium 3.5 - 5.1 mmol/L 3.9  4.2  3.8   Chloride 98 - 111 mmol/L 103  105  105   CO2 22 - 32 mmol/L 30  29  28    Calcium 8.9 - 10.3 mg/dL 10.3  10.1  10.1   Total Protein 6.5 - 8.1 g/dL 8.3  7.3  7.0   Total Bilirubin 0.3 - 1.2 mg/dL 0.5  0.4  0.5   Alkaline Phos 38 - 126 U/L 97  92  70   AST 15 - 41 U/L 24  17  14    ALT 0 - 44 U/L 32  22  13     Lab Results  Component Value Date   WBC 6.4 05/31/2022   HGB 14.0 05/31/2022   HCT 40.9 05/31/2022   MCV 86.1 05/31/2022   PLT 298 05/31/2022   NEUTROABS 4.3 05/31/2022    ASSESSMENT & PLAN:  Malignant neoplasm of upper-inner quadrant of right breast in female, estrogen receptor negative ([place]) 02/01/2018:Screening detected right breast mass in the upper inner quadrant measuring 1.5 cm, with additional enhancement it measured 2.2 cm.  No axillary lymph nodes.  Biopsy revealed IDC with DCIS, grade 3, ER 0%, PR 0%, HER-2 negative, Ki-67 80%, T1CN0 stage Ib clinical stage   Recommendation: 1.  Genetic counseling and testing 2. neoadjuvant chemotherapy with dose dense Adriamycin and Cytoxan every 2 weeks x4 followed by Taxol weekly x12 (changed to Abraxane due to rash)  accompanied by carboplatin every 3 weeks x4 completed 07/05/2018 3.  Genetics BRCA1 mutation and MLH1 4.  07/29/2018: Bilateral mastectomies: Right mastectomy benign, 0/6 lymph nodes; left mastectomy: Benign, pathologic complete response --------------------------------------------------------------------------------------------------------------------------------- Diffuse aches and pains: CT abdomen 08/22/2019: Benign no evidence of malignancy.   Surveillance: 1.  11/03/2019: Bilateral mastectomies with reconstruction.  Slight dimpling of the left breast implant upper outer quadrant. 2.  No role of imaging because she had bilateral mastectomies.   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   Patient decided not to take adjuvant olaparib.  02/10/2022: CT CAP [facility]: Mild hepatic steatosis with subcentimeter liver lesions less well deviated on the current exam Liver MRI 02/23/2022: No change in the multifocal T2 hyperintense liver lesions both sides of the liver compared to 09/01/2021 favored to be multiple cysts.  Recommended a 48-monthfollow-up  Superficial vein thrombosis: Currently on Xarelto I recommended that she use Xarelto for 3 months and then discontinue it.  We discussed the risk factors of blood clots including inherited and acquired risk factors.  Because of her young age we would like to check for hypercoagulability risk factors.  When she gets her insurance back she would like to get the hypercoagulability work-up performed. She would also like to wait for insurance to come on so that she can do the whole-body CT scans.   I instructed her that most of her concerns are related to  stress and anxiety and panic encouraged her to go for laxation techniques and even suggested that she can take an antianxiety medication.   No orders of the defined types were placed in this encounter.  The patient has a good understanding of the overall plan. she agrees with it. she will call with any problems that may develop before the next visit here. Total time spent: 30 mins including face to face time and time spent for planning, charting and co-ordination of care   Harriette Ohara, MD 06/13/22

## 2022-06-13 NOTE — Assessment & Plan Note (Signed)
02/01/2018:Screening detected right breast mass in the upper inner quadrant measuring 1.5 cm, with additional enhancement it measured 2.2 cm. No axillary lymph nodes. 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 Genetic counseling and testing 2. neoadjuvant chemotherapy with dose dense Adriamycin and Cytoxan every 2 weeks x4 followed by Taxol weekly x12 (changed to Abraxane due to rash) accompanied by carboplatin every 3 weeks x4completed 07/05/2018 3.Genetics BRCA1 mutation and MLH1 4.07/29/2018: Bilateral mastectomies: Right mastectomy benign, 0/6 lymph nodes; left mastectomy: Benign, pathologic complete response --------------------------------------------------------------------------------------------------------------------------------- Diffuse aches and pains:CTabdomen8/28/2020: Benign no evidence of malignancy.  Surveillance: 1.11/03/2019: Bilateral mastectomies with reconstruction. Slight dimpling of the left breast implant upper outer quadrant. 2.No role of imaging because she had bilateral mastectomies.  Severe fatigue and generalized muscle aches and pains:Intermittently Severe hot flashes: Currently on Effexor and it appears to be helping her but not adequately.  I recommended increasing the dosage to 150 mg daily. 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  02/10/2022: CT CAP at [facility]: Mild hepatic steatosis with subcentimeter liver lesions less well deviated on the current exam Liver MRI 02/23/2022: No change in the multifocal T2 hyperintense liver lesions both sides of the liver compared to 09/01/2021 favored to be multiple cysts.  Recommended a 42-monthfollow-up  Superficial vein thrombosis: Currently on Xarelto I recommended that she use Xarelto for 3  months and then discontinue it.  We discussed the risk factors of blood clots including inherited and acquired risk factors.  Because of her young age we would like to check for hypercoagulability risk factors.

## 2022-07-06 ENCOUNTER — Other Ambulatory Visit: Payer: Self-pay | Admitting: Adult Health

## 2022-07-06 DIAGNOSIS — Z171 Estrogen receptor negative status [ER-]: Secondary | ICD-10-CM

## 2022-07-07 ENCOUNTER — Other Ambulatory Visit (HOSPITAL_COMMUNITY): Payer: Self-pay

## 2022-07-07 ENCOUNTER — Other Ambulatory Visit: Payer: Self-pay | Admitting: *Deleted

## 2022-07-07 MED ORDER — RIVAROXABAN 20 MG PO TABS: 20.0000 mg | ORAL_TABLET | Freq: Every day | ORAL | 2 refills | Status: DC

## 2022-09-01 NOTE — Progress Notes (Signed)
Patient Care Team: Sasser, Silvestre Moment, MD as PCP - General (Family Medicine) Irene Limbo, MD as Consulting Physician (Plastic Surgery) Delice Bison, Charlestine Massed, NP as Nurse Practitioner (Hematology and Oncology) Nicholas Lose, MD as Consulting Physician (Hematology and Oncology) Rolm Bookbinder, MD as Consulting Physician (General Surgery)  DIAGNOSIS: No diagnosis found.  SUMMARY OF ONCOLOGIC HISTORY: Oncology History  Malignant neoplasm of upper-inner quadrant of right breast in female, estrogen receptor negative ([place])  02/01/2018 Initial Diagnosis   Screening detected right breast mass in the upper inner quadrant measuring 1.5 cm, with additional enhancement it measured 2.2 cm.  No axillary lymph nodes.  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  The Hereditary Gene Panel offered by [facility] includes sequencing and/or deletion duplication testing of the following 47 genes: APC, ATM, AXIN2, BARD1, BMPR1A, BRCA1, BRCA2, BRIP1, CDH1, CDK4, CDKN2A (p14ARF), CDKN2A (p16INK4a), CHEK2, CTNNA1, DICER1, EPCAM (Deletion/duplication testing only), GREM1 (promoter region deletion/duplication testing only), KIT, MEN1, MLH1, MSH2, MSH3, MSH6, MUTYH, NBN, NF1, NHTL1, PALB2, PDGFRA, PMS2, POLD1, POLE, PTEN, RAD50, RAD51C, RAD51D, SDHB, SDHC, SDHD, SMAD4, SMARCA4. STK11, TP53, TSC1, TSC2, and VHL.  The following genes were evaluated for sequence changes only: SDHA and HOXB13 c.251G>A variant only. The report date is February 20, 2018.    02/20/2018 - 07/05/2018 Neo-Adjuvant Chemotherapy   Neoadjuvant dose dense Adriamycin and Cytoxan x4 followed by Taxol and carboplatin (received 8 cycles of Taxol and 3 cycles of carboplatin)   07/29/2018 Surgery   Bilateral mastectomies: Right  mastectomy benign, 0/6 lymph nodes; left mastectomy: Benign, pathologic complete response   07/22/2019 Surgery   Total hysterectomy and bilateral salpingoophorectomy     CHIEF COMPLIANT: Follow-up on Xer  INTERVAL HISTORY: [name] is a 36 year old with above-mentioned history of breast cancer who was diagnosed with superficial vein thrombosis and is currently on Xarelto. She presents to the clinic today for a follow-up.    ALLERGIES:  is allergic to lidocaine.  MEDICATIONS:  Current Outpatient Medications  Medication Sig Dispense Refill   rivaroxaban (XARELTO) 20 MG TABS tablet Take 1 tablet (20 mg total) by mouth daily with supper. 30 tablet 2   venlafaxine XR (EFFEXOR-XR) 150 MG 24 hr capsule Take 1 capsule (150 mg total) by mouth daily with breakfast. 90 capsule 3   No current facility-administered medications for this visit.    PHYSICAL EXAMINATION: ECOG PERFORMANCE STATUS: {CHL ONC ECOG [phone]}  There were no vitals filed for this visit. There were no vitals filed for this visit.  BREAST:*** No palpable masses or nodules in either right or left breasts. No palpable axillary supraclavicular or infraclavicular adenopathy no breast tenderness or nipple discharge. (exam performed in the presence of a chaperone)  LABORATORY DATA:  I have reviewed the data as listed    Latest Ref Rng & Units 05/31/2022    3:38 PM 09/01/2021   10:39 AM 07/22/2020   12:51 PM  CMP  Glucose 70 - 99 mg/dL 92  76  83   BUN 6 - 20 mg/dL 8  9  9    Creatinine 0.44 - 1.00 mg/dL 0.64  0.72  0.71   Sodium 135 - 145 mmol/L 141  142  141   Potassium 3.5 - 5.1 mmol/L 3.9  4.2  3.8   Chloride 98 -  111 mmol/L 103  105  105   CO2 22 - 32 mmol/L 30  29  28    Calcium 8.9 - 10.3 mg/dL 10.3  10.1  10.1   Total Protein 6.5 - 8.1 g/dL 8.3  7.3  7.0   Total Bilirubin 0.3 - 1.2 mg/dL 0.5  0.4  0.5   Alkaline Phos 38 - 126 U/L 97  92  70   AST 15 - 41 U/L 24  17  14    ALT 0 - 44 U/L 32  22  13      Lab Results  Component Value Date   WBC 6.4 05/31/2022   HGB 14.0 05/31/2022   HCT 40.9 05/31/2022   MCV 86.1 05/31/2022   PLT 298 05/31/2022   NEUTROABS 4.3 05/31/2022    ASSESSMENT & PLAN:  No problem-specific Assessment & Plan notes found for this encounter.    No orders of the defined types were placed in this encounter.  The patient has a good understanding of the overall plan. she agrees with it. she will call with any problems that may develop before the next visit here. Total time spent: 30 mins including face to face time and time spent for planning, charting and co-ordination of care   Suzzette Righter, [place] 09/01/22    I Gardiner Coins am scribing for Dr. Lindi Adie  ***

## 2022-09-05 ENCOUNTER — Inpatient Hospital Stay: Payer: BC Managed Care – PPO

## 2022-09-05 ENCOUNTER — Ambulatory Visit (HOSPITAL_BASED_OUTPATIENT_CLINIC_OR_DEPARTMENT_OTHER)
Admission: RE | Admit: 2022-09-05 | Discharge: 2022-09-05 | Disposition: A | Discharge status: Home / Self Care | Payer: BC Managed Care – PPO | Source: Ambulatory Visit | Attending: Hematology and Oncology | Admitting: Hematology and Oncology

## 2022-09-05 ENCOUNTER — Other Ambulatory Visit: Payer: Self-pay | Admitting: *Deleted

## 2022-09-05 ENCOUNTER — Other Ambulatory Visit: Payer: Self-pay

## 2022-09-05 ENCOUNTER — Inpatient Hospital Stay: Payer: BC Managed Care – PPO | Attending: Hematology and Oncology | Admitting: Hematology and Oncology

## 2022-09-05 ENCOUNTER — Telehealth: Payer: Self-pay | Admitting: *Deleted

## 2022-09-05 VITALS — BP 132/78 | HR 89 | Temp 97.9°F | Resp 18 | Ht 60.0 in | Wt 152.6 lb

## 2022-09-05 DIAGNOSIS — C50211 Malignant neoplasm of upper-inner quadrant of right female breast: Secondary | ICD-10-CM | POA: Diagnosis present

## 2022-09-05 DIAGNOSIS — R21 Rash and other nonspecific skin eruption: Secondary | ICD-10-CM

## 2022-09-05 DIAGNOSIS — Z171 Estrogen receptor negative status [ER-]: Secondary | ICD-10-CM

## 2022-09-05 DIAGNOSIS — I82812 Embolism and thrombosis of superficial veins of left lower extremities: Secondary | ICD-10-CM

## 2022-09-05 DIAGNOSIS — Z9013 Acquired absence of bilateral breasts and nipples: Secondary | ICD-10-CM | POA: Insufficient documentation

## 2022-09-05 DIAGNOSIS — Z79899 Other long term (current) drug therapy: Secondary | ICD-10-CM | POA: Insufficient documentation

## 2022-09-05 DIAGNOSIS — Z7901 Long term (current) use of anticoagulants: Secondary | ICD-10-CM | POA: Diagnosis not present

## 2022-09-05 DIAGNOSIS — I82819 Embolism and thrombosis of superficial veins of unspecified lower extremities: Secondary | ICD-10-CM | POA: Diagnosis not present

## 2022-09-05 NOTE — Progress Notes (Signed)
Left lower extremity venous duplex has been completed. Preliminary results can be found in CV Proc through chart review.  Results were given to [facility] at Dr. Geralyn Flash office.  09/05/22 11:36 AM Carlos Levering RVT

## 2022-09-05 NOTE — Assessment & Plan Note (Addendum)
/  07/2018:Screening detected right breast mass in the upper inner quadrant measuring 1.5 cm, with additional enhancement it measured 2.2 cm. No axillary lymph nodes. 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 Genetic counseling and testing 2. neoadjuvant chemotherapy with dose dense Adriamycin and Cytoxan every 2 weeks x4 followed by Taxol weekly x12 (changed to Abraxane due to rash) accompanied by carboplatin every 3 weeks x4completed 07/05/2018 3.GeneticsBRCA1 mutation and MLH1 4.07/29/2018: Bilateral mastectomies: Right mastectomy benign, 0/6 lymph nodes; left mastectomy: Benign, pathologic complete response --------------------------------------------------------------------------------------------------------------------------------- Diffuse aches and pains:CTabdomen8/28/2020: Benign no evidence of malignancy.  Surveillance: 1.11/03/2019: Bilateral mastectomies with reconstruction. Slight dimpling of the left breast implant upper outer quadrant. 2.No role of imaging because she had bilateral mastectomies.  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  02/10/2022: CT CAP [facility]: Mild hepatic steatosis with subcentimeter liver lesions less well deviated on the current exam Liver MRI 02/23/2022: No change in the multifocal T2 hyperintense liver lesions both sides of the liver compared to 09/01/2021 favored to be multiple cysts.  Recommended a 34-monthfollow-up  Superficial vein thrombosis: Completed 3 months of Xarelto Ultrasound left leg 09/05/2022: Negative for blood clot.  She cannot discontinue Xarelto.  Signatera testing: I discussed with her about pros and cons Signatera and we will set her up for the testing.

## 2022-09-05 NOTE — Telephone Encounter (Signed)
Per MD pt left LE VAS [place] negative for DVT and negative for superficial thrombus.  MD requesting pt to finish current prescription of Xarelto and then pt can stop this anticoagulation. RN attempt x1 to contact pt with information.  No answer, LVM for pt to return call to the office.

## 2022-09-05 NOTE — Progress Notes (Signed)
Per MD request RN successfully faxed signatera request [phone]).

## 2022-09-06 LAB — DRVVT CONFIRM: dRVVT Confirm: 1.9 ratio — ABNORMAL HIGH (ref 0.8–1.2)

## 2022-09-06 LAB — PROTEIN C, TOTAL: Protein C, Total: 175 % — ABNORMAL HIGH (ref 60–150)

## 2022-09-06 LAB — LUPUS ANTICOAGULANT PANEL
DRVVT: 111.2 s — ABNORMAL HIGH (ref 0.0–47.0)
PTT Lupus Anticoagulant: 50.6 s — ABNORMAL HIGH (ref 0.0–43.5)

## 2022-09-06 LAB — ANTITHROMBIN III ANTIGEN: AT III AG PPP IMM-ACNC: 109 % (ref 72–124)

## 2022-09-06 LAB — HEXAGONAL PHASE PHOSPHOLIPID: Hexagonal Phase Phospholipid: 8 s (ref 0–11)

## 2022-09-06 LAB — DRVVT MIX: dRVVT Mix: 67.2 s — ABNORMAL HIGH (ref 0.0–40.4)

## 2022-09-06 LAB — PTT-LA MIX: PTT-LA Mix: 44.8 s — ABNORMAL HIGH (ref 0.0–40.5)

## 2022-09-06 LAB — PROTEIN S, TOTAL: Protein S Ag, Total: 106 % (ref 60–150)

## 2022-09-07 ENCOUNTER — Telehealth: Payer: Self-pay

## 2022-09-07 NOTE — Telephone Encounter (Signed)
Pt called and LVM regarding concern for her lab results from 09/05/22. MD notified and he states he will call her to discuss.

## 2022-09-08 ENCOUNTER — Telehealth: Payer: Self-pay

## 2022-09-08 NOTE — Telephone Encounter (Signed)
Pt called and LVM to discuss lab results. Returned pt's call and LVM to make her aware MD called her yesterday and left her a VM explaining her results. Advised pt return our call.

## 2022-09-08 NOTE — Telephone Encounter (Signed)
Pt returned call and had many questions regarding results. She states she did not have a VM from MD. She requests a telephone visit with him. Added for Monday 9/18 at 1145. Pt is aware I will be in touch in case we need to change appt, as her ANA and factor V are not back yet. She verbalized thanks and understanding

## 2022-09-09 LAB — ANTINUCLEAR ANTIBODIES, IFA: ANA Ab, IFA: NEGATIVE

## 2022-09-11 ENCOUNTER — Inpatient Hospital Stay (HOSPITAL_BASED_OUTPATIENT_CLINIC_OR_DEPARTMENT_OTHER): Payer: BC Managed Care – PPO | Admitting: Hematology and Oncology

## 2022-09-11 DIAGNOSIS — I82890 Acute embolism and thrombosis of other specified veins: Secondary | ICD-10-CM | POA: Insufficient documentation

## 2022-09-11 LAB — PROTHROMBIN GENE MUTATION

## 2022-09-11 LAB — FACTOR 5 LEIDEN

## 2022-09-11 NOTE — Progress Notes (Signed)
HEMATOLOGY-ONCOLOGY TELEPHONE VISIT PROGRESS NOTE  I connected with our patient on 09/11/22 at 11:45 AM EDT by telephone and verified that I am speaking with the correct person using two identifiers.  I discussed the limitations, risks, security and privacy concerns of performing an evaluation and management service by telephone and the availability of in person appointments.  I also discussed with the patient that there may be a patient responsible charge related to this service. The patient expressed understanding and agreed to proceed.   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  She had blood work for hypercoagulability and the purpose of this phone call us to discuss results.  Oncology History  Malignant neoplasm of upper-inner quadrant of right breast in female, estrogen receptor negative ([place])  02/01/2018 Initial Diagnosis   Screening detected right breast mass in the upper inner quadrant measuring 1.5 cm, with additional enhancement it measured 2.2 cm.  No axillary lymph nodes.  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  The Hereditary Gene Panel offered by [facility] includes sequencing and/or deletion duplication testing of the following 47 genes: APC, ATM, AXIN2, BARD1, BMPR1A, BRCA1, BRCA2, BRIP1, CDH1, CDK4, CDKN2A (p14ARF), CDKN2A (p16INK4a), CHEK2, CTNNA1, DICER1, EPCAM (Deletion/duplication testing only), GREM1 (promoter region deletion/duplication testing only), KIT, MEN1, MLH1, MSH2, MSH3, MSH6, MUTYH, NBN, NF1, NHTL1, PALB2, PDGFRA, PMS2, POLD1, POLE, PTEN, RAD50, RAD51C, RAD51D, SDHB, SDHC, SDHD, SMAD4, SMARCA4. STK11, TP53, TSC1, TSC2, and VHL.  The following genes were evaluated for  sequence changes only: SDHA and HOXB13 c.251G>A variant only. The report date is February 20, 2018.    02/20/2018 - 07/05/2018 Neo-Adjuvant Chemotherapy   Neoadjuvant dose dense Adriamycin and Cytoxan x4 followed by Taxol and carboplatin (received 8 cycles of Taxol and 3 cycles of carboplatin)   07/29/2018 Surgery   Bilateral mastectomies: Right mastectomy benign, 0/6 lymph nodes; left mastectomy: Benign, pathologic complete response   07/22/2019 Surgery   Total hysterectomy and bilateral salpingoophorectomy     REVIEW OF SYSTEMS:   Constitutional: Denies fevers, chills or abnormal weight loss All other systems were reviewed with the patient and are negative. Observations/Objective:     Assessment Plan:  Superficial vein thrombosis Completed 3 months of Xarelto Ultrasound left leg 09/05/2022: Negative for blood clot.  Hypercoagulability panel: Only positive for antiphospholipid antibody ANA: Negative Also negative for Antithrombin III, protein S, protein C  Discussion: I discussed with the patient about the significance of the positive antibody for antiphospholipid antibody syndrome.  This will need to be repeated again in 3 months to confirm the diagnosis and if only persistently positive then she will need lifelong anticoagulation.  Body Pains diffusely: I recommended her participation in [place] live strong program.  I suspect that deconditioning is making it difficult for her to get back to her full activity levels.  Return to clinic in 3 months with labs and then follow-up after that.   I discussed the assessment and treatment plan with the patient. The patient was provided an opportunity to ask questions and all were answered. The patient agreed with the plan and demonstrated an understanding of the instructions. The patient was advised to call back or seek an in-person evaluation if the symptoms worsen or if  the condition fails to improve as anticipated.   I provided 12 minutes of  non-face-to-face time during this encounter.  This includes time for charting and coordination of care   Harriette Ohara, MD

## 2022-09-11 NOTE — Assessment & Plan Note (Signed)
Completed 3 months of Xarelto Ultrasound left leg 09/05/2022: Negative for blood clot.  Hypercoagulability panel: Only positive for antiphospholipid antibody ANA: Negative Also negative for Antithrombin III, protein S, protein C  Discussion: I discussed with the patient about the significance of the positive antibody for antiphospholipid antibody syndrome.  This will need to be repeated again in 3 months to confirm the diagnosis and if only persistently positive then she will need lifelong anticoagulation.  Return to clinic in 3 months with labs and then follow-up after that.

## 2022-09-26 ENCOUNTER — Other Ambulatory Visit: Payer: Self-pay | Admitting: *Deleted

## 2022-09-26 DIAGNOSIS — M7989 Other specified soft tissue disorders: Secondary | ICD-10-CM

## 2022-09-26 NOTE — Progress Notes (Signed)
Received call from pt with complaint of left knee pain and swelling x 4 days.  Pt states she has a hx of DVT left leg and is concerned for recurrence.  Per MD pt needing VAS [place] to r/o DVT in left leg.  Orders placed, appt scheduled and pt verbalized understanding of appt date and time.

## 2022-09-27 ENCOUNTER — Ambulatory Visit (HOSPITAL_COMMUNITY)
Admission: RE | Admit: 2022-09-27 | Discharge: 2022-09-27 | Disposition: A | Discharge status: Home / Self Care | Payer: BC Managed Care – PPO | Source: Ambulatory Visit | Attending: Hematology and Oncology | Admitting: Hematology and Oncology

## 2022-09-27 ENCOUNTER — Inpatient Hospital Stay: Payer: BC Managed Care – PPO | Attending: Hematology and Oncology | Admitting: Hematology and Oncology

## 2022-09-27 ENCOUNTER — Other Ambulatory Visit: Payer: Self-pay

## 2022-09-27 DIAGNOSIS — M7989 Other specified soft tissue disorders: Secondary | ICD-10-CM | POA: Diagnosis not present

## 2022-09-27 DIAGNOSIS — Z171 Estrogen receptor negative status [ER-]: Secondary | ICD-10-CM | POA: Insufficient documentation

## 2022-09-27 DIAGNOSIS — M25561 Pain in right knee: Secondary | ICD-10-CM | POA: Diagnosis not present

## 2022-09-27 DIAGNOSIS — Z9013 Acquired absence of bilateral breasts and nipples: Secondary | ICD-10-CM | POA: Diagnosis not present

## 2022-09-27 DIAGNOSIS — M25562 Pain in left knee: Secondary | ICD-10-CM | POA: Diagnosis not present

## 2022-09-27 DIAGNOSIS — C50211 Malignant neoplasm of upper-inner quadrant of right female breast: Secondary | ICD-10-CM | POA: Diagnosis present

## 2022-09-27 NOTE — Assessment & Plan Note (Signed)
/  07/2018:Screening detected right breast mass in the upper inner quadrant measuring 1.5 cm, with additional enhancement it measured 2.2 cm. No axillary lymph nodes. 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 Genetic counseling and testing 2. neoadjuvant chemotherapy with dose dense Adriamycin and Cytoxan every 2 weeks x4 followed by Taxol weekly x12 (changed to Abraxane due to rash) accompanied by carboplatin every 3 weeks x4completed 07/05/2018 3.GeneticsBRCA1 mutation and MLH1 4.07/29/2018: Bilateral mastectomies: Right mastectomy benign, 0/6 lymph nodes; left mastectomy: Benign, pathologic complete response --------------------------------------------------------------------------------------------------------------------------------- Diffuse aches and pains:CTabdomen8/28/2020: Benign no evidence of malignancy.  Surveillance: 1.11/03/2019: Bilateral mastectomies with reconstruction. Slight dimpling of the left breast implant upper outer quadrant. 2.No role of imaging because she had bilateral mastectomies.  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  02/10/2022: CT CAP at [facility]: Mild hepatic steatosis with subcentimeter liver lesions less well deviated on the current exam Liver MRI 02/23/2022: No change in the multifocal T2 hyperintense liver lesions both sides of the liver compared to 09/01/2021 favored to be multiple cysts.  Recommended a 90-month follow-up Liver MRI 06/09/2022: Stable, 52-month follow-up recommended  Superficial vein thrombosis: Completed 3 months of Xarelto Ultrasound left leg 09/05/2022: Negative for blood clot.   Hypercoagulability work-up: Positive for lupus anticoagulant on 09/05/2022 (this needs to be repeated in  3 months)  Signatera testing: I discussed with her about pros and cons Signatera and we will set her up for the testing.

## 2022-09-27 NOTE — Progress Notes (Signed)
Patient Care Team: Nicholas Lose, MD as PCP - General (Hematology and Oncology) Irene Limbo, MD as Consulting Physician (Plastic Surgery) Delice Bison, Charlestine Massed, NP as Nurse Practitioner (Hematology and Oncology) Nicholas Lose, MD as Consulting Physician (Hematology and Oncology) Rolm Bookbinder, MD as Consulting Physician (General Surgery)  DIAGNOSIS:  Encounter Diagnosis  Name Primary?   Malignant neoplasm of upper-inner quadrant of right breast in female, estrogen receptor negative ([place])     SUMMARY OF ONCOLOGIC HISTORY: Oncology History  Malignant neoplasm of upper-inner quadrant of right breast in female, estrogen receptor negative ([place])  02/01/2018 Initial Diagnosis   Screening detected right breast mass in the upper inner quadrant measuring 1.5 cm, with additional enhancement it measured 2.2 cm.  No axillary lymph nodes.  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  The Hereditary Gene Panel offered by [facility] includes sequencing and/or deletion duplication testing of the following 47 genes: APC, ATM, AXIN2, BARD1, BMPR1A, BRCA1, BRCA2, BRIP1, CDH1, CDK4, CDKN2A (p14ARF), CDKN2A (p16INK4a), CHEK2, CTNNA1, DICER1, EPCAM (Deletion/duplication testing only), GREM1 (promoter region deletion/duplication testing only), KIT, MEN1, MLH1, MSH2, MSH3, MSH6, MUTYH, NBN, NF1, NHTL1, PALB2, PDGFRA, PMS2, POLD1, POLE, PTEN, RAD50, RAD51C, RAD51D, SDHB, SDHC, SDHD, SMAD4, SMARCA4. STK11, TP53, TSC1, TSC2, and VHL.  The following genes were evaluated for sequence changes only: SDHA and HOXB13 c.251G>A variant only. The report date is February 20, 2018.    02/20/2018 - 07/05/2018 Neo-Adjuvant Chemotherapy   Neoadjuvant dose dense Adriamycin and Cytoxan x4 followed by  Taxol and carboplatin (received 8 cycles of Taxol and 3 cycles of carboplatin)   07/29/2018 Surgery   Bilateral mastectomies: Right mastectomy benign, 0/6 lymph nodes; left mastectomy: Benign, pathologic complete response   07/22/2019 Surgery   Total hysterectomy and bilateral salpingoophorectomy     CHIEF COMPLIANT: Follow-up right breast cancer  INTERVAL HISTORY: [name] is a 36 y.o. with the above-mentioned. She presents to the clinic for labs and follow-up. She reports swelling in the left knee but it's not painful. She states that when she does thing both of her knees hurt.  She is trying to good more active with volleyball and basketball and working in [employer].  She unfortunately is starting to have bilateral knee pains which is causing her to limit some of these activities.  Because of this she had to undergo another ultrasound to rule out a blood clot.  Was no evidence of blood clots.  She came here along with her mother to talk about what can be done about her symptoms.    ALLERGIES:  is allergic to lidocaine.  MEDICATIONS:  Current Outpatient Medications  Medication Sig Dispense Refill   venlafaxine XR (EFFEXOR-XR) 150 MG 24 hr capsule Take 1 capsule (150 mg total) by mouth daily with breakfast. 90 capsule 3   No current facility-administered medications for this visit.    PHYSICAL EXAMINATION: ECOG PERFORMANCE STATUS: 1 - Symptomatic but completely ambulatory  Vitals:   09/27/22 1323  BP: 125/83  Pulse: 87  Resp: 18  Temp: 97.8 F (36.6 C)  SpO2: 99%   Filed Weights   09/27/22 1323  Weight: 151 lb 12.8 oz (68.9 kg)      LABORATORY DATA:  I have reviewed the data as listed    Latest Ref Rng &  Units 05/31/2022    3:38 PM 09/01/2021   10:39 AM 07/22/2020   12:51 PM  CMP  Glucose 70 - 99 mg/dL 92  76  83   BUN 6 - 20 mg/dL _0 Creatinine 0.44 - 1.00 mg/dL 0.64  0.72  0.71   Sodium 135 - 145 mmol/L 141  142  141   Potassium 3.5 - 5.1 mmol/L  3.9  4.2  3.8   Chloride 98 - 111 mmol/L 103  105  105   CO2 22 - 32 mmol/L _1 Calcium 8.9 - 10.3 mg/dL 10.3  10.1  10.1   Total Protein 6.5 - 8.1 g/dL 8.3  7.3  7.0   Total Bilirubin 0.3 - 1.2 mg/dL 0.5  0.4  0.5   Alkaline Phos 38 - 126 U/L 97  92  70   AST 15 - 41 U/L _2 ALT 0 - 44 U/L 32  22  13     Lab Results  Component Value Date   WBC 6.4 05/31/2022   HGB 14.0 05/31/2022   HCT 40.9 05/31/2022   MCV 86.1 05/31/2022   PLT 298 05/31/2022   NEUTROABS 4.3 05/31/2022    ASSESSMENT & PLAN:  Malignant neoplasm of upper-inner quadrant of right breast in female, estrogen receptor negative ([place]) /07/2018:Screening detected right breast mass in the upper inner quadrant measuring 1.5 cm, with additional enhancement it measured 2.2 cm.  No axillary lymph nodes.  Biopsy revealed IDC with DCIS, grade 3, ER 0%, PR 0%, HER-2 negative, Ki-67 80%, T1CN0 stage Ib clinical stage   Recommendation: 1.  Genetic counseling and testing 2. neoadjuvant chemotherapy with dose dense Adriamycin and Cytoxan every 2 weeks x4 followed by Taxol weekly x12 (changed to Abraxane due to rash)  accompanied by carboplatin every 3 weeks x4 completed 07/05/2018 3.  Genetics BRCA1 mutation and MLH1 4.  07/29/2018: Bilateral mastectomies: Right mastectomy benign, 0/6 lymph nodes; left mastectomy: Benign, pathologic complete response --------------------------------------------------------------------------------------------------------------------------------- Diffuse aches and pains: CT abdomen 08/22/2019: Benign no evidence of malignancy.   Surveillance: 1.  11/03/2019: Bilateral mastectomies with reconstruction.  Slight dimpling of the left breast implant upper outer quadrant. 2.  No role of imaging because she had bilateral mastectomies.   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  02/10/2022: CT CAP at [facility]: Mild hepatic steatosis with subcentimeter liver lesions less well deviated on the current exam Liver MRI 02/23/2022: No change in the multifocal T2 hyperintense liver lesions both sides of the liver compared to 09/01/2021 favored to be multiple cysts.  Recommended a 73-monthfollow-up Liver MRI 06/09/2022: Stable, 610-monthollow-up recommended   Superficial vein thrombosis: Completed 3 months of Xarelto Ultrasound left leg 09/05/2022: Negative for blood clot.   Hypercoagulability work-up: Positive for lupus anticoagulant on 09/05/2022 (this needs to be repeated in 3 months)  Signatera testing: I discussed with her about pros and cons Signatera and we will set her up for the testing.   Bilateral knee pains: I discussed with her about using knee brace to help support during her social activities and sports.  Eventually her knees and supporting structures will get stronger where she would not need to wear them routinely.   No orders of the defined types were placed in  this encounter.  The patient has a good understanding of the overall plan. she agrees with it. she will call with any problems that may develop before the next visit here. Total time spent: 30 mins including face to face time and time spent for planning, charting and co-ordination of care   Harriette Ohara, MD 09/27/22    I Gardiner Coins am scribing for Dr. Lindi Adie  I have reviewed the above documentation for accuracy and completeness, and I agree with the above.

## 2022-10-14 ENCOUNTER — Other Ambulatory Visit: Payer: Self-pay | Admitting: Hematology and Oncology

## 2022-10-18 ENCOUNTER — Telehealth: Payer: Self-pay | Admitting: *Deleted

## 2022-10-18 NOTE — Telephone Encounter (Signed)
Received message from [facility] team stating testing can not be preformed due to inadequate sample of file.  MD notified, verbal orders received to cancel Signatera testing.  RN attempt x1 to contact pt.  No answer, LVM for pt to return call to the office.

## 2022-10-19 ENCOUNTER — Other Ambulatory Visit: Payer: Self-pay | Admitting: Hematology and Oncology

## 2022-10-19 LAB — SIGNATERA ONLY (NATERA MANAGED)

## 2022-11-21 ENCOUNTER — Telehealth: Payer: Self-pay | Admitting: Hematology and Oncology

## 2022-11-21 NOTE — Telephone Encounter (Signed)
Attempted to call patient to r/s phone appointment. Voicemail is full. R/s appointment and mailing patient reminder

## 2022-12-12 ENCOUNTER — Inpatient Hospital Stay: Payer: BC Managed Care – PPO | Attending: Hematology and Oncology

## 2022-12-12 DIAGNOSIS — M79601 Pain in right arm: Secondary | ICD-10-CM | POA: Insufficient documentation

## 2022-12-12 DIAGNOSIS — Z171 Estrogen receptor negative status [ER-]: Secondary | ICD-10-CM | POA: Insufficient documentation

## 2022-12-12 DIAGNOSIS — Z87891 Personal history of nicotine dependence: Secondary | ICD-10-CM | POA: Insufficient documentation

## 2022-12-12 DIAGNOSIS — C50211 Malignant neoplasm of upper-inner quadrant of right female breast: Secondary | ICD-10-CM | POA: Insufficient documentation

## 2022-12-12 DIAGNOSIS — Z79899 Other long term (current) drug therapy: Secondary | ICD-10-CM | POA: Insufficient documentation

## 2022-12-12 DIAGNOSIS — D6862 Lupus anticoagulant syndrome: Secondary | ICD-10-CM | POA: Insufficient documentation

## 2022-12-13 NOTE — Progress Notes (Incomplete)
HEMATOLOGY-ONCOLOGY TELEPHONE VISIT PROGRESS NOTE  I connected with our patient on 12/13/22 at 11:15 AM EST by telephone and verified that I am speaking with the correct person using two identifiers.  I discussed the limitations, risks, security and privacy concerns of performing an evaluation and management service by telephone and the availability of in person appointments.  I also discussed with the patient that there may be a patient responsible charge related to this service. The patient expressed understanding and agreed to proceed.   History of Present Illness: [name] is a 36 y.o. She had blood work for hypercoagulability and the purpose of this phone call us to discuss results.   Oncology History  Malignant neoplasm of upper-inner quadrant of right breast in female, estrogen receptor negative ([place])  02/01/2018 Initial Diagnosis   Screening detected right breast mass in the upper inner quadrant measuring 1.5 cm, with additional enhancement it measured 2.2 cm.  No axillary lymph nodes.  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  The Hereditary Gene Panel offered by [facility] includes sequencing and/or deletion duplication testing of the following 47 genes: APC, ATM, AXIN2, BARD1, BMPR1A, BRCA1, BRCA2, BRIP1, CDH1, CDK4, CDKN2A (p14ARF), CDKN2A (p16INK4a), CHEK2, CTNNA1, DICER1, EPCAM (Deletion/duplication testing only), GREM1 (promoter region deletion/duplication testing only), KIT, MEN1, MLH1, MSH2, MSH3, MSH6, MUTYH, NBN, NF1, NHTL1, PALB2, PDGFRA, PMS2, POLD1, POLE, PTEN, RAD50, RAD51C, RAD51D, SDHB, SDHC, SDHD, SMAD4, SMARCA4. STK11, TP53, TSC1, TSC2, and VHL.  The following genes were evaluated for sequence changes only: SDHA and HOXB13 c.251G>A variant only.  The report date is February 20, 2018.    02/20/2018 - 07/05/2018 Neo-Adjuvant Chemotherapy   Neoadjuvant dose dense Adriamycin and Cytoxan x4 followed by Taxol and carboplatin (received 8 cycles of Taxol and 3 cycles of carboplatin)   07/29/2018 Surgery   Bilateral mastectomies: Right mastectomy benign, 0/6 lymph nodes; left mastectomy: Benign, pathologic complete response   07/22/2019 Surgery   Total hysterectomy and bilateral salpingoophorectomy     REVIEW OF SYSTEMS:   Constitutional: Denies fevers, chills or abnormal weight loss All other systems were reviewed with the patient and are negative. Observations/Objective:     Assessment Plan:  No problem-specific Assessment & Plan notes found for this encounter.    I discussed the assessment and treatment plan with the patient. The patient was provided an opportunity to ask questions and all were answered. The patient agreed with the plan and demonstrated an understanding of the instructions. The patient was advised to call back or seek an in-person evaluation if the symptoms worsen or if the condition fails to improve as anticipated.   I provided *** minutes of non-face-to-face time during this encounter.  This includes time for charting and coordination of care   [place], CMA  I Aengus Sauceda, Favour Aleshire am acting as a Education administrator for Dr.Vinay [employer]  ***

## 2022-12-14 ENCOUNTER — Inpatient Hospital Stay: Payer: BC Managed Care – PPO | Admitting: Hematology and Oncology

## 2022-12-14 ENCOUNTER — Telehealth: Payer: Self-pay

## 2022-12-14 NOTE — Telephone Encounter (Signed)
Pt scheduled for labs 12/20/22 and MD telephone visit 12/29/21 at 0800. She is aware and in agreement.

## 2022-12-14 NOTE — Assessment & Plan Note (Deleted)
/  07/2018:Screening detected right breast mass in the upper inner quadrant measuring 1.5 cm, with additional enhancement it measured 2.2 cm.  No axillary lymph nodes.  Biopsy revealed IDC with DCIS, grade 3, ER 0%, PR 0%, HER-2 negative, Ki-67 80%, T1CN0 stage Ib clinical stage   Recommendation: 1.  Genetic counseling and testing 2. neoadjuvant chemotherapy with dose dense Adriamycin and Cytoxan every 2 weeks x4 followed by Taxol weekly x12 (changed to Abraxane due to rash)  accompanied by carboplatin every 3 weeks x4 completed 07/05/2018 3.  Genetics BRCA1 mutation and MLH1 4.  07/29/2018: Bilateral mastectomies: Right mastectomy benign, 0/6 lymph nodes; left mastectomy: Benign, pathologic complete response --------------------------------------------------------------------------------------------------------------------------------- Diffuse aches and pains: CT abdomen 08/22/2019: Benign no evidence of malignancy.   Surveillance: 1.  11/03/2019: Bilateral mastectomies with reconstruction.  Slight dimpling of the left breast implant upper outer quadrant. 2.  No role of imaging because she had bilateral mastectomies.   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  02/10/2022: CT CAP at [facility]: Mild hepatic steatosis with subcentimeter liver lesions less well deviated on the current exam Liver MRI 02/23/2022: No change in the multifocal T2 hyperintense liver lesions both sides of the liver compared to 09/01/2021 favored to be multiple cysts.  Recommended a 22-monthfollow-up Liver MRI 06/09/2022: Stable, 626-monthollow-up recommended   Superficial vein thrombosis: Completed 3 months of Xarelto Ultrasound left leg 09/05/2022: Negative for blood clot.   Hypercoagulability work-up: Positive for lupus anticoagulant on 09/05/2022 (this needs to be repeated in  3 months)

## 2022-12-19 ENCOUNTER — Other Ambulatory Visit: Payer: Self-pay

## 2022-12-19 DIAGNOSIS — Z171 Estrogen receptor negative status [ER-]: Secondary | ICD-10-CM

## 2022-12-20 ENCOUNTER — Other Ambulatory Visit: Payer: Self-pay | Admitting: *Deleted

## 2022-12-20 ENCOUNTER — Ambulatory Visit (HOSPITAL_COMMUNITY)
Admission: RE | Admit: 2022-12-20 | Discharge: 2022-12-20 | Disposition: A | Discharge status: Home / Self Care | Payer: BC Managed Care – PPO | Source: Ambulatory Visit | Attending: Physician Assistant | Admitting: Physician Assistant

## 2022-12-20 ENCOUNTER — Inpatient Hospital Stay (HOSPITAL_BASED_OUTPATIENT_CLINIC_OR_DEPARTMENT_OTHER): Payer: BC Managed Care – PPO | Admitting: Physician Assistant

## 2022-12-20 ENCOUNTER — Inpatient Hospital Stay: Payer: BC Managed Care – PPO

## 2022-12-20 ENCOUNTER — Other Ambulatory Visit: Payer: Self-pay

## 2022-12-20 VITALS — BP 138/80 | HR 83 | Temp 98.2°F | Resp 18 | Wt 148.0 lb

## 2022-12-20 DIAGNOSIS — D6862 Lupus anticoagulant syndrome: Secondary | ICD-10-CM | POA: Diagnosis not present

## 2022-12-20 DIAGNOSIS — Z171 Estrogen receptor negative status [ER-]: Secondary | ICD-10-CM | POA: Diagnosis not present

## 2022-12-20 DIAGNOSIS — M79601 Pain in right arm: Secondary | ICD-10-CM | POA: Diagnosis present

## 2022-12-20 DIAGNOSIS — I82890 Acute embolism and thrombosis of other specified veins: Secondary | ICD-10-CM

## 2022-12-20 DIAGNOSIS — C50211 Malignant neoplasm of upper-inner quadrant of right female breast: Secondary | ICD-10-CM | POA: Diagnosis present

## 2022-12-20 DIAGNOSIS — M7989 Other specified soft tissue disorders: Secondary | ICD-10-CM | POA: Diagnosis present

## 2022-12-20 DIAGNOSIS — Z79899 Other long term (current) drug therapy: Secondary | ICD-10-CM | POA: Diagnosis not present

## 2022-12-20 DIAGNOSIS — Z87891 Personal history of nicotine dependence: Secondary | ICD-10-CM | POA: Diagnosis not present

## 2022-12-20 LAB — CMP (CANCER CENTER ONLY)
ALT: 33 U/L (ref 0–44)
AST: 24 U/L (ref 15–41)
Albumin: 5.4 g/dL — ABNORMAL HIGH (ref 3.5–5.0)
Alkaline Phosphatase: 107 U/L (ref 38–126)
Anion gap: 7 (ref 5–15)
BUN: 8 mg/dL (ref 6–20)
CO2: 31 mmol/L (ref 22–32)
Calcium: 9.8 mg/dL (ref 8.9–10.3)
Chloride: 100 mmol/L (ref 98–111)
Creatinine: 0.72 mg/dL (ref 0.44–1.00)
GFR, Estimated: >60 mL/min (ref >60)
Glucose, Bld: 111 mg/dL — ABNORMAL HIGH (ref 70–99)
Potassium: 3.8 mmol/L (ref 3.5–5.1)
Sodium: 138 mmol/L (ref 135–145)
Total Bilirubin: 0.5 mg/dL (ref 0.3–1.2)
Total Protein: 8.6 g/dL — ABNORMAL HIGH (ref 6.5–8.1)

## 2022-12-20 LAB — CBC WITH DIFFERENTIAL (CANCER CENTER ONLY)
Abs Immature Granulocytes: 0.02 10*3/uL (ref 0.00–0.07)
Basophils Absolute: 0 10*3/uL (ref 0.0–0.1)
Basophils Relative: 0 %
Eosinophils Absolute: 0.1 10*3/uL (ref 0.0–0.5)
Eosinophils Relative: 2 %
HCT: 43.8 % (ref 36.0–46.0)
Hemoglobin: 14.9 g/dL (ref 12.0–15.0)
Immature Granulocytes: 0 %
Lymphocytes Relative: 27 %
Lymphs Abs: 1.7 10*3/uL (ref 0.7–4.0)
MCH: 29.4 pg (ref 26.0–34.0)
MCHC: 34 g/dL (ref 30.0–36.0)
MCV: 86.4 fL (ref 80.0–100.0)
Monocytes Absolute: 0.3 10*3/uL (ref 0.1–1.0)
Monocytes Relative: 4 %
Neutro Abs: 4.2 10*3/uL (ref 1.7–7.7)
Neutrophils Relative %: 67 %
Platelet Count: 304 10*3/uL (ref 150–400)
RBC: 5.07 MIL/uL (ref 3.87–5.11)
RDW: 12 % (ref 11.5–15.5)
WBC Count: 6.4 10*3/uL (ref 4.0–10.5)
nRBC: 0 % (ref 0.0–0.2)

## 2022-12-20 NOTE — Progress Notes (Unsigned)
Symptom Management Consult note [place]    Patient Care Team: Sasser, Silvestre Moment, MD as PCP - General (Family Medicine) Irene Limbo, MD as Consulting Physician (Plastic Surgery) Delice Bison, Charlestine Massed, NP as Nurse Practitioner (Hematology and Oncology) Nicholas Lose, MD as Consulting Physician (Hematology and Oncology) Rolm Bookbinder, MD as Consulting Physician (General Surgery)    Name of the patient: [name]  [id number]  04-14-1986   Date of visit: 12/20/2022   Chief Complaint/Reason for visit: right arm pain     ASSESSMENT & PLAN: Patient is a 36 y.o. female  with oncologic history of breast cancer with complete therapy in 2019 now undergoing workup Hypercoagulability for followed by Dr. Lindi Adie.  I have viewed most recent oncology note and lab work.    #) Right arm pain -Ultrasound ordered prior to clinic appointment was negative for DVT.  I discussed results with patient. -Pain likely related to MSK etiology.  No deformity on exam.  CBC and CMP overall unremarkable. -I discussed symptomatic care with patient and she is agreeable with this plan.   #) Hypercoagulability workup -Positive for lupus anticoagulant on 09/05/2022. -Patient had lab work drawn today for upcoming MD appointment on 12/29/2021.  He will go over results of her lupus anticoagulant, cardiolipin antibodies and beta-2 glycoprotein testing.      Heme/Onc History: Oncology History  Malignant neoplasm of upper-inner quadrant of right breast in female, estrogen receptor negative ([place])  02/01/2018 Initial Diagnosis   Screening detected right breast mass in the upper inner quadrant measuring 1.5 cm, with additional enhancement it measured 2.2 cm.  No axillary lymph nodes.  Biopsy revealed IDC with DCIS, grade 3, ER 0%, PR 0%, HER-2 negative, Ki-67 80%, T1CN0 stage Ib clinical stage   02/20/2018 Genetic Testing   BRCA1 c.2722G>T pathogenic mutation and MLH1 c.979C>G and RAD51C  c.506T>C VUS identified on the common hereditary cancer panel.  The Hereditary Gene Panel offered by [facility] includes sequencing and/or deletion duplication testing of the following 47 genes: APC, ATM, AXIN2, BARD1, BMPR1A, BRCA1, BRCA2, BRIP1, CDH1, CDK4, CDKN2A (p14ARF), CDKN2A (p16INK4a), CHEK2, CTNNA1, DICER1, EPCAM (Deletion/duplication testing only), GREM1 (promoter region deletion/duplication testing only), KIT, MEN1, MLH1, MSH2, MSH3, MSH6, MUTYH, NBN, NF1, NHTL1, PALB2, PDGFRA, PMS2, POLD1, POLE, PTEN, RAD50, RAD51C, RAD51D, SDHB, SDHC, SDHD, SMAD4, SMARCA4. STK11, TP53, TSC1, TSC2, and VHL.  The following genes were evaluated for sequence changes only: SDHA and HOXB13 c.251G>A variant only. The report date is February 20, 2018.    02/20/2018 - 07/05/2018 Neo-Adjuvant Chemotherapy   Neoadjuvant dose dense Adriamycin and Cytoxan x4 followed by Taxol and carboplatin (received 8 cycles of Taxol and 3 cycles of carboplatin)   07/29/2018 Surgery   Bilateral mastectomies: Right mastectomy benign, 0/6 lymph nodes; left mastectomy: Benign, pathologic complete response   07/22/2019 Surgery   Total hysterectomy and bilateral salpingoophorectomy       Interval history-: [name] is a 36 y.o. female with oncologic history as above presenting to [facility] today with chief complaint of right arm pain x 2 days.  She presents unaccompanied to clinic today.  Patient reports pain in her right forearm started after playing volleyball.  She had some family in town for the holidays and played volleyball with them.  She had not played in probably 3 or so weeks she estimates.  She admits to having soreness located in her right forearm.  She noticed a lump this morning and wanted to rule out a blood clot as  she has a history of superficial vein thrombosis.  Soreness does not radiate.  Soreness is worse with movement.  She rates pain currently 2 out of 10 in severity.  No over-the-counter medications tried prior to  arrival.  Patient denies any fall or injury or trauma.  She denies any numbness or tingling.      ROS  All other systems are reviewed and are negative for acute change except as noted in the HPI.    Allergies  Allergen Reactions   Lidocaine Other (See Comments)    NUMBNESS AND TINGLING ARMS AND LEGS     Past Medical History:  Diagnosis Date   breast ca dx'd 01/2018   right   Cluster headaches    Complication of anesthesia    severe hypotension to the point of syncope   Family history of breast cancer    Family history of colon cancer    Family history of stomach cancer    History of breast cancer 01/2018   right   History of chemotherapy    finished 07/05/2018   History of palpitations    due to low K+   Hyperthyroidism    no current med.   Infection of left breast 11/11/2018   to start antibiotic 11/11/2018   Irritable bowel syndrome (IBS)    no current med.     Past Surgical History:  Procedure Laterality Date   BREAST RECONSTRUCTION WITH PLACEMENT OF TISSUE EXPANDER AND ALLODERM Bilateral 07/29/2018   Procedure: BILATERAL BREAST RECONSTRUCTION WITH PLACEMENT OF TISSUE EXPANDER AND ALLODERM;  Surgeon: Irene Limbo, MD;  Location: [place];  Service: Plastics;  Laterality: Bilateral;   CESAREAN SECTION     CESAREAN SECTION WITH BILATERAL TUBAL LIGATION Bilateral 11/19/2013   Procedure: REPEAT CESAREAN SECTION WITH BILATERAL TUBAL LIGATION; TWINS;  Surgeon: Lovenia Kim, MD;  Location: [facility];  Service: Obstetrics;  Laterality: Bilateral;  EDD: 12/09/13   CHOLECYSTECTOMY     PORTA CATH REMOVAL Right 11/19/2018   Procedure: PORTA CATH REMOVAL;  Surgeon: Irene Limbo, MD;  Location: [place];  Service: Plastics;  Laterality: Right;   PORTACATH PLACEMENT N/A 02/20/2018   Procedure: INSERTION PORT-A-CATH WITH ULTRASOUND;  Surgeon: Rolm Bookbinder, MD;  Location: [place];  Service: General;  Laterality:  N/A;   REMOVAL OF BILATERAL TISSUE EXPANDERS WITH PLACEMENT OF BILATERAL BREAST IMPLANTS Bilateral 11/19/2018   Procedure: REMOVAL OF BILATERAL TISSUE EXPANDERS WITH PLACEMENT OF BILATERAL BREAST IMPLANTS;  Surgeon: Irene Limbo, MD;  Location: [place];  Service: Plastics;  Laterality: Bilateral;   ROBOTIC ASSISTED TOTAL HYSTERECTOMY WITH BILATERAL SALPINGO OOPHERECTOMY Bilateral 07/22/2019   Procedure: XI ROBOTIC ASSISTED TOTAL HYSTERECTOMY WITH BILATERAL SALPINGO OOPHORECTOMY WITH UMBILICAL HERNIA REPAIR;  Surgeon: Everitt Amber, MD;  Location: [facility];  Service: Gynecology;  Laterality: Bilateral;    Social History   Socioeconomic History   Marital status: Married    Spouse name: Not on file   Number of children: Not on file   Years of education: Not on file   Highest education level: Not on file  Occupational History   Not on file  Tobacco Use   Smoking status: Former    Types: Cigarettes    Quit date: 12/25/2003    Years since quitting: 19.0   Smokeless tobacco: Never  Vaping Use   Vaping Use: Never used  Substance and Sexual Activity   Alcohol use: No   Drug use: No   Sexual activity: Not Currently  Other Topics Concern   Not on file  Social History Narrative   Not on file   Social Determinants of Health   Financial Resource Strain: Not on file  Food Insecurity: Not on file  Transportation Needs: Not on file  Physical Activity: Not on file  Stress: Not on file  Social Connections: Not on file  Intimate Partner Violence: Not on file    Family History  Problem Relation Age of Onset   Cirrhosis Father    Breast cancer Paternal Aunt 21       BRCA1 p.E908 pos   Heart disease Maternal Grandfather        Heart attack   Breast cancer Paternal Aunt        dx in her 75s   Colon cancer Maternal Aunt        dx in her 9s   Lupus Sister        pat 1/2 sister   Other Brother 93       pat 1/2 brother died in MVA   Lung cancer Maternal Uncle         smoker   Other Paternal Aunt        died in a MVA   Other Paternal Aunt        died from poisioning     Current Outpatient Medications:    venlafaxine (EFFEXOR) 75 MG tablet, Take 1 tablet by mouth once daily, Disp: 90 tablet, Rfl: 3  PHYSICAL EXAM: ECOG FS:1 - Symptomatic but completely ambulatory    Vitals:   12/20/22 1433  Weight: 148 lb (67.1 kg)   Physical Exam Vitals and nursing note reviewed.  Constitutional:      Appearance: She is well-developed. She is not ill-appearing or toxic-appearing.  HENT:     Head: Normocephalic.     Nose: Nose normal.  Eyes:     Conjunctiva/sclera: Conjunctivae normal.  Neck:     Vascular: No JVD.  Cardiovascular:     Rate and Rhythm: Normal rate and regular rhythm.     Pulses: Normal pulses.     Heart sounds: Normal heart sounds.  Pulmonary:     Effort: Pulmonary effort is normal.     Breath sounds: Normal breath sounds.  Abdominal:     General: There is no distension.  Musculoskeletal:     Cervical back: Normal range of motion.     Comments: Compartments soft in RUE. No palpable cords, no erythema or skin changes. Tenderness to palpation of flexor pollicic longus. Full ROM of right upper extremity. Strong and equal grip strength in bilateral UE  Skin:    General: Skin is warm and dry.     Comments: Equal tactile temperature in bilateral upper extremities.  Neurological:     Mental Status: She is oriented to person, place, and time.        LABORATORY DATA: I have reviewed the data as listed    Latest Ref Rng & Units 12/20/2022    2:12 PM 05/31/2022    3:38 PM 09/01/2021   10:39 AM  CBC  WBC 4.0 - 10.5 K/uL 6.4  6.4  5.2   Hemoglobin 12.0 - 15.0 g/dL 14.9  14.0  13.0   Hematocrit 36.0 - 46.0 % 43.8  40.9  39.4   Platelets 150 - 400 K/uL 304  298  254         Latest Ref Rng & Units 12/20/2022    2:12 PM 05/31/2022    3:38 PM  09/01/2021   10:39 AM  CMP  Glucose 70 - 99 mg/dL 111  92  76   BUN 6 - 20 mg/dL _0 Creatinine 0.44 - 1.00 mg/dL 0.72  0.64  0.72   Sodium 135 - 145 mmol/L 138  141  142   Potassium 3.5 - 5.1 mmol/L 3.8  3.9  4.2   Chloride 98 - 111 mmol/L 100  103  105   CO2 22 - 32 mmol/L _1 Calcium 8.9 - 10.3 mg/dL 9.8  10.3  10.1   Total Protein 6.5 - 8.1 g/dL 8.6  8.3  7.3   Total Bilirubin 0.3 - 1.2 mg/dL 0.5  0.5  0.4   Alkaline Phos 38 - 126 U/L 107  97  92   AST 15 - 41 U/L _2 ALT 0 - 44 U/L 33  32  22        RADIOGRAPHIC STUDIES (from last 24 hours if applicable) I have personally reviewed the radiological images as listed and agreed with the findings in the report. VAS [place] UPPER EXTREMITY VENOUS DUPLEX  Result Date: 12/20/2022 UPPER VENOUS STUDY  Patient Name:  JESSIKAH DICKER  Date of Exam:   12/20/2022 Medical Rec #: [id number]          Accession #:    [id number] Date of Birth: [birth year]           Patient Gender: F Patient Age:   12 years Exam Location:  [facility] Procedure:      VAS [place] UPPER EXTREMITY VENOUS DUPLEX Referring Phys: Sherol Dade --------------------------------------------------------------------------------  Indications: pain and swelling of right forearm Comparison Study: No previous exams Performing Technologist: Jody Hill RVT, RDMS  Examination Guidelines: A complete evaluation includes B-mode imaging, spectral Doppler, color Doppler, and power Doppler as needed of all accessible portions of each vessel. Bilateral testing is considered an integral part of a complete examination. Limited examinations for reoccurring indications may be performed as noted.  Right Findings: +----------+------------+---------+-----------+----------+-------+ RIGHT     CompressiblePhasicitySpontaneousPropertiesSummary +----------+------------+---------+-----------+----------+-------+ IJV           Full       Yes       Yes                      +----------+------------+---------+-----------+----------+-------+ Subclavian    Full        Yes       Yes                      +----------+------------+---------+-----------+----------+-------+ Axillary      Full       Yes       Yes                      +----------+------------+---------+-----------+----------+-------+ Brachial      Full       Yes       Yes                      +----------+------------+---------+-----------+----------+-------+ Radial        Full                                          +----------+------------+---------+-----------+----------+-------+ Ulnar  Full                                          +----------+------------+---------+-----------+----------+-------+ Cephalic      Full                                          +----------+------------+---------+-----------+----------+-------+ Basilic       Full       Yes       Yes                      +----------+------------+---------+-----------+----------+-------+  Left Findings: +----------+------------+---------+-----------+----------+--------------------+ LEFT      CompressiblePhasicitySpontaneousProperties      Summary        +----------+------------+---------+-----------+----------+--------------------+ Subclavian               Yes       Yes                   patent by                                                              color/doppler     +----------+------------+---------+-----------+----------+--------------------+  Summary:  Right: No evidence of deep vein thrombosis in the upper extremity. No evidence of superficial vein thrombosis in the upper extremity.  Left: No evidence of thrombosis in the subclavian.  *See table(s) above for measurements and observations.  Diagnosing physician: Deitra Mayo MD Electronically signed by Deitra Mayo MD on 12/20/2022 at 2:33:40 PM.    Final         Visit Diagnosis: 1. Pain of right upper extremity      No orders of the defined types were placed in this encounter.   All questions  were answered. The patient knows to call the clinic with any problems, questions or concerns. No barriers to learning was detected.  I have spent a total of 10 minutes minutes of face-to-face and non-face-to-face time, preparing to see the patient, obtaining and/or reviewing separately obtained history, performing a medically appropriate examination, counseling and educating the patient, viewing and interpreting labs and imaging , documenting clinical information in the electronic health record.    Thank you for allowing me to participate in the care of this patient.    Barrie Folk, PA-C Department of Hematology/Oncology [facility] at [facility] Phone: [phone]  [phone]    12/20/2022 5:35 PM

## 2022-12-20 NOTE — Progress Notes (Signed)
Received call from pt with complaint of redness, swelling and tenderness of right arm x several days.  Pt with hx lower extremity DVT and concerned for DVT in arm.  RN reviewed with provider and verbal orders received to obtain STAT Vas [place] to r/o DVT and [facility] f/u.  Orders placed, appt scheduled, pt educated and verbalized understanding.

## 2022-12-21 ENCOUNTER — Telehealth: Payer: BC Managed Care – PPO | Admitting: Hematology and Oncology

## 2022-12-21 LAB — BETA-2-GLYCOPROTEIN I ABS, IGG/M/A
Beta-2 Glyco I IgG: <9 GPI IgG units (ref 0–20)
Beta-2-Glycoprotein I IgA: <9 GPI IgA units (ref 0–25)
Beta-2-Glycoprotein I IgM: <9 GPI IgM units (ref 0–32)

## 2022-12-21 LAB — CARDIOLIPIN ANTIBODIES, IGG, IGM, IGA
Anticardiolipin IgA: <9 APL U/mL (ref 0–11)
Anticardiolipin IgG: <9 GPL U/mL (ref 0–14)
Anticardiolipin IgM: <9 MPL U/mL (ref 0–12)

## 2022-12-21 LAB — LUPUS ANTICOAGULANT PANEL
DRVVT: 40.1 s (ref 0.0–47.0)
PTT Lupus Anticoagulant: 34 s (ref 0.0–43.5)

## 2022-12-25 NOTE — Progress Notes (Signed)
HEMATOLOGY-ONCOLOGY TELEPHONE VISIT PROGRESS NOTE  I connected with our patient on 12/29/22 at  8:00 AM EST by telephone and verified that I am speaking with the correct person using two identifiers.  I discussed the limitations, risks, security and privacy concerns of performing an evaluation and management service by telephone and the availability of in person appointments.  I also discussed with the patient that there may be a patient responsible charge related to this service. The patient expressed understanding and agreed to proceed.   History of Present Illness:  [name] is a 37 y.o. with the above-mentioned. She presents to the clinic for labs and follow-up.    Oncology History  Malignant neoplasm of upper-inner quadrant of right breast in female, estrogen receptor negative ([facility])  02/01/2018 Initial Diagnosis   Screening detected right breast mass in the upper inner quadrant measuring 1.5 cm, with additional enhancement it measured 2.2 cm.  No axillary lymph nodes.  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  The Hereditary Gene Panel offered by [facility] includes sequencing and/or deletion duplication testing of the following 47 genes: APC, ATM, AXIN2, BARD1, BMPR1A, BRCA1, BRCA2, BRIP1, CDH1, CDK4, CDKN2A (p14ARF), CDKN2A (p16INK4a), CHEK2, CTNNA1, DICER1, EPCAM (Deletion/duplication testing only), GREM1 (promoter region deletion/duplication testing only), KIT, MEN1, MLH1, MSH2, MSH3, MSH6, MUTYH, NBN, NF1, NHTL1, PALB2, PDGFRA, PMS2, POLD1, POLE, PTEN, RAD50, RAD51C, RAD51D, SDHB, SDHC, SDHD, SMAD4, SMARCA4. STK11, TP53, TSC1, TSC2, and VHL.  The following genes were evaluated for sequence changes only: SDHA and HOXB13 c.251G>A variant only. The report date is  February 20, 2018.    02/20/2018 - 07/05/2018 Neo-Adjuvant Chemotherapy   Neoadjuvant dose dense Adriamycin and Cytoxan x4 followed by Taxol and carboplatin (received 8 cycles of Taxol and 3 cycles of carboplatin)   07/29/2018 Surgery   Bilateral mastectomies: Right mastectomy benign, 0/6 lymph nodes; left mastectomy: Benign, pathologic complete response   07/22/2019 Surgery   Total hysterectomy and bilateral salpingoophorectomy     REVIEW OF SYSTEMS:   Constitutional: Denies fevers, chills or abnormal weight loss All other systems were reviewed with the patient and are negative. Observations/Objective:     Assessment Plan:  Malignant neoplasm of upper-inner quadrant of right breast in female, estrogen receptor negative ([facility]) 02/01/2018:Screening detected right breast mass in the upper inner quadrant measuring 1.5 cm, with additional enhancement it measured 2.2 cm.  No axillary lymph nodes.  Biopsy revealed IDC with DCIS, grade 3, ER 0%, PR 0%, HER-2 negative, Ki-67 80%, T1CN0 stage Ib clinical stage   Recommendation: 1.  Genetic counseling and testing 2. neoadjuvant chemotherapy with dose dense Adriamycin and Cytoxan every 2 weeks x4 followed by Taxol weekly x12 (changed to Abraxane due to rash)  accompanied by carboplatin every 3 weeks x4 completed 07/05/2018 3.  Genetics BRCA1 mutation and MLH1 4.  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   Patient decided not to take adjuvant olaparib.  02/10/2022: CT CAP at [facility]: Mild hepatic steatosis with subcentimeter liver lesions less well deviated on the current exam Liver MRI 02/23/2022: No change in the multifocal T2 hyperintense liver lesions both sides of the liver compared to 09/01/2021 favored  to be multiple cysts.    Recommended a 3-month follow-up Liver MRI 06/09/2022: Stable, 6-month follow-up recommended  Superficial vein thrombosis Completed 3 months of Xarelto Ultrasound left leg 09/05/2022: Negative for blood clot.   Hypercoagulability work-up: Positive for lupus anticoagulant on 09/05/2022 (this needs to be repeated in 3 months) Repeat testing 12/21/2022: Negative for lupus anticoagulant Therefore we can conclude that the initial positive test was a false positive result.  Right arm pain: Most likely musculoskeletal in etiology since ultrasound was negative for DVT.  We will obtain a liver MRI in the next couple of weeks and I will call her with the results with a telephone visit after that.   I discussed the assessment and treatment plan with the patient. The patient was provided an opportunity to ask questions and all were answered. The patient agreed with the plan and demonstrated an understanding of the instructions. The patient was advised to call back or seek an in-person evaluation if the symptoms worsen or if the condition fails to improve as anticipated.   I provided 12 minutes of non-face-to-face time during this encounter.  This includes time for charting and coordination of care   Viinay K Gudena, MD   

## 2022-12-29 ENCOUNTER — Inpatient Hospital Stay: Payer: 59 | Attending: Hematology and Oncology | Admitting: Hematology and Oncology

## 2022-12-29 DIAGNOSIS — K769 Liver disease, unspecified: Secondary | ICD-10-CM | POA: Diagnosis not present

## 2022-12-29 DIAGNOSIS — I82890 Acute embolism and thrombosis of other specified veins: Secondary | ICD-10-CM | POA: Diagnosis not present

## 2022-12-29 DIAGNOSIS — C50211 Malignant neoplasm of upper-inner quadrant of right female breast: Secondary | ICD-10-CM | POA: Diagnosis not present

## 2022-12-29 DIAGNOSIS — Z171 Estrogen receptor negative status [ER-]: Secondary | ICD-10-CM

## 2022-12-29 NOTE — Assessment & Plan Note (Signed)
Completed 3 months of Xarelto Ultrasound left leg 09/05/2022: Negative for blood clot.   Hypercoagulability work-up: Positive for lupus anticoagulant on 09/05/2022 (this needs to be repeated in 3 months) Repeat testing 12/21/2022: Negative for lupus anticoagulant Therefore we can conclude that the initial positive test was a false positive result.  Right arm pain: Most likely musculoskeletal in etiology since ultrasound was negative for DVT.

## 2022-12-29 NOTE — Assessment & Plan Note (Signed)
02/01/2018:Screening detected right breast mass in the upper inner quadrant measuring 1.5 cm, with additional enhancement it measured 2.2 cm.  No axillary lymph nodes.  Biopsy revealed IDC with DCIS, grade 3, ER 0%, PR 0%, HER-2 negative, Ki-67 80%, T1CN0 stage Ib clinical stage   Recommendation: 1.  Genetic counseling and testing 2. neoadjuvant chemotherapy with dose dense Adriamycin and Cytoxan every 2 weeks x4 followed by Taxol weekly x12 (changed to Abraxane due to rash)  accompanied by carboplatin every 3 weeks x4 completed 07/05/2018 3.  Genetics BRCA1 mutation and MLH1 4.  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   Patient decided not to take adjuvant olaparib.  02/10/2022: CT CAP at [facility]: Mild hepatic steatosis with subcentimeter liver lesions less well deviated on the current exam Liver MRI 02/23/2022: No change in the multifocal T2 hyperintense liver lesions both sides of the liver compared to 09/01/2021 favored to be multiple cysts.  Recommended a 93-monthfollow-up Liver MRI 06/09/2022: Stable, 615-monthollow-up recommended

## 2023-01-17 ENCOUNTER — Ambulatory Visit (HOSPITAL_COMMUNITY)
Admission: RE | Admit: 2023-01-17 | Discharge: 2023-01-17 | Disposition: A | Discharge status: Home / Self Care | Payer: 59 | Source: Ambulatory Visit | Attending: Hematology and Oncology | Admitting: Hematology and Oncology

## 2023-01-17 DIAGNOSIS — K769 Liver disease, unspecified: Secondary | ICD-10-CM | POA: Diagnosis not present

## 2023-01-17 DIAGNOSIS — Z853 Personal history of malignant neoplasm of breast: Secondary | ICD-10-CM | POA: Diagnosis not present

## 2023-01-17 DIAGNOSIS — D1800 Hemangioma unspecified site: Secondary | ICD-10-CM | POA: Diagnosis not present

## 2023-01-17 DIAGNOSIS — Z9882 Breast implant status: Secondary | ICD-10-CM | POA: Diagnosis not present

## 2023-01-17 DIAGNOSIS — K760 Fatty (change of) liver, not elsewhere classified: Secondary | ICD-10-CM | POA: Diagnosis not present

## 2023-01-17 MED ORDER — GADOBUTROL 1 MMOL/ML IV SOLN
6.5000 mL | Freq: Once | INTRAVENOUS | Status: AC | PRN
  Administered 2023-01-17: 6.5 mL via INTRAVENOUS

## 2023-01-18 ENCOUNTER — Telehealth: Payer: Self-pay | Admitting: Hematology and Oncology

## 2023-01-18 ENCOUNTER — Encounter: Payer: Self-pay | Admitting: Hematology and Oncology

## 2023-01-18 NOTE — Telephone Encounter (Signed)
I called the patient to discuss her liver MRI results which was benign.  However there was no way to leave a voicemail.

## 2023-06-04 ENCOUNTER — Ambulatory Visit (HOSPITAL_COMMUNITY)
Admission: RE | Admit: 2023-06-04 | Discharge: 2023-06-04 | Disposition: A | Discharge status: Home / Self Care | Payer: 59 | Source: Ambulatory Visit | Attending: Adult Health | Admitting: Adult Health

## 2023-06-04 ENCOUNTER — Other Ambulatory Visit: Payer: Self-pay | Admitting: *Deleted

## 2023-06-04 ENCOUNTER — Other Ambulatory Visit: Payer: Self-pay | Admitting: Adult Health

## 2023-06-04 DIAGNOSIS — C50211 Malignant neoplasm of upper-inner quadrant of right female breast: Secondary | ICD-10-CM | POA: Diagnosis not present

## 2023-06-04 DIAGNOSIS — M25551 Pain in right hip: Secondary | ICD-10-CM | POA: Diagnosis not present

## 2023-06-04 DIAGNOSIS — Z171 Estrogen receptor negative status [ER-]: Secondary | ICD-10-CM | POA: Diagnosis not present

## 2023-06-04 DIAGNOSIS — M545 Low back pain, unspecified: Secondary | ICD-10-CM | POA: Insufficient documentation

## 2023-06-04 DIAGNOSIS — M25552 Pain in left hip: Secondary | ICD-10-CM | POA: Diagnosis not present

## 2023-06-04 NOTE — Progress Notes (Signed)
Received call from pt with complaint of ongoing lower back pain and bilateral hips x 4 days causing increased pain and difficulty walking.  Per NP pt needing xray's and [facility] visit.  Orders placed, appts scheduled and pt verbalized understanding of plan of care with appt date and times.

## 2023-06-05 ENCOUNTER — Other Ambulatory Visit: Payer: Self-pay

## 2023-06-05 ENCOUNTER — Inpatient Hospital Stay: Payer: 59 | Attending: Adult Health | Admitting: Adult Health

## 2023-06-05 ENCOUNTER — Encounter: Payer: Self-pay | Admitting: Adult Health

## 2023-06-05 VITALS — BP 115/75 | HR 76 | Temp 98.5°F | Resp 18 | Wt 155.7 lb

## 2023-06-05 DIAGNOSIS — Z9013 Acquired absence of bilateral breasts and nipples: Secondary | ICD-10-CM | POA: Insufficient documentation

## 2023-06-05 DIAGNOSIS — C50211 Malignant neoplasm of upper-inner quadrant of right female breast: Secondary | ICD-10-CM | POA: Insufficient documentation

## 2023-06-05 DIAGNOSIS — Z803 Family history of malignant neoplasm of breast: Secondary | ICD-10-CM | POA: Diagnosis not present

## 2023-06-05 DIAGNOSIS — Z171 Estrogen receptor negative status [ER-]: Secondary | ICD-10-CM | POA: Insufficient documentation

## 2023-06-05 DIAGNOSIS — Z87891 Personal history of nicotine dependence: Secondary | ICD-10-CM | POA: Diagnosis not present

## 2023-06-05 DIAGNOSIS — M545 Low back pain, unspecified: Secondary | ICD-10-CM | POA: Insufficient documentation

## 2023-06-05 DIAGNOSIS — Z801 Family history of malignant neoplasm of trachea, bronchus and lung: Secondary | ICD-10-CM | POA: Insufficient documentation

## 2023-06-05 DIAGNOSIS — M5441 Lumbago with sciatica, right side: Secondary | ICD-10-CM

## 2023-06-05 DIAGNOSIS — Z800 Family history of malignant neoplasm of digestive organs: Secondary | ICD-10-CM | POA: Diagnosis not present

## 2023-06-05 MED ORDER — METHYLPREDNISOLONE 4 MG PO TBPK: ORAL_TABLET | ORAL | 0 refills | Status: DC

## 2023-06-05 NOTE — Assessment & Plan Note (Addendum)
[name] is a 37 year old woman with history of stage IIb triple negative breast cancer diagnosed in 2020 here today for follow-up of increasing lower back pain.  She has no radiographic signs on x-rays that she has recurrence of her breast cancer.  I am concerned about this lower back pain however and the impact it is having on her mobility and the strength in her legs.  Since her x-rays were negative I did order an MRI of the lumbar spine to further evaluate.  I also prescribed a Medrol Dosepak for her to take.  We will follow-up with her after she undergoes the MRI to discuss next steps.

## 2023-06-05 NOTE — Progress Notes (Signed)
[facility] Cancer Follow up:    Jamie Pandy, MD [address] [place] [address]   DIAGNOSIS:  Cancer Staging  Malignant neoplasm of upper-inner quadrant of right breast in female, estrogen receptor negative (HCC) Staging form: Breast, AJCC 8th Edition - Clinical: Stage IIB (cT2, cN0, cM0, G3, ER-, PR-, HER2-) - Unsigned Histologic grading system: 3 grade system - Pathologic: pT0, pN0, cM0 - Unsigned   SUMMARY OF ONCOLOGIC HISTORY: Oncology History  Malignant neoplasm of upper-inner quadrant of right breast in female, estrogen receptor negative (HCC)  02/01/2018 Initial Diagnosis   Screening detected right breast mass in the upper inner quadrant measuring 1.5 cm, with additional enhancement it measured 2.2 cm.  No axillary lymph nodes.  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  The Hereditary Gene Panel offered by [facility] includes sequencing and/or deletion duplication testing of the following 47 genes: APC, ATM, AXIN2, BARD1, BMPR1A, BRCA1, BRCA2, BRIP1, CDH1, CDK4, CDKN2A (p14ARF), CDKN2A (p16INK4a), CHEK2, CTNNA1, DICER1, EPCAM (Deletion/duplication testing only), GREM1 (promoter region deletion/duplication testing only), KIT, MEN1, MLH1, MSH2, MSH3, MSH6, MUTYH, NBN, NF1, NHTL1, PALB2, PDGFRA, PMS2, POLD1, POLE, PTEN, RAD50, RAD51C, RAD51D, SDHB, SDHC, SDHD, SMAD4, SMARCA4. STK11, TP53, TSC1, TSC2, and VHL.  The following genes were evaluated for sequence changes only: SDHA and HOXB13 c.251G>A variant only. The report date is February 20, 2018.    02/20/2018 - 07/05/2018 Neo-Adjuvant Chemotherapy   Neoadjuvant dose dense Adriamycin and Cytoxan x4 followed by Taxol and carboplatin (received 8 cycles of Taxol and 3 cycles of carboplatin)    07/29/2018 Surgery   Bilateral mastectomies: Right mastectomy benign, 0/6 lymph nodes; left mastectomy: Benign, pathologic complete response   07/22/2019 Surgery   Total hysterectomy and bilateral salpingoophorectomy     CURRENT THERAPY: Observation  INTERVAL HISTORY: [name] 36 y.o. female returns for follow-up and evaluation of lower back pain that has been progressively worsening over the past 2 weeks.  She has had chronic back pain however this pain started and began to radiate to her right hip and down her leg more than she has previously experienced.  She also feels heaviness in her legs when she is walking around and notes that the pain is worse when she is lying flat and also when she is on her feet for longer periods of time.  She has had difficulty sleeping related to the pain.  Before her visit today she underwent x-rays of the lumbar spine and pelvis with bilateral hips were completed yesterday and these were negative.   Patient Active Problem List   Diagnosis Date Noted   Superficial vein thrombosis 09/11/2022   Vasomotor symptoms due to menopause 08/22/2019   BRCA1 gene mutation positive 02/20/2018   Genetic testing 02/20/2018   Family history of breast cancer    Family history of colon cancer    Family history of stomach cancer    Malignant neoplasm of upper-inner quadrant of right breast in female, estrogen receptor negative (HCC)    Acid reflux 01/18/2018   Diabetes mellitus arising in pregnancy 01/18/2018   H/O tubal ligation 01/18/2018   History of palpitations 01/18/2018   Umbilical swelling 01/18/2018   Hypokalemia 10/16/2017   Cervicalgia 11/22/2016   Low back pain 11/22/2016  Pain in thoracic spine 11/22/2016   Hand, foot and mouth disease 11/08/2015   Incisional hernia, without obstruction or gangrene 09/03/2015   Acute serous otitis media 07/11/2015   Abdominal pain of other specified site 04/01/2015   Umbilical hernia without obstruction and  without gangrene 01/05/2014   Postpartum care following cesarean delivery (11/26) 11/19/2013   Acute sinusitis 09/15/2013   Irregular heart rate 08/20/2013   Heart palpitations 08/20/2013   Mastodynia 12/09/2012   Abdominal pain, epigastric 03/29/2012   Nausea without vomiting 03/29/2012   Intractable migraine 01/10/2012   Lumbosacral ligament sprain 01/10/2012   Thoracic back sprain 01/10/2012   Influenza with respiratory manifestation 12/20/2011   Dysuria 10/11/2011   Lumbar sprain 10/11/2011   Benign paroxysmal positional vertigo 06/01/2011   Other malaise and fatigue 06/01/2011    is allergic to lidocaine.  MEDICAL HISTORY: Past Medical History:  Diagnosis Date   breast ca dx'd 01/2018   right   Cluster headaches    Complication of anesthesia    severe hypotension to the point of syncope   Family history of breast cancer    Family history of colon cancer    Family history of stomach cancer    History of breast cancer 01/2018   right   History of chemotherapy    finished 07/05/2018   History of palpitations    due to low K+   Hyperthyroidism    no current med.   Infection of left breast 11/11/2018   to start antibiotic 11/11/2018   Irritable bowel syndrome (IBS)    no current med.    SURGICAL HISTORY: Past Surgical History:  Procedure Laterality Date   BREAST RECONSTRUCTION WITH PLACEMENT OF TISSUE EXPANDER AND ALLODERM Bilateral 07/29/2018   Procedure: BILATERAL BREAST RECONSTRUCTION WITH PLACEMENT OF TISSUE EXPANDER AND ALLODERM;  Surgeon: Glenna Fellows, MD;  Location: [facility];  Service: Plastics;  Laterality: Bilateral;   CESAREAN SECTION     CESAREAN SECTION WITH BILATERAL TUBAL LIGATION Bilateral 11/19/2013   Procedure: REPEAT CESAREAN SECTION WITH BILATERAL TUBAL LIGATION; TWINS;  Surgeon: Lenoard Aden, MD;  Location: [facility];  Service: Obstetrics;  Laterality: Bilateral;  EDD: 12/09/13   CHOLECYSTECTOMY     PORTA CATH REMOVAL Right  11/19/2018   Procedure: PORTA CATH REMOVAL;  Surgeon: Glenna Fellows, MD;  Location: [facility];  Service: Plastics;  Laterality: Right;   PORTACATH PLACEMENT N/A 02/20/2018   Procedure: INSERTION PORT-A-CATH WITH ULTRASOUND;  Surgeon: Emelia Loron, MD;  Location: [facility];  Service: General;  Laterality: N/A;   REMOVAL OF BILATERAL TISSUE EXPANDERS WITH PLACEMENT OF BILATERAL BREAST IMPLANTS Bilateral 11/19/2018   Procedure: REMOVAL OF BILATERAL TISSUE EXPANDERS WITH PLACEMENT OF BILATERAL BREAST IMPLANTS;  Surgeon: Glenna Fellows, MD;  Location: [facility];  Service: Plastics;  Laterality: Bilateral;   ROBOTIC ASSISTED TOTAL HYSTERECTOMY WITH BILATERAL SALPINGO OOPHERECTOMY Bilateral 07/22/2019   Procedure: XI ROBOTIC ASSISTED TOTAL HYSTERECTOMY WITH BILATERAL SALPINGO OOPHORECTOMY WITH UMBILICAL HERNIA REPAIR;  Surgeon: Adolphus Birchwood, MD;  Location: [facility];  Service: Gynecology;  Laterality: Bilateral;    SOCIAL HISTORY: Social History   Socioeconomic History   Marital status: Married    Spouse name: Not on file   Number of children: Not on file   Years of education: Not on file   Highest education level: Not on file  Occupational History   Not on file  Tobacco Use   Smoking status: Former    Types: Cigarettes  Quit date: 12/25/2003    Years since quitting: 19.4   Smokeless tobacco: Never  Vaping Use   Vaping Use: Never used  Substance and Sexual Activity   Alcohol use: No   Drug use: No   Sexual activity: Not Currently  Other Topics Concern   Not on file  Social History Narrative   Not on file   Social Determinants of Health   Financial Resource Strain: Not on file  Food Insecurity: Not on file  Transportation Needs: Not on file  Physical Activity: Not on file  Stress: Not on file  Social Connections: Not on file  Intimate Partner Violence: Not on file    FAMILY HISTORY: Family History  Problem Relation  Age of Onset   Cirrhosis Father    Breast cancer Paternal Aunt 72       BRCA1 p.E908 pos   Heart disease Maternal Grandfather        Heart attack   Breast cancer Paternal Aunt        dx in her 30s   Colon cancer Maternal Aunt        dx in her 30s   Lupus Sister        pat 1/2 sister   Other Brother 16       pat 1/2 brother died in MVA   Lung cancer Maternal Uncle        smoker   Other Paternal Aunt        died in a MVA   Other Paternal Aunt        died from poisioning    Review of Systems  Constitutional:  Negative for appetite change, chills, fatigue, fever and unexpected weight change.  HENT:   Negative for hearing loss, lump/mass and trouble swallowing.   Eyes:  Negative for eye problems and icterus.  Respiratory:  Negative for chest tightness, cough and shortness of breath.   Cardiovascular:  Negative for chest pain, leg swelling and palpitations.  Gastrointestinal:  Negative for abdominal distention, abdominal pain, constipation, diarrhea, nausea and vomiting.  Endocrine: Negative for hot flashes.  Genitourinary:  Negative for difficulty urinating.   Musculoskeletal:  Positive for back pain. Negative for arthralgias.  Skin:  Negative for itching and rash.  Neurological:  Negative for dizziness, extremity weakness, headaches and numbness.  Hematological:  Negative for adenopathy. Does not bruise/bleed easily.  Psychiatric/Behavioral:  Negative for depression. The patient is not nervous/anxious.       PHYSICAL EXAMINATION   Onc Performance Status - 06/05/23 1532       ECOG Perf Status   ECOG Perf Status Restricted in physically strenuous activity but ambulatory and able to carry out work of a light or sedentary nature, e.g., light house work, office work      KPS SCALE   KPS % SCORE Able to carry on normal activity, minor s/s of disease             Vitals:   06/05/23 1514  BP: 115/75  Pulse: 76  Resp: 18  Temp: 98.5 F (36.9 C)  SpO2: 100%     Physical Exam Constitutional:      General: She is not in acute distress.    Appearance: Normal appearance. She is not toxic-appearing.  HENT:     Head: Normocephalic and atraumatic.     Mouth/Throat:     Mouth: Mucous membranes are moist.     Pharynx: Oropharynx is clear. No oropharyngeal exudate or posterior oropharyngeal erythema.  Eyes:  General: No scleral icterus. Cardiovascular:     Rate and Rhythm: Normal rate and regular rhythm.     Pulses: Normal pulses.     Heart sounds: Normal heart sounds.  Pulmonary:     Effort: Pulmonary effort is normal.     Breath sounds: Normal breath sounds.  Abdominal:     General: Abdomen is flat. Bowel sounds are normal. There is no distension.     Palpations: Abdomen is soft.     Tenderness: There is no abdominal tenderness.  Musculoskeletal:        General: Tenderness (Tenderness noted over right sciatic notch) present. No swelling.     Cervical back: Neck supple.  Lymphadenopathy:     Cervical: No cervical adenopathy.  Skin:    General: Skin is warm and dry.     Findings: No rash.  Neurological:     General: No focal deficit present.     Mental Status: She is alert.  Psychiatric:        Mood and Affect: Mood normal.        Behavior: Behavior normal.     LABORATORY DATA:  CBC    Component Value Date/Time   WBC 6.4 12/20/2022 1412   WBC 5.6 07/21/2019 1414   RBC 5.07 12/20/2022 1412   HGB 14.9 12/20/2022 1412   HCT 43.8 12/20/2022 1412   PLT 304 12/20/2022 1412   MCV 86.4 12/20/2022 1412   MCH 29.4 12/20/2022 1412   MCHC 34.0 12/20/2022 1412   RDW 12.0 12/20/2022 1412   LYMPHSABS 1.7 12/20/2022 1412   MONOABS 0.3 12/20/2022 1412   EOSABS 0.1 12/20/2022 1412   BASOSABS 0.0 12/20/2022 1412    CMP     Component Value Date/Time   NA 138 12/20/2022 1412   K 3.8 12/20/2022 1412   CL 100 12/20/2022 1412   CO2 31 12/20/2022 1412   GLUCOSE 111 (H) 12/20/2022 1412   BUN 8 12/20/2022 1412   CREATININE 0.72  12/20/2022 1412   CALCIUM 9.8 12/20/2022 1412   PROT 8.6 (H) 12/20/2022 1412   ALBUMIN 5.4 (H) 12/20/2022 1412   AST 24 12/20/2022 1412   ALT 33 12/20/2022 1412   ALKPHOS 107 12/20/2022 1412   BILITOT 0.5 12/20/2022 1412   GFRNONAA >60 12/20/2022 1412   GFRAA >60 07/22/2020 1251     RADIOGRAPHIC STUDIES:  DG Lumbar Spine 2-3 Views  Result Date: 06/04/2023 CLINICAL DATA:  Back and hip pain EXAM: LUMBAR SPINE - 2-3 VIEW COMPARISON:  None Available. FINDINGS: There is no evidence of lumbar spine fracture. Alignment is normal. Intervertebral disc spaces are maintained. Cholecystectomy clips are present. IMPRESSION: Negative. Electronically Signed   By: Darliss Cheney M.D.   On: 06/04/2023 17:42   DG HIPS BILAT WITH PELVIS 3-4 VIEWS  Result Date: 06/04/2023 CLINICAL DATA:  Back and hip pain. Pain worse with ambulation. History of breast cancer. EXAM: DG HIP (WITH OR WITHOUT PELVIS) 3-4V BILAT COMPARISON:  None Available. FINDINGS: The hip joint spaces are preserved. The femoral heads are well seated in the acetabulum. No fracture. No erosion, avascular necrosis or bony destruction. There is no evidence of focal bone lesion. Pubic symphysis and sacroiliac joints are congruent. IMPRESSION: Unremarkable radiographs of the pelvis and hips. Specifically, no radiographic evidence of metastatic disease. As clinically indicated, recommend further assessment with elective MRI. Electronically Signed   By: Narda Rutherford M.D.   On: 06/04/2023 17:42        ASSESSMENT and THERAPY PLAN:  Malignant neoplasm of upper-inner quadrant of right breast in female, estrogen receptor negative (HCC) Raynelle Fanning is a 37 year old woman with history of stage IIb triple negative breast cancer diagnosed in 2020 here today for follow-up of increasing lower back pain.  She has no radiographic signs on x-rays that she has recurrence of her breast cancer.  I am concerned about this lower back pain however and the impact it is  having on her mobility and the strength in her legs.  Since her x-rays were negative I did order an MRI of the lumbar spine to further evaluate.  I also prescribed a Medrol Dosepak for her to take.  We will follow-up with her after she undergoes the MRI to discuss next steps.   All questions were answered. The patient knows to call the clinic with any problems, questions or concerns. We can certainly see the patient much sooner if necessary.  Total encounter time:20 minutes*in face-to-face visit time, chart review, lab review, care coordination, order entry, and documentation of the encounter time.    Lillard Anes, NP 06/05/23 3:56 PM Medical Oncology and Hematology [facility] [address] [place], [place] [address] Tel. [phone]    Fax. [phone]  *Total Encounter Time as defined by the Centers for Medicare and Medicaid Services includes, in addition to the face-to-face time of a patient visit (documented in the note above) non-face-to-face time: obtaining and reviewing outside history, ordering and reviewing medications, tests or procedures, care coordination (communications with other health care professionals or caregivers) and documentation in the medical record.

## 2023-06-10 ENCOUNTER — Ambulatory Visit (HOSPITAL_COMMUNITY)
Admission: RE | Admit: 2023-06-10 | Discharge: 2023-06-10 | Disposition: A | Discharge status: Home / Self Care | Payer: 59 | Source: Ambulatory Visit | Attending: Adult Health | Admitting: Adult Health

## 2023-06-10 DIAGNOSIS — M5126 Other intervertebral disc displacement, lumbar region: Secondary | ICD-10-CM | POA: Diagnosis not present

## 2023-06-10 DIAGNOSIS — Z171 Estrogen receptor negative status [ER-]: Secondary | ICD-10-CM | POA: Diagnosis not present

## 2023-06-10 DIAGNOSIS — M5441 Lumbago with sciatica, right side: Secondary | ICD-10-CM | POA: Insufficient documentation

## 2023-06-10 DIAGNOSIS — C50211 Malignant neoplasm of upper-inner quadrant of right female breast: Secondary | ICD-10-CM | POA: Diagnosis not present

## 2023-06-10 MED ORDER — GADOBUTROL 1 MMOL/ML IV SOLN
7.0000 mL | Freq: Once | INTRAVENOUS | Status: AC | PRN
  Administered 2023-06-10: 7 mL via INTRAVENOUS

## 2023-06-13 ENCOUNTER — Telehealth: Payer: Self-pay

## 2023-06-13 ENCOUNTER — Other Ambulatory Visit: Payer: Self-pay

## 2023-06-13 DIAGNOSIS — M5441 Lumbago with sciatica, right side: Secondary | ICD-10-CM

## 2023-06-13 NOTE — Telephone Encounter (Signed)
Discussed with Pt that per NP, the bulge and protrusion are so small that they are unlikely the cause of pain. Per NP neurosurgery would not address this scan as the impression is within normal limits. Per NP, PT would be the best option to treat pain, but we are happy to send a referral to sports medicine as a second opinion. Pt agreeable to sports medicine referral. Referral placed to Dr. [name] with [facility]. Pt verbalized understanding.

## 2023-06-13 NOTE — Telephone Encounter (Signed)
-----   Message from Loa Socks, NP sent at 06/13/2023  8:39 AM EDT ----- MRI doesn't show cancer or any disc issues causing her pain.  I would recommend PT.  Please let [name] know and I will place referral if she is agreeable.   Thanks, LC ----- Message ----- From: Interface, Rad Results In Sent: 06/12/2023   4:40 PM EDT To: Loa Socks, NP

## 2023-06-13 NOTE — Telephone Encounter (Signed)
Called with below message. Pt expresses concerns over part of impression listed below:  "L2-L3: There is a shallow left foraminal disc protrusion without significant spinal canal or neural foraminal stenosis.   L3-L4: There is a minimal disc bulge without significant spinal canal or neural foraminal stenosis   L4-L5: There is a shallow central protrusion and right subarticular/foraminal annular fissure without significant spinal canal or neural foraminal stenosis."   Pt states "I see words like bulge and protrusion, this MRI is not normal". Pt also reports feelings of "cold sensation, like water is running down my back".  Will route to NP to advise.

## 2023-07-03 DIAGNOSIS — J069 Acute upper respiratory infection, unspecified: Secondary | ICD-10-CM | POA: Diagnosis not present

## 2023-07-03 DIAGNOSIS — R350 Frequency of micturition: Secondary | ICD-10-CM | POA: Diagnosis not present

## 2023-07-03 DIAGNOSIS — Z6828 Body mass index (BMI) 28.0-28.9, adult: Secondary | ICD-10-CM | POA: Diagnosis not present

## 2023-09-06 ENCOUNTER — Inpatient Hospital Stay: Payer: 59 | Attending: Adult Health | Admitting: Hematology and Oncology

## 2023-09-06 NOTE — Assessment & Plan Note (Deleted)
02/01/2018:Screening detected right breast mass in the upper inner quadrant measuring 1.5 cm, with additional enhancement it measured 2.2 cm.  No axillary lymph nodes.  Biopsy revealed IDC with DCIS, grade 3, ER 0%, PR 0%, HER-2 negative, Ki-67 80%, T1CN0 stage Ib clinical stage   Recommendation: 1.  Genetic counseling and testing 2. neoadjuvant chemotherapy with dose dense Adriamycin and Cytoxan every 2 weeks x4 followed by Taxol weekly x12 (changed to Abraxane due to rash)  accompanied by carboplatin every 3 weeks x4 completed 07/05/2018 3.  Genetics BRCA1 mutation and MLH1 4.  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 Patient decided not to take adjuvant olaparib.   01/18/2023: MRI abdomen: Benign lesion in the liver [place] steatosis 06/12/2023: MRI lumbar spine: Benign  Surveillance: Chest wall exam: Benign, no role of mammograms. Return to clinic in 1 year for follow-up with long-term survivorship

## 2023-10-11 ENCOUNTER — Other Ambulatory Visit: Payer: Self-pay | Admitting: Hematology and Oncology

## 2023-10-18 DIAGNOSIS — C50919 Malignant neoplasm of unspecified site of unspecified female breast: Secondary | ICD-10-CM | POA: Diagnosis not present

## 2023-10-18 DIAGNOSIS — M79621 Pain in right upper arm: Secondary | ICD-10-CM | POA: Diagnosis not present

## 2023-10-18 DIAGNOSIS — M542 Cervicalgia: Secondary | ICD-10-CM | POA: Diagnosis not present

## 2023-10-20 ENCOUNTER — Encounter: Payer: Self-pay | Admitting: Hematology and Oncology

## 2023-10-22 ENCOUNTER — Encounter: Payer: Self-pay | Admitting: Adult Health

## 2023-10-22 NOTE — Telephone Encounter (Signed)
Scheduled appointments per referral. Patient is aware of the appointment time and date as well as the address. Patient was informed to arrive 10-15 minutes prior with updated insurance information. All questions were answered.

## 2023-10-26 ENCOUNTER — Encounter: Payer: 59 | Admitting: Adult Health

## 2023-10-27 ENCOUNTER — Telehealth: Payer: Self-pay | Admitting: Adult Health

## 2023-10-27 NOTE — Telephone Encounter (Signed)
Vm left with patient to r/s her missed appointment from 11/1.

## 2023-10-27 NOTE — Telephone Encounter (Signed)
 Left patient a vm regarding upcoming appointment

## 2023-10-30 ENCOUNTER — Telehealth: Payer: Self-pay | Admitting: Adult Health

## 2023-10-30 NOTE — Telephone Encounter (Signed)
Left patient a message in regards in to new appointment dates left callback number if patient needed rescheduling

## 2023-11-02 DIAGNOSIS — Z853 Personal history of malignant neoplasm of breast: Secondary | ICD-10-CM | POA: Diagnosis not present

## 2023-11-02 DIAGNOSIS — M25569 Pain in unspecified knee: Secondary | ICD-10-CM | POA: Diagnosis not present

## 2023-11-02 DIAGNOSIS — R030 Elevated blood-pressure reading, without diagnosis of hypertension: Secondary | ICD-10-CM | POA: Diagnosis not present

## 2023-11-02 DIAGNOSIS — R5383 Other fatigue: Secondary | ICD-10-CM | POA: Diagnosis not present

## 2023-11-02 DIAGNOSIS — M542 Cervicalgia: Secondary | ICD-10-CM | POA: Diagnosis not present

## 2023-11-02 DIAGNOSIS — Z6828 Body mass index (BMI) 28.0-28.9, adult: Secondary | ICD-10-CM | POA: Diagnosis not present

## 2023-11-02 DIAGNOSIS — M2550 Pain in unspecified joint: Secondary | ICD-10-CM | POA: Diagnosis not present

## 2023-11-06 ENCOUNTER — Inpatient Hospital Stay: Payer: 59 | Admitting: Adult Health

## 2023-11-06 LAB — COMPREHENSIVE METABOLIC PANEL WITH GFR: EGFR (Non-African Amer.): 116

## 2023-11-09 ENCOUNTER — Inpatient Hospital Stay: Payer: 59 | Attending: Adult Health | Admitting: Adult Health

## 2023-11-09 ENCOUNTER — Ambulatory Visit: Payer: 59 | Admitting: Adult Health

## 2023-11-09 ENCOUNTER — Other Ambulatory Visit: Payer: Self-pay | Admitting: Adult Health

## 2023-11-09 ENCOUNTER — Encounter: Payer: Self-pay | Admitting: Adult Health

## 2023-11-09 ENCOUNTER — Ambulatory Visit (HOSPITAL_COMMUNITY)
Admission: RE | Admit: 2023-11-09 | Discharge: 2023-11-09 | Disposition: A | Discharge status: Home / Self Care | Payer: 59 | Source: Ambulatory Visit | Attending: Adult Health | Admitting: Adult Health

## 2023-11-09 VITALS — BP 118/70 | HR 86 | Temp 98.0°F | Resp 19 | Wt 152.5 lb

## 2023-11-09 DIAGNOSIS — M25462 Effusion, left knee: Secondary | ICD-10-CM | POA: Diagnosis not present

## 2023-11-09 DIAGNOSIS — M25561 Pain in right knee: Secondary | ICD-10-CM | POA: Diagnosis not present

## 2023-11-09 DIAGNOSIS — M79601 Pain in right arm: Secondary | ICD-10-CM | POA: Insufficient documentation

## 2023-11-09 DIAGNOSIS — Z801 Family history of malignant neoplasm of trachea, bronchus and lung: Secondary | ICD-10-CM | POA: Insufficient documentation

## 2023-11-09 DIAGNOSIS — Z803 Family history of malignant neoplasm of breast: Secondary | ICD-10-CM | POA: Insufficient documentation

## 2023-11-09 DIAGNOSIS — Z171 Estrogen receptor negative status [ER-]: Secondary | ICD-10-CM | POA: Diagnosis not present

## 2023-11-09 DIAGNOSIS — M5412 Radiculopathy, cervical region: Secondary | ICD-10-CM | POA: Diagnosis not present

## 2023-11-09 DIAGNOSIS — Z9221 Personal history of antineoplastic chemotherapy: Secondary | ICD-10-CM | POA: Insufficient documentation

## 2023-11-09 DIAGNOSIS — M25562 Pain in left knee: Secondary | ICD-10-CM | POA: Insufficient documentation

## 2023-11-09 DIAGNOSIS — Z800 Family history of malignant neoplasm of digestive organs: Secondary | ICD-10-CM | POA: Insufficient documentation

## 2023-11-09 DIAGNOSIS — Z87891 Personal history of nicotine dependence: Secondary | ICD-10-CM | POA: Insufficient documentation

## 2023-11-09 DIAGNOSIS — M546 Pain in thoracic spine: Secondary | ICD-10-CM

## 2023-11-09 DIAGNOSIS — C50211 Malignant neoplasm of upper-inner quadrant of right female breast: Secondary | ICD-10-CM

## 2023-11-09 DIAGNOSIS — Z809 Family history of malignant neoplasm, unspecified: Secondary | ICD-10-CM | POA: Insufficient documentation

## 2023-11-09 DIAGNOSIS — M1712 Unilateral primary osteoarthritis, left knee: Secondary | ICD-10-CM | POA: Diagnosis not present

## 2023-11-09 DIAGNOSIS — Z9013 Acquired absence of bilateral breasts and nipples: Secondary | ICD-10-CM | POA: Insufficient documentation

## 2023-11-09 NOTE — Progress Notes (Unsigned)
[facility] Cancer Follow up:    Jamie Pandy, MD [address] [place] [address]   DIAGNOSIS: Cancer Staging  Malignant neoplasm of upper-inner quadrant of right breast in female, estrogen receptor negative (HCC) Staging form: Breast, AJCC 8th Edition - Clinical: Stage IIB (cT2, cN0, cM0, G3, ER-, PR-, HER2-) - Unsigned Histologic grading system: 3 grade system - Pathologic: pT0, pN0, cM0 - Unsigned   SUMMARY OF ONCOLOGIC HISTORY: Oncology History  Malignant neoplasm of upper-inner quadrant of right breast in female, estrogen receptor negative (HCC)  02/01/2018 Initial Diagnosis   Screening detected right breast mass in the upper inner quadrant measuring 1.5 cm, with additional enhancement it measured 2.2 cm.  No axillary lymph nodes.  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  The Hereditary Gene Panel offered by [facility] includes sequencing and/or deletion duplication testing of the following 47 genes: APC, ATM, AXIN2, BARD1, BMPR1A, BRCA1, BRCA2, BRIP1, CDH1, CDK4, CDKN2A (p14ARF), CDKN2A (p16INK4a), CHEK2, CTNNA1, DICER1, EPCAM (Deletion/duplication testing only), GREM1 (promoter region deletion/duplication testing only), KIT, MEN1, MLH1, MSH2, MSH3, MSH6, MUTYH, NBN, NF1, NHTL1, PALB2, PDGFRA, PMS2, POLD1, POLE, PTEN, RAD50, RAD51C, RAD51D, SDHB, SDHC, SDHD, SMAD4, SMARCA4. STK11, TP53, TSC1, TSC2, and VHL.  The following genes were evaluated for sequence changes only: SDHA and HOXB13 c.251G>A variant only. The report date is February 20, 2018.    02/20/2018 - 07/05/2018 Neo-Adjuvant Chemotherapy   Neoadjuvant dose dense Adriamycin and Cytoxan x4 followed by Taxol and carboplatin (received 8 cycles of Taxol and 3 cycles of carboplatin)    07/29/2018 Surgery   Bilateral mastectomies: Right mastectomy benign, 0/6 lymph nodes; left mastectomy: Benign, pathologic complete response   07/22/2019 Surgery   Total hysterectomy and bilateral salpingoophorectomy     CURRENT THERAPY: observation  INTERVAL HISTORY:  Discussed the use of AI scribe software for clinical note transcription with the patient, who gave verbal consent to proceed.  [name] 37 y.o. female  with a history of breast cancer and early menopause, presents with persistent and recurrent pain in various locations, most notably in the right arm, shoulder, neck, and knee. The pain in the right arm, which initially started in the shoulder and neck, was associated with swelling and pain in the armpit. The pain was throbbing in nature and was initially managed with unspecified medications. The patient also reports a sensation of tingling and numbness in the hands and feet.  The patient has been experiencing these symptoms for approximately five years, with periods of relief followed by recurrence. They have sought care from multiple providers, including a family doctor and a rheumatologist, but the cause of the symptoms remains unclear. The patient has had multiple imaging studies, including X-rays and CT scans, which have not revealed any significant abnormalities.  The patient also reports a significant amount of hair loss, which they attribute to menopause and hot flashes. They have a family history of breast cancer, lupus, rheumatoid arthritis, and Graves' disease, raising concerns about a possible autoimmune component to their symptoms.  The patient acknowledges a need for lifestyle changes, particularly in their diet, which is high in processed foods and soda. They have tried taking turmeric for pain management and have noticed some improvement. The patient expresses a desire for  answers and a definitive diagnosis to help manage their symptoms better.   Patient  Active Problem List   Diagnosis Date Noted   Superficial vein thrombosis 09/11/2022   Vasomotor symptoms due to menopause 08/22/2019   BRCA1 gene mutation positive 02/20/2018   Genetic testing 02/20/2018   Family history of breast cancer    Family history of colon cancer    Family history of stomach cancer    Malignant neoplasm of upper-inner quadrant of right breast in female, estrogen receptor negative (HCC)    Acid reflux 01/18/2018   Diabetes mellitus arising in pregnancy 01/18/2018   H/O tubal ligation 01/18/2018   History of palpitations 01/18/2018   Umbilical swelling 01/18/2018   Hypokalemia 10/16/2017   Cervicalgia 11/22/2016   Low back pain 11/22/2016   Pain in thoracic spine 11/22/2016   Hand, foot and mouth disease 11/08/2015   Incisional hernia, without obstruction or gangrene 09/03/2015   Acute serous otitis media 07/11/2015   Abdominal pain of other specified site 04/01/2015   Umbilical hernia without obstruction and without gangrene 01/05/2014   Postpartum care following cesarean delivery (11/26) 11/19/2013   Acute sinusitis 09/15/2013   Irregular heart rate 08/20/2013   Heart palpitations 08/20/2013   Mastodynia 12/09/2012   Abdominal pain, epigastric 03/29/2012   Nausea without vomiting 03/29/2012   Intractable migraine 01/10/2012   Lumbosacral ligament sprain 01/10/2012   Thoracic back sprain 01/10/2012   Influenza with respiratory manifestation 12/20/2011   Dysuria 10/11/2011   Lumbar sprain 10/11/2011   Benign paroxysmal positional vertigo 06/01/2011   Other malaise and fatigue 06/01/2011    is allergic to lidocaine.  MEDICAL HISTORY: Past Medical History:  Diagnosis Date   breast ca dx'd 01/2018   right   Cluster headaches    Complication of anesthesia    severe hypotension to the point of syncope   Family history of breast cancer    Family history of colon cancer    Family history of stomach cancer    History of breast cancer 01/2018    right   History of chemotherapy    finished 07/05/2018   History of palpitations    due to low K+   Hyperthyroidism    no current med.   Infection of left breast 11/11/2018   to start antibiotic 11/11/2018   Irritable bowel syndrome (IBS)    no current med.    SURGICAL HISTORY: Past Surgical History:  Procedure Laterality Date   BREAST RECONSTRUCTION WITH PLACEMENT OF TISSUE EXPANDER AND ALLODERM Bilateral 07/29/2018   Procedure: BILATERAL BREAST RECONSTRUCTION WITH PLACEMENT OF TISSUE EXPANDER AND ALLODERM;  Surgeon: Glenna Fellows, MD;  Location: [facility];  Service: Plastics;  Laterality: Bilateral;   CESAREAN SECTION     CESAREAN SECTION WITH BILATERAL TUBAL LIGATION Bilateral 11/19/2013   Procedure: REPEAT CESAREAN SECTION WITH BILATERAL TUBAL LIGATION; TWINS;  Surgeon: Lenoard Aden, MD;  Location: [facility];  Service: Obstetrics;  Laterality: Bilateral;  EDD: 12/09/13   CHOLECYSTECTOMY     PORTA CATH REMOVAL Right 11/19/2018   Procedure: PORTA CATH REMOVAL;  Surgeon: Glenna Fellows, MD;  Location: [facility];  Service: Plastics;  Laterality: Right;   PORTACATH PLACEMENT N/A 02/20/2018   Procedure: INSERTION PORT-A-CATH WITH ULTRASOUND;  Surgeon: Emelia Loron, MD;  Location: [facility];  Service: General;  Laterality: N/A;   REMOVAL OF BILATERAL TISSUE EXPANDERS WITH PLACEMENT OF BILATERAL BREAST IMPLANTS Bilateral 11/19/2018   Procedure: REMOVAL OF BILATERAL TISSUE EXPANDERS WITH PLACEMENT OF  BILATERAL BREAST IMPLANTS;  Surgeon: Glenna Fellows, MD;  Location: [facility];  Service: Plastics;  Laterality: Bilateral;   ROBOTIC ASSISTED TOTAL HYSTERECTOMY WITH BILATERAL SALPINGO OOPHERECTOMY Bilateral 07/22/2019   Procedure: XI ROBOTIC ASSISTED TOTAL HYSTERECTOMY WITH BILATERAL SALPINGO OOPHORECTOMY WITH UMBILICAL HERNIA REPAIR;  Surgeon: Adolphus Birchwood, MD;  Location: [facility];  Service: Gynecology;  Laterality:  Bilateral;    SOCIAL HISTORY: Social History   Socioeconomic History   Marital status: Married    Spouse name: Not on file   Number of children: Not on file   Years of education: Not on file   Highest education level: Not on file  Occupational History   Not on file  Tobacco Use   Smoking status: Former    Current packs/day: 0.00    Types: Cigarettes    Quit date: 12/25/2003    Years since quitting: 19.8   Smokeless tobacco: Never  Vaping Use   Vaping status: Never Used  Substance and Sexual Activity   Alcohol use: No   Drug use: No   Sexual activity: Not Currently  Other Topics Concern   Not on file  Social History Narrative   Not on file   Social Determinants of Health   Financial Resource Strain: Not on file  Food Insecurity: Not on file  Transportation Needs: Not on file  Physical Activity: Not on file  Stress: Not on file  Social Connections: Unknown (05/09/2022)   Received from [facility]   Social Network    Social Network: Not on file  Intimate Partner Violence: Unknown (03/31/2022)   Received from [facility]   HITS    Physically Hurt: Not on file    Insult or Talk Down To: Not on file    Threaten Physical Harm: Not on file    Scream or Curse: Not on file    FAMILY HISTORY: Family History  Problem Relation Age of Onset   Cirrhosis Father    Breast cancer Paternal Aunt 2       BRCA1 p.E908 pos   Heart disease Maternal Grandfather        Heart attack   Breast cancer Paternal Aunt        dx in her 30s   Colon cancer Maternal Aunt        dx in her 30s   Lupus Sister        pat 1/2 sister   Other Brother 16       pat 1/2 brother died in MVA   Lung cancer Maternal Uncle        smoker   Other Paternal Aunt        died in a MVA   Other Paternal Aunt        died from poisioning    Review of Systems  Constitutional:  Negative for appetite change, chills, fatigue, fever and unexpected weight change.  HENT:    Negative for hearing loss, lump/mass and trouble swallowing.   Eyes:  Negative for eye problems and icterus.  Respiratory:  Negative for chest tightness, cough and shortness of breath.   Cardiovascular:  Negative for chest pain, leg swelling and palpitations.  Gastrointestinal:  Negative for abdominal distention, abdominal pain, constipation, diarrhea, nausea and vomiting.  Endocrine: Negative for hot flashes.  Genitourinary:  Negative for difficulty urinating.   Musculoskeletal:  Negative for arthralgias.  Skin:  Negative for itching and rash.  Neurological:  Negative for dizziness, extremity weakness, headaches and numbness.  Hematological:  Negative for adenopathy. Does not bruise/bleed easily.  Psychiatric/Behavioral:  Negative for depression. The patient is not nervous/anxious.       PHYSICAL EXAMINATION   Onc Performance Status - 11/09/23 0800       KPS SCALE   KPS % SCORE Able to carry on normal activity, minor s/s of disease             Vitals:   11/09/23 0838  BP: 118/70  Pulse: 86  Resp: 19  Temp: 98 F (36.7 C)  SpO2: 100%    Physical Exam Constitutional:      General: She is not in acute distress.    Appearance: Normal appearance. She is not toxic-appearing.  HENT:     Head: Normocephalic and atraumatic.     Mouth/Throat:     Mouth: Mucous membranes are moist.     Pharynx: Oropharynx is clear. No oropharyngeal exudate or posterior oropharyngeal erythema.  Eyes:     General: No scleral icterus. Cardiovascular:     Rate and Rhythm: Normal rate and regular rhythm.     Pulses: Normal pulses.     Heart sounds: Normal heart sounds.  Pulmonary:     Effort: Pulmonary effort is normal.     Breath sounds: Normal breath sounds.  Abdominal:     General: Abdomen is flat. Bowel sounds are normal. There is no distension.     Palpations: Abdomen is soft.     Tenderness: There is no abdominal tenderness.  Musculoskeletal:        General: No swelling.      Cervical back: Neck supple.  Lymphadenopathy:     Cervical: No cervical adenopathy.  Skin:    General: Skin is warm and dry.     Findings: No rash.  Neurological:     General: No focal deficit present.     Mental Status: She is alert.  Psychiatric:        Mood and Affect: Mood normal.        Behavior: Behavior normal.     LABORATORY DATA:  CBC    Component Value Date/Time   WBC 6.4 12/20/2022 1412   WBC 5.6 07/21/2019 1414   RBC 5.07 12/20/2022 1412   HGB 14.9 12/20/2022 1412   HCT 43.8 12/20/2022 1412   PLT 304 12/20/2022 1412   MCV 86.4 12/20/2022 1412   MCH 29.4 12/20/2022 1412   MCHC 34.0 12/20/2022 1412   RDW 12.0 12/20/2022 1412   LYMPHSABS 1.7 12/20/2022 1412   MONOABS 0.3 12/20/2022 1412   EOSABS 0.1 12/20/2022 1412   BASOSABS 0.0 12/20/2022 1412    CMP     Component Value Date/Time   NA 138 12/20/2022 1412   K 3.8 12/20/2022 1412   CL 100 12/20/2022 1412   CO2 31 12/20/2022 1412   GLUCOSE 111 (H) 12/20/2022 1412   BUN 8 12/20/2022 1412   CREATININE 0.72 12/20/2022 1412   CALCIUM 9.8 12/20/2022 1412   PROT 8.6 (H) 12/20/2022 1412   ALBUMIN 5.4 (H) 12/20/2022 1412   AST 24 12/20/2022 1412   ALT 33 12/20/2022 1412   ALKPHOS 107 12/20/2022 1412   BILITOT 0.5 12/20/2022 1412   GFRNONAA >60 12/20/2022 1412   GFRAA >60 07/22/2020 1251       PENDING LABS:   RADIOGRAPHIC STUDIES:  No results found.   PATHOLOGY:     ASSESSMENT and THERAPY PLAN:   No problem-specific Assessment & Plan notes found for this encounter.   No orders  of the defined types were placed in this encounter.   All questions were answered. The patient knows to call the clinic with any problems, questions or concerns. We can certainly see the patient much sooner if necessary. This note was electronically signed. Noreene Filbert, NP 11/09/2023

## 2023-11-11 NOTE — Assessment & Plan Note (Signed)
[name] is a 37 year old woman with history of stage IIb triple negative breast cancer diagnosed in 2020 status post neoadjuvant chemotherapy followed by bilateral mastectomies.  [name] has a longstanding history of pain since completing her neoadjuvant chemotherapy throughout the past several years.  She has undergone many tests both through imaging and labs that have not indicated an etiology of her pain.  A rheumatology referral is pending.  Right Arm Pain Extends to the shoulder and neck. Previous imaging (x-ray) showed no abnormalities. Possible pinched nerve. -Order MRI of cervical and thoracic spine to assess for nerve impingement. -She is managing discomfort with tylenol and aleve and reports satisfaction with this approach.    Right Knee Pain Patient reports pain and popping sensation in the right knee, limiting mobility and daily activities. No previous imaging. -Order x-ray of the right knee. -Consider referral to sports medicine depending on x-ray results.  General Health Maintenance Patient reports hair loss and tingling in hands and feet. Family history of autoimmune diseases. Patient has been referred to a rheumatologist. -Encourage patient to continue with rheumatology appointment. -Advise patient on dietary changes to reduce processed food intake.  RTC based on above results.

## 2023-11-15 ENCOUNTER — Telehealth: Payer: Self-pay

## 2023-11-15 DIAGNOSIS — M25562 Pain in left knee: Secondary | ICD-10-CM

## 2023-11-15 NOTE — Telephone Encounter (Signed)
-----   Message from Noreene Filbert sent at 11/14/2023 11:03 AM EST ----- Please let patient know that the x-ray shows some wear and tear along with fluid on the knee.  I would recommend a referral to sports medicine to consider removing the fluid and injecting the joint. ----- Message ----- From: Interface, Rad Results In Sent: 11/12/2023   9:42 PM EST To: Loa Socks, NP

## 2023-11-15 NOTE — Telephone Encounter (Signed)
Called pt to give results. Left detailed message on identified voicemail advising referral to sports medicine, Dr Jean Rosenthal.   Referral entered per NP and phone number provided on VM for scheduling.

## 2023-11-19 NOTE — Progress Notes (Deleted)
    Aleen Sells D.[facility] [address] [place] [address] Phone: [phone]   Assessment and Plan:     There are no diagnoses linked to this encounter.  ***   Pertinent previous records reviewed include ***    Follow Up: ***     Subjective:   I, Rosielee Corporan, am serving as a Neurosurgeon for Doctor Richardean Sale  Chief Complaint: left knee pain   HPI:   11/21/2023 Patient is a 37 year old female with left knee pain. Patient states  Relevant Historical Information: ***  Additional pertinent review of systems negative.   Current Outpatient Medications:    venlafaxine (EFFEXOR) 75 MG tablet, Take 1 tablet by mouth once daily, Disp: 90 tablet, Rfl: 0   Objective:     There were no vitals filed for this visit.    There is no height or weight on file to calculate BMI.    Physical Exam:    ***   Electronically signed by:  Aleen Sells D.[facility] 4:10 PM 11/19/23

## 2023-11-21 ENCOUNTER — Telehealth: Payer: Self-pay | Admitting: *Deleted

## 2023-11-21 ENCOUNTER — Ambulatory Visit: Payer: 59 | Admitting: Sports Medicine

## 2023-11-21 NOTE — Telephone Encounter (Signed)
Received call from pt with complaint of hair loss and requesting advice from MD on lab work to determine cause.  Per MD pt needing to f/u with PCP to have thyroid checked.  Pt verbalized understanding.

## 2023-11-25 ENCOUNTER — Ambulatory Visit (HOSPITAL_COMMUNITY): Payer: 59

## 2023-11-27 ENCOUNTER — Telehealth: Payer: Self-pay

## 2023-11-27 NOTE — Telephone Encounter (Signed)
Called pt to give her new schedule for Mri's at [facility] imaging. Pt Mri's was canceled here at [facility] due to insurance. Pt did not understand contacted Brandi Harvell to contact her to explain

## 2023-11-30 ENCOUNTER — Ambulatory Visit (HOSPITAL_COMMUNITY): Payer: 59

## 2023-11-30 ENCOUNTER — Ambulatory Visit (HOSPITAL_COMMUNITY)
Admission: RE | Admit: 2023-11-30 | Discharge: 2023-11-30 | Disposition: A | Discharge status: Home / Self Care | Payer: 59 | Source: Ambulatory Visit | Attending: Adult Health | Admitting: Adult Health

## 2023-11-30 DIAGNOSIS — M5412 Radiculopathy, cervical region: Secondary | ICD-10-CM

## 2023-11-30 DIAGNOSIS — Z171 Estrogen receptor negative status [ER-]: Secondary | ICD-10-CM | POA: Diagnosis not present

## 2023-11-30 DIAGNOSIS — D1809 Hemangioma of other sites: Secondary | ICD-10-CM | POA: Diagnosis not present

## 2023-11-30 DIAGNOSIS — C50211 Malignant neoplasm of upper-inner quadrant of right female breast: Secondary | ICD-10-CM | POA: Diagnosis not present

## 2023-11-30 DIAGNOSIS — C50911 Malignant neoplasm of unspecified site of right female breast: Secondary | ICD-10-CM | POA: Diagnosis not present

## 2023-11-30 DIAGNOSIS — M546 Pain in thoracic spine: Secondary | ICD-10-CM

## 2023-11-30 DIAGNOSIS — C50919 Malignant neoplasm of unspecified site of unspecified female breast: Secondary | ICD-10-CM | POA: Diagnosis not present

## 2023-11-30 DIAGNOSIS — M4802 Spinal stenosis, cervical region: Secondary | ICD-10-CM | POA: Diagnosis not present

## 2023-11-30 MED ORDER — GADOBUTROL 1 MMOL/ML IV SOLN
7.0000 mL | Freq: Once | INTRAVENOUS | Status: AC | PRN
  Administered 2023-11-30: 7 mL via INTRAVENOUS

## 2023-12-10 ENCOUNTER — Telehealth: Payer: Self-pay

## 2023-12-10 ENCOUNTER — Inpatient Hospital Stay: Payer: 59 | Attending: Adult Health | Admitting: Adult Health

## 2023-12-10 DIAGNOSIS — G8929 Other chronic pain: Secondary | ICD-10-CM | POA: Diagnosis not present

## 2023-12-10 DIAGNOSIS — M546 Pain in thoracic spine: Secondary | ICD-10-CM

## 2023-12-10 DIAGNOSIS — C50211 Malignant neoplasm of upper-inner quadrant of right female breast: Secondary | ICD-10-CM

## 2023-12-10 DIAGNOSIS — Z171 Estrogen receptor negative status [ER-]: Secondary | ICD-10-CM

## 2023-12-10 MED ORDER — NAPROXEN 500 MG PO TABS: 500.0000 mg | ORAL_TABLET | Freq: Two times a day (BID) | ORAL | 0 refills | Status: DC

## 2023-12-10 NOTE — Progress Notes (Signed)
[facility] Cancer Follow up:    Jamie Pandy, MD [address] [place] [address]   DIAGNOSIS:  Cancer Staging  Malignant neoplasm of upper-inner quadrant of right breast in female, estrogen receptor negative (HCC) Staging form: Breast, AJCC 8th Edition - Clinical: Stage IIB (cT2, cN0, cM0, G3, ER-, PR-, HER2-) - Unsigned Histologic grading system: 3 grade system - Pathologic: pT0, pN0, cM0 - Unsigned  I connected with [name] on 12/10/23 at  2:45 PM EST by telephone and verified that I am speaking with the correct person using two identifiers.  I discussed the limitations, risks, security and privacy concerns of performing an evaluation and management service by telephone and the availability of in person appointments.  I also discussed with the patient that there may be a patient responsible charge related to this service. The patient expressed understanding and agreed to proceed.    SUMMARY OF ONCOLOGIC HISTORY: Oncology History  Malignant neoplasm of upper-inner quadrant of right breast in female, estrogen receptor negative (HCC)  02/01/2018 Initial Diagnosis   Screening detected right breast mass in the upper inner quadrant measuring 1.5 cm, with additional enhancement it measured 2.2 cm.  No axillary lymph nodes.  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  The Hereditary Gene Panel offered by [facility] includes sequencing and/or deletion duplication testing of the following 47 genes: APC, ATM, AXIN2, BARD1, BMPR1A, BRCA1, BRCA2, BRIP1, CDH1, CDK4, CDKN2A (p14ARF), CDKN2A (p16INK4a), CHEK2, CTNNA1, DICER1, EPCAM (Deletion/duplication testing only), GREM1 (promoter region deletion/duplication testing only), KIT, MEN1, MLH1, MSH2,  MSH3, MSH6, MUTYH, NBN, NF1, NHTL1, PALB2, PDGFRA, PMS2, POLD1, POLE, PTEN, RAD50, RAD51C, RAD51D, SDHB, SDHC, SDHD, SMAD4, SMARCA4. STK11, TP53, TSC1, TSC2, and VHL.  The following genes were evaluated for sequence changes only: SDHA and HOXB13 c.251G>A variant only. The report date is February 20, 2018.    02/20/2018 - 07/05/2018 Neo-Adjuvant Chemotherapy   Neoadjuvant dose dense Adriamycin and Cytoxan x4 followed by Taxol and carboplatin (received 8 cycles of Taxol and 3 cycles of carboplatin)   07/29/2018 Surgery   Bilateral mastectomies: Right mastectomy benign, 0/6 lymph nodes; left mastectomy: Benign, pathologic complete response   07/22/2019 Surgery   Total hysterectomy and bilateral salpingoophorectomy     CURRENT THERAPY: observation  INTERVAL HISTORY:  Discussed the use of AI scribe software for clinical note transcription with the patient, who gave verbal consent to proceed.  [name] 37 y.o. female a patient with a history of breast cancer, presents with persistent and severe pain in multiple locations, including the knees, back, and right shoulder blade. The pain is so intense that it disrupts her sleep and daily activities. Despite her usual resilience and preference to avoid medication, the patient reports that the pain is unrelenting and worsening, leading her to seek medical intervention.  The patient suspects a pinched nerve as the cause of her discomfort, particularly in the right shoulder blade and neck area. She also reports pain in the middle of her spine. Both of her knees are swollen, despite her efforts to rest and avoid strenuous activities.  In addition to the physical pain, the patient expresses emotional distress, stating that she is constantly on the verge of tears due to the severity of her symptoms.  She has been using a heating pad and ice for relief and has recently started taking ibuprofen, which has provided minimal relief.  The patient has undergone  multiple MRIs since June, which have revealed some abnormalities. However, she is unsure of the implications of these findings and is seeking clarification and a plan for pain management. She has an upcoming appointment with rheumatology in April and is open to exploring other avenues for relief, including sports medicine and physical therapy.   Patient Active Problem List   Diagnosis Date Noted   Superficial vein thrombosis 09/11/2022   Vasomotor symptoms due to menopause 08/22/2019   BRCA1 gene mutation positive 02/20/2018   Genetic testing 02/20/2018   Family history of breast cancer    Family history of colon cancer    Family history of stomach cancer    Malignant neoplasm of upper-inner quadrant of right breast in female, estrogen receptor negative (HCC)    Acid reflux 01/18/2018   Diabetes mellitus arising in pregnancy 01/18/2018   H/O tubal ligation 01/18/2018   History of palpitations 01/18/2018   Umbilical swelling 01/18/2018   Hypokalemia 10/16/2017   Cervicalgia 11/22/2016   Low back pain 11/22/2016   Pain in thoracic spine 11/22/2016   Hand, foot and mouth disease 11/08/2015   Incisional hernia, without obstruction or gangrene 09/03/2015   Acute serous otitis media 07/11/2015   Abdominal pain of other specified site 04/01/2015   Umbilical hernia without obstruction and without gangrene 01/05/2014   Postpartum care following cesarean delivery (11/26) 11/19/2013   Acute sinusitis 09/15/2013   Irregular heart rate 08/20/2013   Heart palpitations 08/20/2013   Mastodynia 12/09/2012   Abdominal pain, epigastric 03/29/2012   Nausea without vomiting 03/29/2012   Intractable migraine 01/10/2012   Lumbosacral ligament sprain 01/10/2012   Thoracic back sprain 01/10/2012   Influenza with respiratory manifestation 12/20/2011   Dysuria 10/11/2011   Lumbar sprain 10/11/2011   Benign paroxysmal positional vertigo 06/01/2011   Other malaise and fatigue 06/01/2011    is allergic  to lidocaine.  MEDICAL HISTORY: Past Medical History:  Diagnosis Date   breast ca dx'd 01/2018   right   Cluster headaches    Complication of anesthesia    severe hypotension to the point of syncope   Family history of breast cancer    Family history of colon cancer    Family history of stomach cancer    History of breast cancer 01/2018   right   History of chemotherapy    finished 07/05/2018   History of palpitations    due to low K+   Hyperthyroidism    no current med.   Infection of left breast 11/11/2018   to start antibiotic 11/11/2018   Irritable bowel syndrome (IBS)    no current med.    SURGICAL HISTORY: Past Surgical History:  Procedure Laterality Date   BREAST RECONSTRUCTION WITH PLACEMENT OF TISSUE EXPANDER AND ALLODERM Bilateral 07/29/2018   Procedure: BILATERAL BREAST RECONSTRUCTION WITH PLACEMENT OF TISSUE EXPANDER AND ALLODERM;  Surgeon: Glenna Fellows, MD;  Location: [facility];  Service: Plastics;  Laterality: Bilateral;   CESAREAN SECTION     CESAREAN SECTION WITH BILATERAL TUBAL LIGATION Bilateral 11/19/2013   Procedure: REPEAT CESAREAN SECTION WITH BILATERAL TUBAL LIGATION; TWINS;  Surgeon: Lenoard Aden, MD;  Location: [facility];  Service: Obstetrics;  Laterality: Bilateral;  EDD: 12/09/13   CHOLECYSTECTOMY     PORTA CATH REMOVAL Right 11/19/2018   Procedure: PORTA CATH REMOVAL;  Surgeon: Glenna Fellows,  MD;  Location: [facility];  Service: Plastics;  Laterality: Right;   PORTACATH PLACEMENT N/A 02/20/2018   Procedure: INSERTION PORT-A-CATH WITH ULTRASOUND;  Surgeon: Emelia Loron, MD;  Location: [facility];  Service: General;  Laterality: N/A;   REMOVAL OF BILATERAL TISSUE EXPANDERS WITH PLACEMENT OF BILATERAL BREAST IMPLANTS Bilateral 11/19/2018   Procedure: REMOVAL OF BILATERAL TISSUE EXPANDERS WITH PLACEMENT OF BILATERAL BREAST IMPLANTS;  Surgeon: Glenna Fellows, MD;  Location: [facility];  Service: Plastics;  Laterality: Bilateral;   ROBOTIC ASSISTED TOTAL HYSTERECTOMY WITH BILATERAL SALPINGO OOPHERECTOMY Bilateral 07/22/2019   Procedure: XI ROBOTIC ASSISTED TOTAL HYSTERECTOMY WITH BILATERAL SALPINGO OOPHORECTOMY WITH UMBILICAL HERNIA REPAIR;  Surgeon: Adolphus Birchwood, MD;  Location: [facility];  Service: Gynecology;  Laterality: Bilateral;    SOCIAL HISTORY: Social History   Socioeconomic History   Marital status: Married    Spouse name: Not on file   Number of children: Not on file   Years of education: Not on file   Highest education level: Not on file  Occupational History   Not on file  Tobacco Use   Smoking status: Former    Current packs/day: 0.00    Types: Cigarettes    Quit date: 12/25/2003    Years since quitting: 19.9   Smokeless tobacco: Never  Vaping Use   Vaping status: Never Used  Substance and Sexual Activity   Alcohol use: No   Drug use: No   Sexual activity: Not Currently  Other Topics Concern   Not on file  Social History Narrative   Not on file   Social Drivers of Health   Financial Resource Strain: Not on file  Food Insecurity: Not on file  Transportation Needs: Not on file  Physical Activity: Not on file  Stress: Not on file  Social Connections: Unknown (05/09/2022)   Received from [facility], [facility]   Social Network    Social Network: Not on file  Intimate Partner Violence: Unknown (03/31/2022)   Received from [facility], [facility]   HITS    Physically Hurt: Not on file    Insult or Talk Down To: Not on file    Threaten Physical Harm: Not on file    Scream or Curse: Not on file    FAMILY HISTORY: Family History  Problem Relation Age of Onset   Cirrhosis Father    Breast cancer Paternal Aunt 15       BRCA1 p.E908 pos   Heart disease Maternal Grandfather        Heart attack   Breast cancer Paternal Aunt        dx in her 30s   Colon cancer Maternal Aunt        dx in her 30s   Lupus Sister        pat  1/2 sister   Other Brother 16       pat 1/2 brother died in MVA   Lung cancer Maternal Uncle        smoker   Other Paternal Aunt        died in a MVA   Other Paternal Aunt        died from poisioning      PHYSICAL EXAMINATION Patient sounds well.  In no apparent distress.  Mood and behavior are normal.     ASSESSMENT and THERAPY PLAN:   Malignant neoplasm of upper-inner quadrant of right breast in female, estrogen receptor negative (HCC) Raynelle Fanning is a 37 year old woman  with history of stage IIb triple negative breast cancer diagnosed in 2020 status post neoadjuvant chemotherapy followed by bilateral mastectomies.  Darely has a longstanding history of pain since completing her neoadjuvant chemotherapy throughout the past several years.  She has undergone many tests both through imaging and labs that have not indicated an etiology of her pain.  A rheumatology referral is pending.  Chronic Pain   Severe, ongoing pain in the right shoulder blade, middle of the spine, and both knees. She suspects pinched nerve. Pain is severe enough to disrupt sleep and daily activities.   -Imaging does not reveal cancer causing her pain. -Refer to sports medicine for further assessment and management.   -Prescribe prescription strength nonsteroid anti-inflammatory medication, to be taken with food--Naproxen 500mg  PO BID #60 prescribed. -Check in with patient in the morning to assess effectiveness of medication.    Rheumatology Concerns   Appointment already scheduled for April.   -If sports medicine doctor deems it necessary, they may be able to expedite rheumatology appointment.    Follow-up   Patient to schedule appointment with sports medicine. Monitor effectiveness of new medication and adjust as necessary.  RTC based on above results.     Follow up instructions:    -See sports medicine    The patient was provided an opportunity to ask questions and all were answered. The patient agreed with  the plan and demonstrated an understanding of the instructions.   The patient was advised to call back or seek an in-person evaluation if the symptoms worsen or if the condition fails to improve as anticipated.   I provided 15 minutes of non face-to-face telephone visit time during this encounter, and > 50% was spent counseling as documented under my assessment & plan.  Lillard Anes, NP 12/10/23 3:10 PM Medical Oncology and Hematology [facility] [address] [place], [place] [address] Tel. [phone]    Fax. [phone]

## 2023-12-10 NOTE — Assessment & Plan Note (Signed)
[name] is a 37 year old woman with history of stage IIb triple negative breast cancer diagnosed in 2020 status post neoadjuvant chemotherapy followed by bilateral mastectomies.  [name] has a longstanding history of pain since completing her neoadjuvant chemotherapy throughout the past several years.  She has undergone many tests both through imaging and labs that have not indicated an etiology of her pain.  A rheumatology referral is pending.  Chronic Pain   Severe, ongoing pain in the right shoulder blade, middle of the spine, and both knees. She suspects pinched nerve. Pain is severe enough to disrupt sleep and daily activities.   -Imaging does not reveal cancer causing her pain. -Refer to sports medicine for further assessment and management.   -Prescribe prescription strength nonsteroid anti-inflammatory medication, to be taken with food--Naproxen 500mg  PO BID #60 prescribed. -Check in with patient in the morning to assess effectiveness of medication.    Rheumatology Concerns   Appointment already scheduled for April.   -If sports medicine doctor deems it necessary, they may be able to expedite rheumatology appointment.    Follow-up   Patient to schedule appointment with sports medicine. Monitor effectiveness of new medication and adjust as necessary.  RTC based on above results.

## 2023-12-13 ENCOUNTER — Ambulatory Visit: Payer: 59 | Admitting: Family Medicine

## 2023-12-13 VITALS — BP 112/78 | HR 93 | Ht 60.0 in | Wt 153.0 lb

## 2023-12-13 DIAGNOSIS — M5412 Radiculopathy, cervical region: Secondary | ICD-10-CM | POA: Diagnosis not present

## 2023-12-13 DIAGNOSIS — M2550 Pain in unspecified joint: Secondary | ICD-10-CM | POA: Diagnosis not present

## 2023-12-13 DIAGNOSIS — M25562 Pain in left knee: Secondary | ICD-10-CM | POA: Diagnosis not present

## 2023-12-13 DIAGNOSIS — G8929 Other chronic pain: Secondary | ICD-10-CM | POA: Diagnosis not present

## 2023-12-13 LAB — SEDIMENTATION RATE: Sed Rate: 10 mm/h (ref 0–20)

## 2023-12-13 MED ORDER — PREDNISONE 50 MG PO TABS: ORAL_TABLET | ORAL | 0 refills | Status: DC

## 2023-12-13 MED ORDER — GABAPENTIN 100 MG PO CAPS: 100.0000 mg | ORAL_CAPSULE | Freq: Three times a day (TID) | ORAL | 3 refills | Status: DC | PRN

## 2023-12-13 NOTE — Progress Notes (Signed)
Initial labs back sedimentation rate is normal.  Other labs are still pending.

## 2023-12-13 NOTE — Patient Instructions (Addendum)
Thank you for coming in today.   I've referred you to Physical Therapy.  Let us know if you don't hear from them in one week.   Please get labs today before you leave   I've sent a prescription for Gabapentin and Prednisone to your pharmacy.   Check back in 6 weeks
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
## 2023-12-16 LAB — ANTI-NUCLEAR AB-TITER (ANA TITER): ANA Titer 1: 1:80 {titer} — ABNORMAL HIGH

## 2023-12-16 LAB — ANA: Anti Nuclear Antibody (ANA): POSITIVE — AB

## 2023-12-16 LAB — RHEUMATOID FACTOR: Rheumatoid fact SerPl-aCnc: <10 [IU]/mL (ref <14)

## 2023-12-16 LAB — CYCLIC CITRUL PEPTIDE ANTIBODY, IGG: Cyclic Citrullin Peptide Ab: <16 U

## 2023-12-16 LAB — HLA-B27 ANTIGEN: HLA-B27 Antigen: NEGATIVE

## 2023-12-28 ENCOUNTER — Telehealth: Payer: Self-pay | Admitting: Family Medicine

## 2023-12-28 NOTE — Telephone Encounter (Signed)
 Pt looking for Dr. Zollie Pee comments on all the labs she had done in December, only sees the note he added for SED rate. OK to respond via MyChart.

## 2023-12-30 ENCOUNTER — Other Ambulatory Visit: Payer: 59

## 2023-12-31 ENCOUNTER — Other Ambulatory Visit: Payer: Self-pay

## 2023-12-31 ENCOUNTER — Encounter: Payer: Self-pay | Admitting: Family Medicine

## 2023-12-31 DIAGNOSIS — M2550 Pain in unspecified joint: Secondary | ICD-10-CM

## 2023-12-31 NOTE — Telephone Encounter (Signed)
 Responded via result on the initial labs.

## 2024-01-04 ENCOUNTER — Other Ambulatory Visit: Payer: Self-pay | Admitting: Hematology and Oncology

## 2024-01-24 ENCOUNTER — Ambulatory Visit: Payer: 59 | Admitting: Family Medicine

## 2024-02-12 ENCOUNTER — Other Ambulatory Visit: Payer: Self-pay

## 2024-02-12 DIAGNOSIS — M2550 Pain in unspecified joint: Secondary | ICD-10-CM

## 2024-04-04 ENCOUNTER — Encounter: Payer: Self-pay | Admitting: Internal Medicine

## 2024-04-04 ENCOUNTER — Ambulatory Visit: Payer: 59 | Attending: Internal Medicine | Admitting: Internal Medicine

## 2024-04-04 VITALS — BP 117/81 | HR 78 | Resp 14 | Ht 61.0 in | Wt 153.0 lb

## 2024-04-04 DIAGNOSIS — M25562 Pain in left knee: Secondary | ICD-10-CM

## 2024-04-04 DIAGNOSIS — I82890 Acute embolism and thrombosis of other specified veins: Secondary | ICD-10-CM | POA: Diagnosis not present

## 2024-04-04 DIAGNOSIS — R768 Other specified abnormal immunological findings in serum: Secondary | ICD-10-CM | POA: Diagnosis not present

## 2024-04-04 DIAGNOSIS — G622 Polyneuropathy due to other toxic agents: Secondary | ICD-10-CM | POA: Diagnosis not present

## 2024-04-04 DIAGNOSIS — G629 Polyneuropathy, unspecified: Secondary | ICD-10-CM | POA: Insufficient documentation

## 2024-04-04 DIAGNOSIS — M25462 Effusion, left knee: Secondary | ICD-10-CM

## 2024-04-04 DIAGNOSIS — R7989 Other specified abnormal findings of blood chemistry: Secondary | ICD-10-CM

## 2024-04-04 NOTE — Progress Notes (Signed)
 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 Patient states she has issues in her back, neck, and knees. She has pain in her limbs. Patient states she feels like she constantly has the flu with heaviness and body aches. Patient states she has been having diarrhea a lot. Patient states she can get a rash on her chest and possibly her face. Patient states she does have a family history of lupus. Patient states for her age she does not feel well. )   Discussed the use of AI scribe software for clinical note transcription with the patient, who gave verbal consent to proceed.  History of Present Illness   [name] is a 38 year old female who presents with joint and body pains and abnormal lab test results. She was referred by Dr. Denyse Amass for evaluation with positive ANA 1:80 titer.  She experiences joint and body pains in multiple areas, with severe pain in her knees that makes activities like climbing stairs extremely painful, often bringing her to tears. Her knees swell, and imaging has shown fluid in the knees, though the cause is unclear. Back pain was investigated with an MRI, revealing disc issues. She has a history of breast cancer and underwent chemotherapy, after which her symptoms reportedly worsened. She experiences 'flu-like' symptoms daily and has been evaluated by multiple doctors, including a primary care physician and a sports medicine specialist, who suggested the possibility of mild lupus based on her lab results.  She experiences gastrointestinal symptoms, including bouts of extreme diarrhea over the past six months, which she describes as flare-ups. She does not have a gallbladder, and these symptoms  are new and not related to her previous gallbladder issues. She also reports swelling in her arms, particularly when engaging in activities like volleyball, and has noticed a lump near her sternum, which was attributed to inflammation.  She has skin sensitivity to the sun, particularly on her chest, and has noticed spots on her face, which she initially worried were related to lupus but now believes are sun spots or melasma. She uses SPF for skin protection. She also reports an itchy scalp with occasional tender lumps that resolve spontaneously. No significant rashes or hair loss.  She experiences occasional neuropathy in her feet, described as stinging in her toes. She has a history of blood clots, for which she was on Xarelto. She takes venlafaxine, initially prescribed for menopausal symptoms following a hysterectomy, which she continues to take to manage anxiety and hot flashes. Stopping the medication abruptly causes significant withdrawal symptoms.  She has a family history of autoimmune diseases, including a sister with lupus and breast cancer, an aunt with multiple sclerosis, and a grandmother with rheumatoid arthritis.      Labs reviewed 11/2023 ANA 1:80 speckled RF neg CCP neg ESR 10 HLA-B27 neg   Imaging Reviewed 11/09/23 Xray Left Knee FINDINGS: No acute fracture, dislocation or subluxation. No osteolytic or osteoblastic changes. Early degenerative joint disease medially with small osteophytes. Moderate suprapatellar effusion. IMPRESSION: Degenerative changes. Joint effusion.  11/30/23 MR Cervical Spine IMPRESSION: 1. No evidence for  metastatic disease to the cervical spine. 2. Moderate right and mild left foraminal stenosis at C4-5. 3. Moderate to severe right and moderate left foraminal stenosis at C5-6. 4. Mild foraminal narrowing bilaterally at C6-7 is worse right than left.  01/17/23 MRI Liver IMPRESSION: 1. The lesion of concern in the dome of the liver on the  prior exam appears to correspond to a focus of fatty sparing, surrounded by otherwise geographic hepatic steatosis. This is considered benign. No worrisome liver lesion warranting follow up is identified. 2. Geographic hepatic steatosis with relative sparing of segments 5, 6, and 7 of the liver. 3. Cholecystectomy.  Activities of Daily Living:  Patient reports morning stiffness for 0 minute.   Patient Reports nocturnal pain.  Difficulty dressing/grooming: Denies Difficulty climbing stairs: Reports Difficulty getting out of chair: Denies Difficulty using hands for taps, buttons, cutlery, and/or writing: Denies  Review of Systems  Constitutional:  Positive for fatigue.  HENT:  Positive for mouth dryness. Negative for mouth sores.   Eyes:  Negative for dryness.  Respiratory:  Negative for shortness of breath.   Cardiovascular:  Positive for palpitations. Negative for chest pain.  Gastrointestinal:  Positive for constipation and diarrhea. Negative for blood in stool.  Endocrine: Negative for increased urination.  Genitourinary:  Negative for involuntary urination.  Musculoskeletal:  Positive for joint pain, gait problem, joint pain, joint swelling, myalgias and myalgias. Negative for muscle weakness, morning stiffness and muscle tenderness.  Skin:  Positive for color change, rash and sensitivity to sunlight. Negative for hair loss.  Allergic/Immunologic: Negative for susceptible to infections.  Neurological:  Positive for dizziness and headaches.  Hematological:  Negative for swollen glands.  Psychiatric/Behavioral:  Positive for sleep disturbance. Negative for depressed mood. The patient is not nervous/anxious.     PMFS History:  Patient Active Problem List   Diagnosis Date Noted   Positive ANA (antinuclear antibody) 04/04/2024   Pain and swelling of left knee 04/04/2024   Peripheral neuropathy 04/04/2024   Abnormal liver function test 04/04/2024   Superficial vein thrombosis  09/11/2022   Vasomotor symptoms due to menopause 08/22/2019   BRCA1 gene mutation positive 02/20/2018   Genetic testing 02/20/2018   Family history of breast cancer    Family history of colon cancer    Family history of stomach cancer    Malignant neoplasm of upper-inner quadrant of right breast in female, estrogen receptor negative (HCC)    Acid reflux 01/18/2018   Diabetes mellitus arising in pregnancy 01/18/2018   H/O tubal ligation 01/18/2018   History of palpitations 01/18/2018   Umbilical swelling 01/18/2018   Hypokalemia 10/16/2017   Cervicalgia 11/22/2016   Low back pain 11/22/2016   Pain in thoracic spine 11/22/2016   Hand, foot and mouth disease 11/08/2015   Incisional hernia, without obstruction or gangrene 09/03/2015   Acute serous otitis media 07/11/2015   Abdominal pain of other specified site 04/01/2015   Umbilical hernia without obstruction and without gangrene 01/05/2014   Postpartum care following cesarean delivery (11/26) 11/19/2013   Acute sinusitis 09/15/2013   Irregular heart rate 08/20/2013   Heart palpitations 08/20/2013   Mastodynia 12/09/2012   Abdominal pain, epigastric 03/29/2012   Nausea without vomiting 03/29/2012   Intractable migraine 01/10/2012   Lumbosacral ligament sprain 01/10/2012   Thoracic back sprain 01/10/2012   Influenza with respiratory manifestation 12/20/2011   Dysuria 10/11/2011   Lumbar sprain 10/11/2011   Benign paroxysmal positional vertigo 06/01/2011   Other malaise and fatigue 06/01/2011    Past  Medical History:  Diagnosis Date   breast ca dx'd 01/2018   right   Cluster headaches    Complication of anesthesia    severe hypotension to the point of syncope   Family history of breast cancer    Family history of colon cancer    Family history of stomach cancer    History of breast cancer 01/2018   right   History of chemotherapy    finished 07/05/2018   History of palpitations    due to low K+   Hyperthyroidism    no  current med.   Infection of left breast 11/11/2018   to start antibiotic 11/11/2018   Irritable bowel syndrome (IBS)    no current med.   Lupus (systemic lupus erythematosus) (HCC)     Family History  Problem Relation Age of Onset   Cirrhosis Father    Breast cancer Paternal Aunt 45       BRCA1 [id number] pos   Heart disease Maternal Grandfather        Heart attack   Breast cancer Paternal Aunt        dx in her 30s   Colon cancer Maternal Aunt        dx in her 30s   Lupus Sister        pat 1/2 sister   Other Brother 24       pat 1/2 brother died in MVA   Lung cancer Maternal Uncle        smoker   Other Paternal Aunt        died in a MVA   Other Paternal Aunt        died from poisioning   Past Surgical History:  Procedure Laterality Date   BREAST RECONSTRUCTION WITH PLACEMENT OF TISSUE EXPANDER AND ALLODERM Bilateral 07/29/2018   Procedure: BILATERAL BREAST RECONSTRUCTION WITH PLACEMENT OF TISSUE EXPANDER AND ALLODERM;  Surgeon: Glenna Fellows, MD;  Location: [facility];  Service: Plastics;  Laterality: Bilateral;   CESAREAN SECTION     CESAREAN SECTION WITH BILATERAL TUBAL LIGATION Bilateral 11/19/2013   Procedure: REPEAT CESAREAN SECTION WITH BILATERAL TUBAL LIGATION; TWINS;  Surgeon: Lenoard Aden, MD;  Location: [facility];  Service: Obstetrics;  Laterality: Bilateral;  EDD: 12/09/13   CHOLECYSTECTOMY     PORTA CATH REMOVAL Right 11/19/2018   Procedure: PORTA CATH REMOVAL;  Surgeon: Glenna Fellows, MD;  Location: [facility];  Service: Plastics;  Laterality: Right;   PORTACATH PLACEMENT N/A 02/20/2018   Procedure: INSERTION PORT-A-CATH WITH ULTRASOUND;  Surgeon: Emelia Loron, MD;  Location: [facility];  Service: General;  Laterality: N/A;   REMOVAL OF BILATERAL TISSUE EXPANDERS WITH PLACEMENT OF BILATERAL BREAST IMPLANTS Bilateral 11/19/2018   Procedure: REMOVAL OF BILATERAL TISSUE EXPANDERS WITH PLACEMENT OF BILATERAL  BREAST IMPLANTS;  Surgeon: Glenna Fellows, MD;  Location: [facility];  Service: Plastics;  Laterality: Bilateral;   ROBOTIC ASSISTED TOTAL HYSTERECTOMY WITH BILATERAL SALPINGO OOPHERECTOMY Bilateral 07/22/2019   Procedure: XI ROBOTIC ASSISTED TOTAL HYSTERECTOMY WITH BILATERAL SALPINGO OOPHORECTOMY WITH UMBILICAL HERNIA REPAIR;  Surgeon: Adolphus Birchwood, MD;  Location: [facility];  Service: Gynecology;  Laterality: Bilateral;   Social History   Social History Narrative   Not on file   Immunization History  Administered Date(s) Administered   Tdap 11/20/2013     Objective: Vital Signs: BP 117/81 (BP Location: Left Arm, Patient Position: Sitting, Cuff Size: Normal) Comment (BP Location): per patient, only left arm can be done.  Pulse 78   Resp 14   Ht 5\' 1"  (1.549 m)   Wt 153 lb (69.4 kg)   LMP 07/22/2019   BMI 28.91 kg/m    Physical Exam HENT:     Mouth/Throat:     Mouth: Mucous membranes are moist.     Pharynx: Oropharynx is clear.  Eyes:     Conjunctiva/sclera: Conjunctivae normal.  Cardiovascular:     Rate and Rhythm: Normal rate and regular rhythm.  Pulmonary:     Effort: Pulmonary effort is normal.     Breath sounds: Normal breath sounds.  Lymphadenopathy:     Cervical: No cervical adenopathy.  Skin:    General: Skin is warm and dry.     Comments: Small well demarcated hyperpigmented patches on both cheeks No digital pitting or nail changes Callus on soles  Neurological:     Mental Status: She is alert.  Psychiatric:        Mood and Affect: Mood normal.      Musculoskeletal Exam:  Shoulders full ROM no tenderness or swelling Elbows full ROM no tenderness or swelling Wrists full ROM no tenderness or swelling Fingers full ROM no tenderness or swelling Mild tenderness to pressure at paraspinal muscles on neck and upper back Knees full ROM no tenderness or swelling Ankles full ROM no tenderness or swelling Mild 1st toe and on left 5th toe  bunions   Investigation: No additional findings.  Imaging: No results found.  Recent Labs: Lab Results  Component Value Date   WBC 6.4 12/20/2022   HGB 14.9 12/20/2022   PLT 304 12/20/2022   NA 138 12/20/2022   K 3.8 12/20/2022   CL 100 12/20/2022   CO2 31 12/20/2022   GLUCOSE 111 (H) 12/20/2022   BUN 8 12/20/2022   CREATININE 0.72 12/20/2022   BILITOT 0.5 12/20/2022   ALKPHOS 107 12/20/2022   AST 24 12/20/2022   ALT 33 12/20/2022   PROT 8.6 (H) 12/20/2022   ALBUMIN 5.4 (H) 12/20/2022   CALCIUM 9.8 12/20/2022   GFRAA >60 07/22/2020    Speciality Comments: No specialty comments available.  Procedures:  No procedures performed Allergies: Lidocaine   Assessment / Plan:     Visit Diagnoses: Positive ANA (antinuclear antibody) - Plan: Sedimentation rate, RNP Antibody, Anti-Smith antibody, Sjogrens syndrome-A extractable nuclear antibody, Sjogrens syndrome-B extractable nuclear antibody, Anti-DNA antibody, double-stranded, C3 and C4, Protein / creatinine ratio, urine, IgG, IgA, IgM Positive ANA test with a titer of 1:80. Symptoms and family history suggest possible lupus, but without specific clinical criteria. Chronic joint pain and swelling, particularly in the knees, with a history of fluid accumulation for two years. Differential includes autoimmune conditions such as lupus, seronegative rheumatoid arthritis, or undifferentiated connective tissue disease. Imaging and previous evaluations have not identified structural abnormalities. Family history of autoimmune diseases noted. - Order ANA panel and specific antibody tests for autoimmune conditions. - Check markers of inflammation: sedimentation rate, C-reactive protein, complement C3, C4, and immunoglobulins. - Consider joint aspiration for analysis if significant swelling occurs.  Abnormal liver function test - Plan: Anti-smooth muscle antibody, IgG, Mitochondrial antibodies Gastrointestinal symptoms Intermittent severe  diarrhea episodes over six months, unrelated to cholecystectomy. Symptoms described as flare-ups, similar to other systemic symptoms. History sounds possible IBS but unclear for this to start recently. Also with some abnormal liver imaging and LFTs will also check ASMA and AMA Abs today. If abnormal recommend GI consultation.  Neuropathy Intermittent neuropathy symptoms in the feet, including tingling and stinging sensations.  2/2 Previous chemotherapy. Symptoms are not severe but present intermittently.  Blood clots Superficial venous thrombosis one year ago, with no clear etiology. Currently on Xarelto. Occasional sensations similar to previous clot symptoms, possibly related to hypokalemia.  Menopausal symptoms Surgical menopause with vasomotor symptoms. Managed with venlafaxine, providing partial relief. Attempts to discontinue previous resulted in withdrawal symptoms but was a while ago and has been tolerating well.   Orders: Orders Placed This Encounter  Procedures   Sedimentation rate   RNP Antibody   Anti-Smith antibody   Sjogrens syndrome-A extractable nuclear antibody   Sjogrens syndrome-B extractable nuclear antibody   Anti-DNA antibody, double-stranded   C3 and C4   Protein / creatinine ratio, urine   IgG, IgA, IgM   Anti-smooth muscle antibody, IgG   Mitochondrial antibodies   No orders of the defined types were placed in this encounter.    Follow-Up Instructions: No follow-ups on file.   Fuller Plan, MD  Note - This record has been created using [employer].  Chart creation errors have been sought, but may not always  have been located. Such creation errors do not reflect on  the standard of medical care.

## 2024-04-07 LAB — PROTEIN / CREATININE RATIO, URINE
Creatinine, Urine: 121 mg/dL (ref 20–275)
Protein/Creat Ratio: 58 mg/g{creat} (ref 24–184)
Protein/Creatinine Ratio: 0.058 mg/mg{creat} (ref 0.024–0.184)
Total Protein, Urine: 7 mg/dL (ref 5–24)

## 2024-04-07 LAB — IGG, IGA, IGM
IgG (Immunoglobin G), Serum: 873 mg/dL (ref 600–1640)
IgM, Serum: 83 mg/dL (ref 50–300)
Immunoglobulin A: 241 mg/dL (ref 47–310)

## 2024-04-07 LAB — SJOGRENS SYNDROME-B EXTRACTABLE NUCLEAR ANTIBODY: SSB (La) (ENA) Antibody, IgG: <1 AI

## 2024-04-07 LAB — MITOCHONDRIAL ANTIBODIES: Mitochondrial M2 Ab, IgG: <=20 U (ref <=20.0)

## 2024-04-07 LAB — C3 AND C4
C3 Complement: 150 mg/dL (ref 83–193)
C4 Complement: 28 mg/dL (ref 15–57)

## 2024-04-07 LAB — ANTI-SMITH ANTIBODY: ENA SM Ab Ser-aCnc: <1 AI

## 2024-04-07 LAB — ANTI-DNA ANTIBODY, DOUBLE-STRANDED: ds DNA Ab: <1 [IU]/mL

## 2024-04-07 LAB — SEDIMENTATION RATE: Sed Rate: 6 mm/h (ref 0–20)

## 2024-04-07 LAB — ANTI-SMOOTH MUSCLE ANTIBODY, IGG: Actin (Smooth Muscle) Antibody (IGG): <20 U (ref <20)

## 2024-04-07 LAB — RNP ANTIBODY: Ribonucleic Protein(ENA) Antibody, IgG: <1 AI

## 2024-04-07 LAB — SJOGRENS SYNDROME-A EXTRACTABLE NUCLEAR ANTIBODY: SSA (Ro) (ENA) Antibody, IgG: <1 AI

## 2024-06-19 ENCOUNTER — Encounter: Payer: Self-pay | Admitting: Genetic Counselor

## 2024-07-04 ENCOUNTER — Encounter: Payer: Self-pay | Admitting: Internal Medicine

## 2024-08-27 ENCOUNTER — Telehealth: Payer: Self-pay | Admitting: *Deleted

## 2024-08-27 NOTE — Telephone Encounter (Signed)
 Received call from pt requesting advice from MD if okay to proceed with GLP-1 injections for weight loss.  Per MD, okay to proceed.  Pt educated and verbalized understanding.

## 2024-10-17 ENCOUNTER — Other Ambulatory Visit: Payer: Self-pay | Admitting: *Deleted

## 2024-10-17 DIAGNOSIS — C50211 Malignant neoplasm of upper-inner quadrant of right female breast: Secondary | ICD-10-CM

## 2024-10-20 ENCOUNTER — Inpatient Hospital Stay: Payer: 59 | Attending: Hematology and Oncology | Admitting: Hematology and Oncology

## 2024-10-20 ENCOUNTER — Inpatient Hospital Stay: Payer: 59

## 2024-10-20 ENCOUNTER — Encounter: Payer: Self-pay | Admitting: Hematology and Oncology

## 2024-10-20 VITALS — BP 102/80 | HR 81 | Temp 98.1°F | Resp 18 | Ht 61.0 in | Wt 151.8 lb

## 2024-10-20 DIAGNOSIS — M542 Cervicalgia: Secondary | ICD-10-CM | POA: Insufficient documentation

## 2024-10-20 DIAGNOSIS — E663 Overweight: Secondary | ICD-10-CM | POA: Diagnosis not present

## 2024-10-20 DIAGNOSIS — Z79899 Other long term (current) drug therapy: Secondary | ICD-10-CM | POA: Insufficient documentation

## 2024-10-20 DIAGNOSIS — M79659 Pain in unspecified thigh: Secondary | ICD-10-CM | POA: Insufficient documentation

## 2024-10-20 DIAGNOSIS — G8929 Other chronic pain: Secondary | ICD-10-CM | POA: Insufficient documentation

## 2024-10-20 DIAGNOSIS — Z86718 Personal history of other venous thrombosis and embolism: Secondary | ICD-10-CM | POA: Diagnosis not present

## 2024-10-20 DIAGNOSIS — Z853 Personal history of malignant neoplasm of breast: Secondary | ICD-10-CM | POA: Diagnosis present

## 2024-10-20 DIAGNOSIS — M549 Dorsalgia, unspecified: Secondary | ICD-10-CM | POA: Insufficient documentation

## 2024-10-20 DIAGNOSIS — K7689 Other specified diseases of liver: Secondary | ICD-10-CM | POA: Diagnosis not present

## 2024-10-20 DIAGNOSIS — C50211 Malignant neoplasm of upper-inner quadrant of right female breast: Secondary | ICD-10-CM | POA: Diagnosis not present

## 2024-10-20 DIAGNOSIS — Z08 Encounter for follow-up examination after completed treatment for malignant neoplasm: Secondary | ICD-10-CM | POA: Diagnosis present

## 2024-10-20 DIAGNOSIS — Z171 Estrogen receptor negative status [ER-]: Secondary | ICD-10-CM

## 2024-10-20 LAB — CBC WITH DIFFERENTIAL (CANCER CENTER ONLY)
Abs Immature Granulocytes: 0.01 K/uL (ref 0.00–0.07)
Basophils Absolute: 0 K/uL (ref 0.0–0.1)
Basophils Relative: 1 %
Eosinophils Absolute: 0.2 K/uL (ref 0.0–0.5)
Eosinophils Relative: 3 %
HCT: 40.5 % (ref 36.0–46.0)
Hemoglobin: 14 g/dL (ref 12.0–15.0)
Immature Granulocytes: 0 %
Lymphocytes Relative: 33 %
Lymphs Abs: 2.2 K/uL (ref 0.7–4.0)
MCH: 29.4 pg (ref 26.0–34.0)
MCHC: 34.6 g/dL (ref 30.0–36.0)
MCV: 85.1 fL (ref 80.0–100.0)
Monocytes Absolute: 0.4 K/uL (ref 0.1–1.0)
Monocytes Relative: 6 %
Neutro Abs: 3.9 K/uL (ref 1.7–7.7)
Neutrophils Relative %: 57 %
Platelet Count: 304 K/uL (ref 150–400)
RBC: 4.76 MIL/uL (ref 3.87–5.11)
RDW: 11.8 % (ref 11.5–15.5)
WBC Count: 6.6 K/uL (ref 4.0–10.5)
nRBC: 0 % (ref 0.0–0.2)

## 2024-10-20 LAB — CMP (CANCER CENTER ONLY)
ALT: 24 U/L (ref 0–44)
AST: 20 U/L (ref 15–41)
Albumin: 4.8 g/dL (ref 3.5–5.0)
Alkaline Phosphatase: 81 U/L (ref 38–126)
Anion gap: 6 (ref 5–15)
BUN: 10 mg/dL (ref 6–20)
CO2: 31 mmol/L (ref 22–32)
Calcium: 10.1 mg/dL (ref 8.9–10.3)
Chloride: 101 mmol/L (ref 98–111)
Creatinine: 0.76 mg/dL (ref 0.44–1.00)
GFR, Estimated: >60 mL/min (ref >60)
Glucose, Bld: 89 mg/dL (ref 70–99)
Potassium: 4.1 mmol/L (ref 3.5–5.1)
Sodium: 138 mmol/L (ref 135–145)
Total Bilirubin: 0.3 mg/dL (ref 0.0–1.2)
Total Protein: 7.6 g/dL (ref 6.5–8.1)

## 2024-10-20 NOTE — Progress Notes (Signed)
 Patient Care Team: Sasser, Deward ORN, MD as PCP - General (Family Medicine) Arelia Filippo, MD as Consulting Physician (Plastic Surgery) Crawford, Morna Pickle, NP as Nurse Practitioner (Hematology and Oncology) Odean Potts, MD as Consulting Physician (Hematology and Oncology) Ebbie Cough, MD as Consulting Physician (General Surgery)  DIAGNOSIS:  Encounter Diagnosis  Name Primary?   Malignant neoplasm of upper-inner quadrant of right breast in female, estrogen receptor negative (HCC) Yes    SUMMARY OF ONCOLOGIC HISTORY: Oncology History  Malignant neoplasm of upper-inner quadrant of right breast in female, estrogen receptor negative (HCC)  02/01/2018 Initial Diagnosis   Screening detected right breast mass in the upper inner quadrant measuring 1.5 cm, with additional enhancement it measured 2.2 cm.  No axillary lymph nodes.  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  The Hereditary Gene Panel offered by [facility] includes sequencing and/or deletion duplication testing of the following 47 genes: APC, ATM, AXIN2, BARD1, BMPR1A, BRCA1, BRCA2, BRIP1, CDH1, CDK4, CDKN2A (p14ARF), CDKN2A (p16INK4a), CHEK2, CTNNA1, DICER1, EPCAM (Deletion/duplication testing only), GREM1 (promoter region deletion/duplication testing only), KIT, MEN1, MLH1, MSH2, MSH3, MSH6, MUTYH, NBN, NF1, NHTL1, PALB2, PDGFRA, PMS2, POLD1, POLE, PTEN, RAD50, RAD51C, RAD51D, SDHB, SDHC, SDHD, SMAD4, SMARCA4. STK11, TP53, TSC1, TSC2, and VHL.  The following genes were evaluated for sequence changes only: SDHA and HOXB13 c.251G>A variant only. The report date is February 20, 2018.  UPDATE: MLH1 c.979C>G VUS has been amended to benign.  The amended report date is June 19, 2024.   02/20/2018 - 07/05/2018  Neo-Adjuvant Chemotherapy   Neoadjuvant dose dense Adriamycin  and Cytoxan  x4 followed by Taxol  and carboplatin  (received 8 cycles of Taxol  and 3 cycles of carboplatin )   07/29/2018 Surgery   Bilateral mastectomies: Right mastectomy benign, 0/6 lymph nodes; left mastectomy: Benign, pathologic complete response   07/22/2019 Surgery   Total hysterectomy and bilateral salpingoophorectomy     CHIEF COMPLIANT: Surveillance of breast cancer  HISTORY OF PRESENT ILLNESS:  History of Present Illness [name] is a 38 year old female who presents with back and hip pain.  She has a history of cancer and is concerned about a 1% risk for pancreatic cancer. She has undergone numerous tests, including MRIs, with no concerning findings reported. She expresses frustration with the lack of communication regarding her test results.  She has experienced weight fluctuations and finds it challenging to maintain a healthy diet due to her upbringing. She has a history of hysterectomy and chemotherapy, which she feels has aged her body. She has been using vitamin D and turmeric to manage inflammation.  She has undergone extensive testing with a rheumatologist due to concerns about lupus, given her sister's diagnosis of lupus and cancer. Initial tests showed a positive ANA, but further testing for lupus was negative.     ALLERGIES:  is allergic to lidocaine .  MEDICATIONS:  Current Outpatient Medications  Medication Sig Dispense Refill   Cyanocobalamin (VITAMIN B-12 PO) Take by mouth.     Multiple Vitamin (MULTIVITAMIN PO) Take by mouth.     venlafaxine  (EFFEXOR ) 75 MG tablet Take 1 tablet by mouth once daily 90 tablet 3   IBUPROFEN  PO Take by mouth. (Patient not taking: Reported on 10/20/2024)     No current facility-administered medications for this visit.  PHYSICAL EXAMINATION: ECOG PERFORMANCE STATUS: 1 - Symptomatic but completely ambulatory  Vitals:   10/20/24 1410  BP: 102/80  Pulse:  81  Resp: 18  Temp: 98.1 F (36.7 C)  SpO2: 98%   Filed Weights   10/20/24 1410  Weight: 151 lb 12.8 oz (68.9 kg)    Physical Exam   (exam performed in the presence of a chaperone)  LABORATORY DATA:  I have reviewed the data as listed    Latest Ref Rng & Units 12/20/2022    2:12 PM 05/31/2022    3:38 PM 09/01/2021   10:39 AM  CMP  Glucose 70 - 99 mg/dL 888  92  76   BUN 6 - 20 mg/dL 8  8  9    Creatinine 0.44 - 1.00 mg/dL 9.27  9.35  9.27   Sodium 135 - 145 mmol/L 138  141  142   Potassium 3.5 - 5.1 mmol/L 3.8  3.9  4.2   Chloride 98 - 111 mmol/L 100  103  105   CO2 22 - 32 mmol/L 31  30  29    Calcium 8.9 - 10.3 mg/dL 9.8  89.6  89.8   Total Protein 6.5 - 8.1 g/dL 8.6  8.3  7.3   Total Bilirubin 0.3 - 1.2 mg/dL 0.5  0.5  0.4   Alkaline Phos 38 - 126 U/L 107  97  92   AST 15 - 41 U/L 24  24  17    ALT 0 - 44 U/L 33  32  22     Lab Results  Component Value Date   WBC 6.6 10/20/2024   HGB 14.0 10/20/2024   HCT 40.5 10/20/2024   MCV 85.1 10/20/2024   PLT 304 10/20/2024   NEUTROABS 3.9 10/20/2024    ASSESSMENT & PLAN:  Malignant neoplasm of upper-inner quadrant of right breast in female, estrogen receptor negative (HCC) 02/01/2018:Screening detected right breast mass in the upper inner quadrant measuring 1.5 cm, with additional enhancement it measured 2.2 cm.  No axillary lymph nodes.  Biopsy revealed IDC with DCIS, grade 3, ER 0%, PR 0%, HER-2 negative, Ki-67 80%, T1CN0 stage Ib clinical stage   Recommendation: 1.  Neoadjuvant chemotherapy with dose dense Adriamycin  and Cytoxan  every 2 weeks x4 followed by Taxol  weekly x12 (changed to Abraxane  due to rash)  accompanied by carboplatin  every 3 weeks x4 completed 07/05/2018 3.  Genetics BRCA1 mutation and MLH1 4.  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   Patient decided not to take adjuvant olaparib.  02/10/2022: CT CAP at [facility]: Mild hepatic steatosis with subcentimeter liver lesions less well deviated on the current exam Liver MRI 02/23/2022: No change in the multifocal T2 hyperintense liver lesions both sides of the liver compared to 09/01/2021 favored to be multiple cysts.  Recommended a 2-month follow-up Liver MRI 06/09/2022: Stable, 57-month follow-up recommended   Superficial vein thrombosis Completed 3 months of Xarelto  Ultrasound left leg 09/05/2022: Negative for blood clot.   Lupus anticoagulant testing: Negative  Cancer surveillance: Since she had bilateral mastectomies there is no role of her breast imaging. Patient follows with GYN for ovarian cancer surveillance. GI follow-up needed for pancreatic cancer surveillance. Recommend guardant reveal for MRD testing  Cervical spine MRI 12/07/2023: No evidence of metastatic disease.  Foraminal stenosis Diffuse body aches and pains: She went through extensive rheumatology workup which was negative apparently. Recommended vitamin D,  turmeric to decrease inflammation Suggested GLP-1 agonist for losing weight  I did not recommend routine imaging studies unless guardant reveal detects positive test result. ------------------------------------- Assessment and Plan Assessment & Plan Malignant neoplasm of upper-inner quadrant of right breast, status post treatment, under surveillance Currently under surveillance. Gardant testing discussed for its high accuracy in detecting breast cancer without tissue samples. - Order Gardant test every six months for cancer surveillance. - Arrange for Gardant test to be conducted at home. - If Gardant test is positive, plan for full body scan to investigate further.  Chronic back  pain with associated neck, hip, and thigh pain Pain likely due to inflammation and lack of estrogen post-hysterectomy and chemotherapy. Estrogen's role in metabolism and inflammation discussed. - Recommend vitamin D supplementation. - Recommend turmeric supplementation to reduce inflammation.  Overweight Considered GLP-1 agonists for weight loss. Benefits and potential for weight regain discussed. She prefers lifestyle changes. - Consider GLP-1 agonists if lifestyle changes are insufficient for weight management.      No orders of the defined types were placed in this encounter.  The patient has a good understanding of the overall plan. she agrees with it. she will call with any problems that may develop before the next visit here.  I personally spent a total of 30 minutes in the care of the patient today including preparing to see the patient, getting/reviewing separately obtained history, performing a medically appropriate exam/evaluation, counseling and educating, placing orders, referring and communicating with other health care professionals, documenting clinical information in the EHR, independently interpreting results, communicating results, and coordinating care.   Viinay K Nelsy Madonna, MD 10/20/24

## 2024-10-20 NOTE — Assessment & Plan Note (Signed)
 02/01/2018:Screening detected right breast mass in the upper inner quadrant measuring 1.5 cm, with additional enhancement it measured 2.2 cm.  No axillary lymph nodes.  Biopsy revealed IDC with DCIS, grade 3, ER 0%, PR 0%, HER-2 negative, Ki-67 80%, T1CN0 stage Ib clinical stage   Recommendation: 1.  Neoadjuvant chemotherapy with dose dense Adriamycin  and Cytoxan  every 2 weeks x4 followed by Taxol  weekly x12 (changed to Abraxane  due to rash)  accompanied by carboplatin  every 3 weeks x4 completed 07/05/2018 3.  Genetics BRCA1 mutation and MLH1 4.  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   Patient decided not to take adjuvant olaparib.  02/10/2022: CT CAP at [facility]: Mild hepatic steatosis with subcentimeter liver lesions less well deviated on the current exam Liver MRI 02/23/2022: No change in the multifocal T2 hyperintense liver lesions both sides of the liver compared to 09/01/2021 favored to be multiple cysts.  Recommended a 54-month follow-up Liver MRI 06/09/2022: Stable, 106-month follow-up recommended   Superficial vein thrombosis Completed 3 months of Xarelto  Ultrasound left leg 09/05/2022: Negative for blood clot.   Lupus anticoagulant testing: Negative  Cancer surveillance: Since she had bilateral mastectomies there is no role of her breast imaging. Patient follows with GYN for ovarian cancer surveillance. GI follow-up needed for pancreatic cancer surveillance. Recommend guardant reveal for MRD testing  Cervical spine MRI 12/07/2023: No evidence of metastatic disease.  Foraminal stenosis

## 2024-10-30 ENCOUNTER — Telehealth: Payer: Self-pay

## 2024-10-30 NOTE — Telephone Encounter (Signed)
 Per md orders placed for Guardant Reveal through their portal.

## 2024-12-29 ENCOUNTER — Encounter: Payer: Self-pay | Admitting: Urology

## 2024-12-29 ENCOUNTER — Ambulatory Visit: Admitting: Urology

## 2024-12-29 VITALS — BP 115/77 | HR 76

## 2024-12-29 DIAGNOSIS — N200 Calculus of kidney: Secondary | ICD-10-CM | POA: Diagnosis not present

## 2024-12-29 DIAGNOSIS — R310 Gross hematuria: Secondary | ICD-10-CM

## 2024-12-29 DIAGNOSIS — N952 Postmenopausal atrophic vaginitis: Secondary | ICD-10-CM | POA: Diagnosis not present

## 2024-12-29 NOTE — Progress Notes (Signed)
 "  12/29/2024 1:58 PM   [name] 03/30/1986 [id number]  Referring provider: Dow Longs, PA-C [address] [place],  [place] [address]  No chief complaint on file.   HPI:  1) gross hematuria -she noted red urine and dark urine on 3 occasions in November and December 2025. No clots. Has dysuria on and off.  She was treated with cephalexin. She hasn't seen a stone pass. History of breast cancer treated with neoadjuvant chemo (Adriamycin , Cytoxan , followed by Taxol  weekly changed to Abraxane ) and bilateral mastectomies in 2019 with reconstruction and total hysterectomy and BSO in 2020.  Due to BRCA1 mutation and MLH1, she has about a 1% risk of pancreatic cancer.  CT in 2022 and 2023 benign with last at [facility] Feb 2023 - No adrenal nodule, 4 punctate nonobstructing stones in the right kidney, largest in the lower pole measuring 2 mm. No left renal calculi. No hydronephrosis or perinephric edema. No evidence of focal renal lesion on this unenhanced exam. Decompressed ureters without ureteral stone. Urinary bladder is completely empty and not well assessed. No bladder stone.  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       PMH: Past Medical History:  Diagnosis Date   breast ca dx'd 01/2018   right   Cluster headaches    Complication of anesthesia    severe hypotension to the point of syncope   Family history of breast cancer    Family history of colon cancer    Family history of stomach cancer    History of breast cancer 01/2018   right   History of chemotherapy    finished 07/05/2018   History of palpitations    due to low K+   Hyperthyroidism    no current med.   Infection of left breast 11/11/2018   to start antibiotic 11/11/2018   Irritable bowel syndrome (IBS)    no current med.   Lupus (systemic lupus erythematosus) (HCC)     Surgical History: Past Surgical History:  Procedure Laterality Date   BREAST RECONSTRUCTION WITH PLACEMENT OF TISSUE EXPANDER AND ALLODERM  Bilateral 07/29/2018   Procedure: BILATERAL BREAST RECONSTRUCTION WITH PLACEMENT OF TISSUE EXPANDER AND ALLODERM;  Surgeon: Arelia Filippo, MD;  Location: [facility];  Service: Plastics;  Laterality: Bilateral;   CESAREAN SECTION     CESAREAN SECTION WITH BILATERAL TUBAL LIGATION Bilateral 11/19/2013   Procedure: REPEAT CESAREAN SECTION WITH BILATERAL TUBAL LIGATION; TWINS;  Surgeon: Charlie JINNY Flowers, MD;  Location: [facility];  Service: Obstetrics;  Laterality: Bilateral;  EDD: 12/09/13   CHOLECYSTECTOMY     PORTA CATH REMOVAL Right 11/19/2018   Procedure: PORTA CATH REMOVAL;  Surgeon: Arelia Filippo, MD;  Location: [facility];  Service: Plastics;  Laterality: Right;   PORTACATH PLACEMENT N/A 02/20/2018   Procedure: INSERTION PORT-A-CATH WITH ULTRASOUND;  Surgeon: Ebbie Cough, MD;  Location: [facility];  Service: General;  Laterality: N/A;   REMOVAL OF BILATERAL TISSUE EXPANDERS WITH PLACEMENT OF BILATERAL BREAST IMPLANTS Bilateral 11/19/2018   Procedure: REMOVAL OF BILATERAL TISSUE EXPANDERS WITH PLACEMENT OF BILATERAL BREAST IMPLANTS;  Surgeon: Arelia Filippo, MD;  Location: [facility];  Service: Plastics;  Laterality: Bilateral;   ROBOTIC ASSISTED TOTAL HYSTERECTOMY WITH BILATERAL SALPINGO OOPHERECTOMY Bilateral 07/22/2019   Procedure: XI ROBOTIC ASSISTED TOTAL HYSTERECTOMY WITH BILATERAL SALPINGO OOPHORECTOMY WITH UMBILICAL HERNIA REPAIR;  Surgeon: Eloy Herring, MD;  Location: [facility];  Service: Gynecology;  Laterality: Bilateral;    Home Medications:  Allergies  as of 12/29/2024       Reactions   Lidocaine  Other (See Comments)   NUMBNESS AND TINGLING ARMS AND LEGS        Medication List        Accurate as of December 29, 2024  1:58 PM. If you have any questions, ask your nurse or doctor.          IBUPROFEN  PO Take by mouth.   MULTIVITAMIN PO Take by mouth.   venlafaxine  75 MG tablet Commonly known as:  EFFEXOR  Take 1 tablet by mouth once daily   VITAMIN B-12 PO Take by mouth.        Allergies: Allergies[1]  Family History: Family History  Problem Relation Age of Onset   Cirrhosis Father    Breast cancer Paternal Aunt 65       BRCA1 p.E908 pos   Heart disease Maternal Grandfather        Heart attack   Breast cancer Paternal Aunt        dx in her 30s   Colon cancer Maternal Aunt        dx in her 30s   Lupus Sister        pat 1/2 sister   Other Brother 16       pat 1/2 brother died in MVA   Lung cancer Maternal Uncle        smoker   Other Paternal Aunt        died in a MVA   Other Paternal Aunt        died from poisioning    Social History:  reports that she quit smoking about 21 years ago. She has never used smokeless tobacco. She reports that she does not drink alcohol and does not use drugs.   Physical Exam: LMP 07/22/2019   Constitutional:  Alert and oriented, No acute distress. HEENT: [place] AT, moist mucus membranes.  Trachea midline, no masses. Cardiovascular: No clubbing, cyanosis, or edema. Respiratory: Normal respiratory effort, no increased work of breathing. GI: Abdomen is soft, nontender, nondistended, no abdominal masses GU: No CVA tenderness Skin: No rashes, bruises or suspicious lesions. Neurologic: Grossly intact, no focal deficits, moving all 4 extremities. Psychiatric: Normal mood and affect.  Laboratory Data: Lab Results  Component Value Date   WBC 6.6 10/20/2024   HGB 14.0 10/20/2024   HCT 40.5 10/20/2024   MCV 85.1 10/20/2024   PLT 304 10/20/2024    Lab Results  Component Value Date   CREATININE 0.76 10/20/2024    No results found for: PSA  No results found for: TESTOSTERONE  No results found for: HGBA1C  Urinalysis    Component Value Date/Time   COLORURINE YELLOW 07/22/2020 1251   APPEARANCEUR HAZY (A) 07/22/2020 1251   LABSPEC 1.019 07/22/2020 1251   PHURINE 7.0 07/22/2020 1251   GLUCOSEU NEGATIVE 07/22/2020 1251    HGBUR NEGATIVE 07/22/2020 1251   BILIRUBINUR NEGATIVE 07/22/2020 1251   KETONESUR NEGATIVE 07/22/2020 1251   PROTEINUR NEGATIVE 07/22/2020 1251   UROBILINOGEN 0.2 10/29/2013 2311   NITRITE NEGATIVE 07/22/2020 1251   LEUKOCYTESUR SMALL (A) 07/22/2020 1251    Lab Results  Component Value Date   BACTERIA FEW (A) 07/22/2020    Pertinent Imaging: Ct a/p 2022 - images reviewed.    Assessment & Plan:    1) gross hematuria - evaluate with CT scan and cystoscopy . Discussed etiology. She asked about bladder cancer and we need to rule it out.   2) kidney stone -  as above  3) Atrophic vaginitis - can lead to dysuria and urgency. Discussed genitosyndrome menopause guideline and recommendation for topical vaginal estrogen but she would need shared decision making with her oncologist.   Add: she called with dysuria. Rec luvena or similar. Uribel .   No follow-ups on file.  Donnice Brooks, MD  [facility]  [address] [place], [place] [address] [phone]      [1]  Allergies Allergen Reactions   Lidocaine  Other (See Comments)    NUMBNESS AND TINGLING ARMS AND LEGS   "

## 2024-12-30 ENCOUNTER — Telehealth: Payer: Self-pay

## 2024-12-30 LAB — URINALYSIS, ROUTINE W REFLEX MICROSCOPIC
Bilirubin, UA: NEGATIVE
Glucose, UA: NEGATIVE
Ketones, UA: NEGATIVE
Leukocytes,UA: NEGATIVE
Nitrite, UA: NEGATIVE
Protein,UA: NEGATIVE
Specific Gravity, UA: 1.025 (ref 1.005–1.030)
Urobilinogen, Ur: 1 mg/dL (ref 0.2–1.0)
pH, UA: 6 (ref 5.0–7.5)

## 2024-12-30 LAB — MICROSCOPIC EXAMINATION: Bacteria, UA: NONE SEEN

## 2024-12-30 NOTE — Telephone Encounter (Signed)
 Patient called in today concern about blood in her urine along with burning pain and pressure. Pt was made aware that she need to get a CT done and once results come in we will forward that information to Dr. Nieves. Pt is made aware once Dr. Nieves review CT then he will proceed with the next steps that could possibly a cysto

## 2024-12-31 MED ORDER — URIBEL 118 MG PO CAPS: 1.0000 | ORAL_CAPSULE | Freq: Two times a day (BID) | ORAL | 3 refills | Status: AC | PRN

## 2024-12-31 NOTE — Addendum Note (Signed)
 Addended by: NIEVES COUGH R on: 12/31/2024 11:43 PM   Modules accepted: Orders

## 2024-12-31 NOTE — Telephone Encounter (Signed)
 Tell her to 1) try some luvena vaginal moisturizer - it is non hormonal and can help with burning. 2) Send in uribel , take one capsule BID PRN, #60. Thanks!

## 2024-12-31 NOTE — Telephone Encounter (Signed)
 Patient made aware and voiced understanding. Pt is aware waiting on MD response on Uribel  MG

## 2025-01-01 ENCOUNTER — Other Ambulatory Visit: Payer: Self-pay | Admitting: Hematology and Oncology

## 2025-01-02 NOTE — Telephone Encounter (Signed)
 Called pt made aware Rx sent to pharmacy. Voiced understanding

## 2025-01-08 ENCOUNTER — Encounter (HOSPITAL_COMMUNITY): Payer: Self-pay

## 2025-01-08 ENCOUNTER — Ambulatory Visit (HOSPITAL_COMMUNITY)

## 2025-01-12 ENCOUNTER — Telehealth: Payer: Self-pay | Admitting: Urology

## 2025-01-12 ENCOUNTER — Ambulatory Visit (HOSPITAL_COMMUNITY)

## 2025-01-12 ENCOUNTER — Encounter (HOSPITAL_COMMUNITY): Payer: Self-pay

## 2025-01-12 NOTE — Telephone Encounter (Signed)
 Patient called to see if peer to peer would be done. She has had 2 appointments for CT and they have been cancelled due to insurance review

## 2025-01-12 NOTE — Telephone Encounter (Signed)
 Appeal initiated- PA was originally initiated by scheduling staff since the providers address was not changed the referral left [facility] urology basket.  Will update patient once insurance responds.

## 2025-01-20 NOTE — Telephone Encounter (Signed)
Patient notified of approval via phone.

## 2025-01-21 ENCOUNTER — Encounter: Payer: Self-pay | Admitting: Hematology and Oncology

## 2025-01-21 ENCOUNTER — Telehealth: Payer: Self-pay

## 2025-01-21 NOTE — Progress Notes (Signed)
 Pt sent mychart message reporting severe pain in hip, legs and back that has been ongoing since her last visit w/ Dr. Gudena and has worsened in severity over the last few days. She has a hx of hysterectomy and was last told by Dr. Gudena at last visit that it could be related to decreased estrogen. She has been told by rheumatology that it is likely inflammatory or aging related. She has a abdomen/pelvic CT scan on 1/30 for hematuria. She is concerned about possible metastasis w/ the severity of her pain. She would like to go over results of CT scan and discuss options for investigating the pain. Symptom management visit scheduled with Morna NP. Pt informed and verbalized understanding.

## 2025-01-21 NOTE — Telephone Encounter (Signed)
 Patient called and reports she is experiencing excruciating sharp pains in bilateral hips, pain in her thighs and her back is now hurting. She believes it is joint and muscle pains. She has recently starting taking magnesium, she is alternating ice and heat and has taken ibuprofen  (none of which is helping). Patient was offered an appointment for next week but does not believe she can wait that long. Patient would like to know recommendations for the pain she is experiencing.

## 2025-01-22 NOTE — Telephone Encounter (Signed)
 Advised patient that per Dr. Jeannetta and Daved Holstein, PA-C: It will be hard to advise her without examining her. Especially if her symptoms have changed since Dr. Jeannetta saw her last in April 2025. Please advise her to schedule a follow up. If she is unable to wait and her symptoms are worsening, advise her to go to urgent care or the ED.  I agree, with last evaluation being 9 months ago and diagnosis was unclear I can't recommend a specific treatment plan without some kind of examination.  Patient was again offered two different appointments for next week and states she is scheduled to see her oncologist on Tuesday. She will contact us  after the oncology appointment if she needs to see us .

## 2025-01-22 NOTE — Telephone Encounter (Signed)
 I agree, with last evaluation being 9 months ago and diagnosis was unclear I can't recommend a specific treatment plan without some kind of examination.

## 2025-01-23 ENCOUNTER — Ambulatory Visit (HOSPITAL_BASED_OUTPATIENT_CLINIC_OR_DEPARTMENT_OTHER)
Admission: RE | Admit: 2025-01-23 | Discharge: 2025-01-23 | Disposition: A | Source: Ambulatory Visit | Attending: Urology | Admitting: Urology

## 2025-01-23 DIAGNOSIS — R310 Gross hematuria: Secondary | ICD-10-CM | POA: Diagnosis present

## 2025-01-23 MED ORDER — IOHEXOL 300 MG/ML  SOLN
150.0000 mL | Freq: Once | INTRAMUSCULAR | Status: AC | PRN
  Administered 2025-01-23: 150 mL via INTRAVENOUS

## 2025-01-27 ENCOUNTER — Encounter: Payer: Self-pay | Admitting: Adult Health

## 2025-01-27 ENCOUNTER — Other Ambulatory Visit: Payer: Self-pay

## 2025-01-27 ENCOUNTER — Inpatient Hospital Stay

## 2025-01-27 ENCOUNTER — Inpatient Hospital Stay: Admitting: Adult Health

## 2025-01-27 ENCOUNTER — Ambulatory Visit: Payer: Self-pay

## 2025-01-27 VITALS — BP 114/63 | HR 89 | Temp 98.0°F | Resp 17 | Wt 151.2 lb

## 2025-01-27 DIAGNOSIS — G8929 Other chronic pain: Secondary | ICD-10-CM

## 2025-01-27 DIAGNOSIS — M546 Pain in thoracic spine: Secondary | ICD-10-CM

## 2025-01-27 DIAGNOSIS — C50211 Malignant neoplasm of upper-inner quadrant of right female breast: Secondary | ICD-10-CM

## 2025-01-27 DIAGNOSIS — M545 Low back pain, unspecified: Secondary | ICD-10-CM

## 2025-01-27 DIAGNOSIS — Z171 Estrogen receptor negative status [ER-]: Secondary | ICD-10-CM

## 2025-01-28 ENCOUNTER — Telehealth: Payer: Self-pay

## 2025-01-28 NOTE — Telephone Encounter (Signed)
 Patient called and left voicemail stating she had questions about her results patient did not answer the phone I left a voicemail to call back with her questions

## 2025-03-09 ENCOUNTER — Ambulatory Visit: Admitting: Orthopedic Surgery

## 2025-03-30 ENCOUNTER — Other Ambulatory Visit: Admitting: Urology

## 2025-07-28 ENCOUNTER — Inpatient Hospital Stay

## 2025-10-20 ENCOUNTER — Inpatient Hospital Stay: Admitting: Hematology and Oncology
